# Patient Record
Sex: Female | Born: 1980 | Race: White | Hispanic: No | Marital: Single | Smoking: Never smoker
Health system: Southern US, Community
[De-identification: ages and names within clinical notes are randomized; demographics above are authoritative.]

## PROBLEM LIST (undated history)

## (undated) DIAGNOSIS — I10 Essential (primary) hypertension: Secondary | ICD-10-CM

## (undated) DIAGNOSIS — R7611 Nonspecific reaction to tuberculin skin test without active tuberculosis: Secondary | ICD-10-CM

## (undated) DIAGNOSIS — F32A Depression, unspecified: Secondary | ICD-10-CM

## (undated) DIAGNOSIS — J45909 Unspecified asthma, uncomplicated: Secondary | ICD-10-CM

## (undated) DIAGNOSIS — F329 Major depressive disorder, single episode, unspecified: Secondary | ICD-10-CM

## (undated) HISTORY — DX: Major depressive disorder, single episode, unspecified: F32.9

## (undated) HISTORY — DX: Nonspecific reaction to tuberculin skin test without active tuberculosis: R76.11

## (undated) HISTORY — DX: Unspecified asthma, uncomplicated: J45.909

## (undated) HISTORY — PX: NASAL SINUS SURGERY: SHX719

## (undated) HISTORY — DX: Depression, unspecified: F32.A

## (undated) HISTORY — DX: Essential (primary) hypertension: I10

## (undated) HISTORY — PX: LASIK: SHX215

---

## 2012-04-08 DIAGNOSIS — F419 Anxiety disorder, unspecified: Secondary | ICD-10-CM | POA: Insufficient documentation

## 2012-04-08 DIAGNOSIS — F4310 Post-traumatic stress disorder, unspecified: Secondary | ICD-10-CM | POA: Diagnosis present

## 2013-05-26 DIAGNOSIS — J342 Deviated nasal septum: Secondary | ICD-10-CM | POA: Insufficient documentation

## 2013-05-26 DIAGNOSIS — R430 Anosmia: Secondary | ICD-10-CM | POA: Insufficient documentation

## 2013-05-26 DIAGNOSIS — J3489 Other specified disorders of nose and nasal sinuses: Secondary | ICD-10-CM | POA: Insufficient documentation

## 2013-05-26 DIAGNOSIS — R0982 Postnasal drip: Secondary | ICD-10-CM | POA: Insufficient documentation

## 2013-06-25 DIAGNOSIS — J324 Chronic pansinusitis: Secondary | ICD-10-CM | POA: Insufficient documentation

## 2015-07-27 DIAGNOSIS — M47816 Spondylosis without myelopathy or radiculopathy, lumbar region: Secondary | ICD-10-CM | POA: Diagnosis not present

## 2015-07-27 DIAGNOSIS — G8929 Other chronic pain: Secondary | ICD-10-CM | POA: Diagnosis not present

## 2015-07-27 DIAGNOSIS — Z79891 Long term (current) use of opiate analgesic: Secondary | ICD-10-CM | POA: Diagnosis not present

## 2015-08-24 DIAGNOSIS — I10 Essential (primary) hypertension: Secondary | ICD-10-CM | POA: Diagnosis not present

## 2015-08-24 DIAGNOSIS — R079 Chest pain, unspecified: Secondary | ICD-10-CM | POA: Diagnosis not present

## 2015-08-24 DIAGNOSIS — R002 Palpitations: Secondary | ICD-10-CM | POA: Diagnosis not present

## 2015-09-16 DIAGNOSIS — F3340 Major depressive disorder, recurrent, in remission, unspecified: Secondary | ICD-10-CM | POA: Diagnosis not present

## 2015-09-29 DIAGNOSIS — I10 Essential (primary) hypertension: Secondary | ICD-10-CM | POA: Diagnosis not present

## 2015-09-29 DIAGNOSIS — R002 Palpitations: Secondary | ICD-10-CM | POA: Diagnosis not present

## 2015-09-30 DIAGNOSIS — I10 Essential (primary) hypertension: Secondary | ICD-10-CM | POA: Diagnosis not present

## 2015-09-30 DIAGNOSIS — J45909 Unspecified asthma, uncomplicated: Secondary | ICD-10-CM | POA: Diagnosis not present

## 2015-10-04 DIAGNOSIS — Z79891 Long term (current) use of opiate analgesic: Secondary | ICD-10-CM | POA: Diagnosis not present

## 2015-10-04 DIAGNOSIS — G8929 Other chronic pain: Secondary | ICD-10-CM | POA: Diagnosis not present

## 2015-10-04 DIAGNOSIS — M5382 Other specified dorsopathies, cervical region: Secondary | ICD-10-CM | POA: Diagnosis not present

## 2015-12-31 DIAGNOSIS — J029 Acute pharyngitis, unspecified: Secondary | ICD-10-CM | POA: Diagnosis not present

## 2016-01-31 DIAGNOSIS — Z7951 Long term (current) use of inhaled steroids: Secondary | ICD-10-CM | POA: Diagnosis not present

## 2016-01-31 DIAGNOSIS — F419 Anxiety disorder, unspecified: Secondary | ICD-10-CM | POA: Diagnosis not present

## 2016-01-31 DIAGNOSIS — J45909 Unspecified asthma, uncomplicated: Secondary | ICD-10-CM | POA: Diagnosis not present

## 2016-01-31 DIAGNOSIS — M1990 Unspecified osteoarthritis, unspecified site: Secondary | ICD-10-CM | POA: Diagnosis not present

## 2016-01-31 DIAGNOSIS — F329 Major depressive disorder, single episode, unspecified: Secondary | ICD-10-CM | POA: Diagnosis not present

## 2016-01-31 DIAGNOSIS — F4310 Post-traumatic stress disorder, unspecified: Secondary | ICD-10-CM | POA: Diagnosis not present

## 2016-01-31 DIAGNOSIS — Z87820 Personal history of traumatic brain injury: Secondary | ICD-10-CM | POA: Diagnosis not present

## 2016-01-31 DIAGNOSIS — Z91040 Latex allergy status: Secondary | ICD-10-CM | POA: Diagnosis not present

## 2016-01-31 DIAGNOSIS — R05 Cough: Secondary | ICD-10-CM | POA: Diagnosis not present

## 2016-01-31 DIAGNOSIS — Z79899 Other long term (current) drug therapy: Secondary | ICD-10-CM | POA: Diagnosis not present

## 2016-01-31 DIAGNOSIS — G8929 Other chronic pain: Secondary | ICD-10-CM | POA: Diagnosis not present

## 2016-01-31 DIAGNOSIS — M542 Cervicalgia: Secondary | ICD-10-CM | POA: Diagnosis not present

## 2016-01-31 DIAGNOSIS — I10 Essential (primary) hypertension: Secondary | ICD-10-CM | POA: Diagnosis not present

## 2016-01-31 DIAGNOSIS — J209 Acute bronchitis, unspecified: Secondary | ICD-10-CM | POA: Diagnosis not present

## 2016-01-31 DIAGNOSIS — M549 Dorsalgia, unspecified: Secondary | ICD-10-CM | POA: Diagnosis not present

## 2016-06-07 DIAGNOSIS — J029 Acute pharyngitis, unspecified: Secondary | ICD-10-CM | POA: Diagnosis not present

## 2016-08-16 DIAGNOSIS — J9601 Acute respiratory failure with hypoxia: Secondary | ICD-10-CM | POA: Diagnosis not present

## 2016-08-16 DIAGNOSIS — F419 Anxiety disorder, unspecified: Secondary | ICD-10-CM | POA: Diagnosis not present

## 2016-08-16 DIAGNOSIS — G92 Toxic encephalopathy: Secondary | ICD-10-CM | POA: Diagnosis not present

## 2016-08-16 DIAGNOSIS — G9340 Encephalopathy, unspecified: Secondary | ICD-10-CM | POA: Diagnosis not present

## 2016-08-16 DIAGNOSIS — G9341 Metabolic encephalopathy: Secondary | ICD-10-CM | POA: Diagnosis not present

## 2016-08-16 DIAGNOSIS — T493X1A Poisoning by emollients, demulcents and protectants, accidental (unintentional), initial encounter: Secondary | ICD-10-CM | POA: Diagnosis not present

## 2016-08-16 DIAGNOSIS — R4020 Unspecified coma: Secondary | ICD-10-CM | POA: Diagnosis not present

## 2016-08-16 DIAGNOSIS — F1020 Alcohol dependence, uncomplicated: Secondary | ICD-10-CM | POA: Diagnosis not present

## 2016-08-16 DIAGNOSIS — T40692A Poisoning by other narcotics, intentional self-harm, initial encounter: Secondary | ICD-10-CM | POA: Diagnosis not present

## 2016-08-16 DIAGNOSIS — Z915 Personal history of self-harm: Secondary | ICD-10-CM | POA: Diagnosis not present

## 2016-08-16 DIAGNOSIS — R451 Restlessness and agitation: Secondary | ICD-10-CM | POA: Diagnosis not present

## 2016-08-16 DIAGNOSIS — T40602A Poisoning by unspecified narcotics, intentional self-harm, initial encounter: Secondary | ICD-10-CM | POA: Diagnosis not present

## 2016-08-16 DIAGNOSIS — R000 Tachycardia, unspecified: Secondary | ICD-10-CM | POA: Diagnosis not present

## 2016-08-16 DIAGNOSIS — F1420 Cocaine dependence, uncomplicated: Secondary | ICD-10-CM | POA: Diagnosis not present

## 2016-08-16 DIAGNOSIS — T50904A Poisoning by unspecified drugs, medicaments and biological substances, undetermined, initial encounter: Secondary | ICD-10-CM | POA: Diagnosis not present

## 2016-08-16 DIAGNOSIS — I10 Essential (primary) hypertension: Secondary | ICD-10-CM | POA: Diagnosis not present

## 2016-08-16 DIAGNOSIS — J324 Chronic pansinusitis: Secondary | ICD-10-CM | POA: Diagnosis not present

## 2016-08-16 DIAGNOSIS — T405X1A Poisoning by cocaine, accidental (unintentional), initial encounter: Secondary | ICD-10-CM | POA: Diagnosis not present

## 2016-08-16 DIAGNOSIS — Z430 Encounter for attention to tracheostomy: Secondary | ICD-10-CM | POA: Diagnosis not present

## 2016-08-16 DIAGNOSIS — T50991A Poisoning by other drugs, medicaments and biological substances, accidental (unintentional), initial encounter: Secondary | ICD-10-CM | POA: Diagnosis not present

## 2016-08-16 DIAGNOSIS — J4520 Mild intermittent asthma, uncomplicated: Secondary | ICD-10-CM | POA: Diagnosis not present

## 2016-08-16 DIAGNOSIS — F10239 Alcohol dependence with withdrawal, unspecified: Secondary | ICD-10-CM | POA: Diagnosis not present

## 2016-08-16 DIAGNOSIS — T510X1A Toxic effect of ethanol, accidental (unintentional), initial encounter: Secondary | ICD-10-CM | POA: Diagnosis not present

## 2016-08-16 DIAGNOSIS — I998 Other disorder of circulatory system: Secondary | ICD-10-CM | POA: Diagnosis not present

## 2016-08-16 DIAGNOSIS — T50994A Poisoning by other drugs, medicaments and biological substances, undetermined, initial encounter: Secondary | ICD-10-CM | POA: Diagnosis not present

## 2016-08-16 DIAGNOSIS — G931 Anoxic brain damage, not elsewhere classified: Secondary | ICD-10-CM | POA: Diagnosis not present

## 2016-08-16 DIAGNOSIS — J9602 Acute respiratory failure with hypercapnia: Secondary | ICD-10-CM | POA: Diagnosis not present

## 2016-08-16 DIAGNOSIS — T507X4A Poisoning by analeptics and opioid receptor antagonists, undetermined, initial encounter: Secondary | ICD-10-CM | POA: Diagnosis not present

## 2016-08-16 DIAGNOSIS — F332 Major depressive disorder, recurrent severe without psychotic features: Secondary | ICD-10-CM | POA: Diagnosis not present

## 2016-08-16 DIAGNOSIS — F4310 Post-traumatic stress disorder, unspecified: Secondary | ICD-10-CM | POA: Diagnosis not present

## 2016-08-17 DIAGNOSIS — T40602A Poisoning by unspecified narcotics, intentional self-harm, initial encounter: Secondary | ICD-10-CM | POA: Diagnosis not present

## 2016-08-17 DIAGNOSIS — J4520 Mild intermittent asthma, uncomplicated: Secondary | ICD-10-CM | POA: Diagnosis not present

## 2016-08-17 DIAGNOSIS — F4310 Post-traumatic stress disorder, unspecified: Secondary | ICD-10-CM | POA: Diagnosis not present

## 2016-08-17 DIAGNOSIS — T50994A Poisoning by other drugs, medicaments and biological substances, undetermined, initial encounter: Secondary | ICD-10-CM | POA: Diagnosis not present

## 2016-08-17 DIAGNOSIS — J9601 Acute respiratory failure with hypoxia: Secondary | ICD-10-CM | POA: Diagnosis not present

## 2016-08-17 DIAGNOSIS — R000 Tachycardia, unspecified: Secondary | ICD-10-CM | POA: Diagnosis not present

## 2016-08-17 DIAGNOSIS — I998 Other disorder of circulatory system: Secondary | ICD-10-CM | POA: Diagnosis not present

## 2016-08-17 DIAGNOSIS — J9602 Acute respiratory failure with hypercapnia: Secondary | ICD-10-CM | POA: Diagnosis not present

## 2016-08-17 DIAGNOSIS — F1020 Alcohol dependence, uncomplicated: Secondary | ICD-10-CM | POA: Diagnosis not present

## 2016-08-17 DIAGNOSIS — R451 Restlessness and agitation: Secondary | ICD-10-CM | POA: Diagnosis not present

## 2016-08-17 DIAGNOSIS — Z915 Personal history of self-harm: Secondary | ICD-10-CM | POA: Diagnosis not present

## 2016-08-17 DIAGNOSIS — F1420 Cocaine dependence, uncomplicated: Secondary | ICD-10-CM | POA: Diagnosis not present

## 2016-08-17 DIAGNOSIS — F332 Major depressive disorder, recurrent severe without psychotic features: Secondary | ICD-10-CM | POA: Diagnosis not present

## 2016-08-18 DIAGNOSIS — F4310 Post-traumatic stress disorder, unspecified: Secondary | ICD-10-CM | POA: Diagnosis not present

## 2016-08-18 DIAGNOSIS — J9602 Acute respiratory failure with hypercapnia: Secondary | ICD-10-CM | POA: Diagnosis not present

## 2016-08-18 DIAGNOSIS — F1420 Cocaine dependence, uncomplicated: Secondary | ICD-10-CM | POA: Diagnosis not present

## 2016-08-18 DIAGNOSIS — T40602A Poisoning by unspecified narcotics, intentional self-harm, initial encounter: Secondary | ICD-10-CM | POA: Diagnosis not present

## 2016-08-18 DIAGNOSIS — F332 Major depressive disorder, recurrent severe without psychotic features: Secondary | ICD-10-CM | POA: Diagnosis not present

## 2016-08-18 DIAGNOSIS — J9601 Acute respiratory failure with hypoxia: Secondary | ICD-10-CM | POA: Diagnosis not present

## 2016-08-18 DIAGNOSIS — F1020 Alcohol dependence, uncomplicated: Secondary | ICD-10-CM | POA: Diagnosis not present

## 2016-08-19 DIAGNOSIS — J9602 Acute respiratory failure with hypercapnia: Secondary | ICD-10-CM | POA: Diagnosis not present

## 2016-08-19 DIAGNOSIS — T40602A Poisoning by unspecified narcotics, intentional self-harm, initial encounter: Secondary | ICD-10-CM | POA: Diagnosis not present

## 2016-08-19 DIAGNOSIS — J9601 Acute respiratory failure with hypoxia: Secondary | ICD-10-CM | POA: Diagnosis not present

## 2016-08-19 DIAGNOSIS — F4310 Post-traumatic stress disorder, unspecified: Secondary | ICD-10-CM | POA: Diagnosis not present

## 2016-08-20 DIAGNOSIS — G9341 Metabolic encephalopathy: Secondary | ICD-10-CM | POA: Diagnosis not present

## 2016-08-20 DIAGNOSIS — T50904A Poisoning by unspecified drugs, medicaments and biological substances, undetermined, initial encounter: Secondary | ICD-10-CM | POA: Diagnosis not present

## 2016-08-20 DIAGNOSIS — F1420 Cocaine dependence, uncomplicated: Secondary | ICD-10-CM | POA: Diagnosis not present

## 2016-08-20 DIAGNOSIS — F10239 Alcohol dependence with withdrawal, unspecified: Secondary | ICD-10-CM | POA: Diagnosis not present

## 2016-08-21 DIAGNOSIS — F1020 Alcohol dependence, uncomplicated: Secondary | ICD-10-CM | POA: Diagnosis not present

## 2016-08-21 DIAGNOSIS — J45909 Unspecified asthma, uncomplicated: Secondary | ICD-10-CM | POA: Diagnosis not present

## 2016-08-21 DIAGNOSIS — F1420 Cocaine dependence, uncomplicated: Secondary | ICD-10-CM | POA: Diagnosis not present

## 2016-08-21 DIAGNOSIS — G9340 Encephalopathy, unspecified: Secondary | ICD-10-CM | POA: Diagnosis not present

## 2016-08-21 DIAGNOSIS — T50902A Poisoning by unspecified drugs, medicaments and biological substances, intentional self-harm, initial encounter: Secondary | ICD-10-CM | POA: Diagnosis not present

## 2016-08-21 DIAGNOSIS — T50904A Poisoning by unspecified drugs, medicaments and biological substances, undetermined, initial encounter: Secondary | ICD-10-CM | POA: Diagnosis not present

## 2016-08-21 DIAGNOSIS — F4310 Post-traumatic stress disorder, unspecified: Secondary | ICD-10-CM | POA: Diagnosis not present

## 2016-08-21 DIAGNOSIS — F1010 Alcohol abuse, uncomplicated: Secondary | ICD-10-CM | POA: Diagnosis not present

## 2016-08-21 DIAGNOSIS — F332 Major depressive disorder, recurrent severe without psychotic features: Secondary | ICD-10-CM | POA: Diagnosis not present

## 2016-08-22 DIAGNOSIS — F4310 Post-traumatic stress disorder, unspecified: Secondary | ICD-10-CM | POA: Diagnosis not present

## 2016-08-22 DIAGNOSIS — F1420 Cocaine dependence, uncomplicated: Secondary | ICD-10-CM | POA: Diagnosis not present

## 2016-08-22 DIAGNOSIS — F332 Major depressive disorder, recurrent severe without psychotic features: Secondary | ICD-10-CM | POA: Diagnosis not present

## 2016-08-22 DIAGNOSIS — F1020 Alcohol dependence, uncomplicated: Secondary | ICD-10-CM | POA: Diagnosis not present

## 2016-08-23 DIAGNOSIS — F332 Major depressive disorder, recurrent severe without psychotic features: Secondary | ICD-10-CM | POA: Diagnosis not present

## 2016-08-23 DIAGNOSIS — F4310 Post-traumatic stress disorder, unspecified: Secondary | ICD-10-CM | POA: Diagnosis not present

## 2016-08-23 DIAGNOSIS — F1020 Alcohol dependence, uncomplicated: Secondary | ICD-10-CM | POA: Diagnosis not present

## 2016-08-23 DIAGNOSIS — F1420 Cocaine dependence, uncomplicated: Secondary | ICD-10-CM | POA: Diagnosis not present

## 2016-08-24 DIAGNOSIS — F1020 Alcohol dependence, uncomplicated: Secondary | ICD-10-CM | POA: Diagnosis not present

## 2016-08-24 DIAGNOSIS — F332 Major depressive disorder, recurrent severe without psychotic features: Secondary | ICD-10-CM | POA: Diagnosis not present

## 2016-08-24 DIAGNOSIS — F4310 Post-traumatic stress disorder, unspecified: Secondary | ICD-10-CM | POA: Diagnosis not present

## 2016-08-24 DIAGNOSIS — F1420 Cocaine dependence, uncomplicated: Secondary | ICD-10-CM | POA: Diagnosis not present

## 2016-08-25 DIAGNOSIS — F4310 Post-traumatic stress disorder, unspecified: Secondary | ICD-10-CM | POA: Diagnosis not present

## 2016-08-25 DIAGNOSIS — F1420 Cocaine dependence, uncomplicated: Secondary | ICD-10-CM | POA: Diagnosis not present

## 2016-08-25 DIAGNOSIS — F1020 Alcohol dependence, uncomplicated: Secondary | ICD-10-CM | POA: Diagnosis not present

## 2016-08-25 DIAGNOSIS — F332 Major depressive disorder, recurrent severe without psychotic features: Secondary | ICD-10-CM | POA: Diagnosis not present

## 2016-08-30 ENCOUNTER — Encounter: Payer: Self-pay | Admitting: Family

## 2016-08-30 ENCOUNTER — Ambulatory Visit (INDEPENDENT_AMBULATORY_CARE_PROVIDER_SITE_OTHER): Payer: Federal, State, Local not specified - PPO | Admitting: Family

## 2016-08-30 VITALS — BP 120/84 | HR 105 | Temp 98.3°F | Resp 16 | Ht 67.0 in | Wt 137.0 lb

## 2016-08-30 DIAGNOSIS — F324 Major depressive disorder, single episode, in partial remission: Secondary | ICD-10-CM | POA: Insufficient documentation

## 2016-08-30 DIAGNOSIS — I10 Essential (primary) hypertension: Secondary | ICD-10-CM

## 2016-08-30 DIAGNOSIS — F3341 Major depressive disorder, recurrent, in partial remission: Secondary | ICD-10-CM

## 2016-08-30 DIAGNOSIS — J4540 Moderate persistent asthma, uncomplicated: Secondary | ICD-10-CM

## 2016-08-30 MED ORDER — DULOXETINE HCL 60 MG PO CPEP: 60.0000 mg | ORAL_CAPSULE | Freq: Every day | ORAL | 0 refills | Status: DC

## 2016-08-30 MED ORDER — TRAZODONE HCL 50 MG PO TABS: 50.0000 mg | ORAL_TABLET | Freq: Every evening | ORAL | 0 refills | Status: DC | PRN

## 2016-08-30 MED ORDER — BUPROPION HCL ER (XL) 150 MG PO TB24: 150.0000 mg | ORAL_TABLET | Freq: Every day | ORAL | 0 refills | Status: DC

## 2016-08-30 MED ORDER — BUDESONIDE-FORMOTEROL FUMARATE 160-4.5 MCG/ACT IN AERO: 2.0000 | INHALATION_SPRAY | Freq: Two times a day (BID) | RESPIRATORY_TRACT | 3 refills | Status: DC

## 2016-08-30 MED ORDER — METOPROLOL SUCCINATE ER 25 MG PO TB24: 25.0000 mg | ORAL_TABLET | Freq: Every day | ORAL | 0 refills | Status: DC

## 2016-08-30 MED ORDER — PRAZOSIN HCL 2 MG PO CAPS: 2.0000 mg | ORAL_CAPSULE | Freq: Every day | ORAL | 0 refills | Status: DC

## 2016-08-30 NOTE — Patient Instructions (Signed)
Thank you for choosing [facility].  SUMMARY AND INSTRUCTIONS:  Please continue to take your medications as prescribed.   Schedule a time for your annual wellness exam.  Continue to monitor your blood pressures at home with the goal of average blood pressure less than 140/90.   Medication:  Your prescription(s) have been submitted to your pharmacy or been printed and provided for you. Please take as directed and contact our office if you believe you are having problem(s) with the medication(s) or have any questions.  Follow up:  If your symptoms worsen or fail to improve, please contact our office for further instruction, or in case of emergency go directly to the emergency room at the closest medical facility.

## 2016-08-30 NOTE — Assessment & Plan Note (Signed)
Major depression appears to be a partial remission following suicide attempt through overdose during most recent hospitalization. 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 No suicidal ideations or plans. Denies any hallucinations. Nightmares/dreams are adequately controlled with current dosage of Minipress. Continue current dosage of bupropion and duloxetine. Declines psychology at this time. Continue current dosage of trazodone as needed for sleep. Follow-up in 3 months or sooner if needed.

## 2016-08-30 NOTE — Assessment & Plan Note (Signed)
Blood pressure appears adequately controlled without metoprolol, however heart rate noted to be elevated. Most likely covered through prazosin to help control blood pressure. Continue current dosage of metoprolol for heart rate control as well as blood pressure. Encouraged to monitor blood pressure at home and follow low-sodium diet. Denies worst headache of life with no symptoms of end organ damage noted on physical exam.

## 2016-08-30 NOTE — Assessment & Plan Note (Signed)
Asthma stable with current medication regimen and no symptoms of exacerbation or recent flares. Continue current dosage of Symbicort and Singulair. Continue albuterol as needed. Follow-up if symptoms worsen or no longer well controlled.

## 2016-08-30 NOTE — Progress Notes (Signed)
Subjective:    Patient ID: Jordan Foster, female    DOB: February 24, 1981, 36 y.o.   MRN: [id number]  Chief Complaint  Patient presents with  . Establish Care    BP medication refills     HPI:  Jordan Foster is a 36 y.o. female who  has a past medical history of Asthma; Depression; Hypertension; and Positive TB test. and presents today for an office visit to establish care.   1.) Hypertension - Currently prescribed metoprolol. Has been out of the medication for the last five days since leaving the hospital. Denies worst headache of life or symptoms of end organ damage. Blood pressures at home have been well controlled without medication. Working on a low sodium intake.   BP Readings from Last 3 Encounters:  08/30/16 120/84     2.) Major Depressive Disorder - Currently managed with Cymbalta, bupropion, prazosin, and trazodone. Reports taking the medications as prescribed and denies adverse side effects. No current suicidal ideations although recently hospitalized for suicidal attempt by drug overdose. Remained sober since discharge from the hospital from alcohol and drugs. Mood is stable with current medication regimen and not currently followed by psychiatry.  3.) Asthma  - currently maintained on Symbicort, albuterol, and Singulair. Reports taking the medication as prescribed and denies adverse side effects. No current symptoms of exacerbation. No recent nighttime awakenings or need for oral steroids. Uses the albuterol inhaler approximately one time per month. Symptoms generally well controlled with current medication regimen. No shortness of breath, wheezing, or chest tightness.    Allergies  Allergen Reactions  . Latex       No outpatient prescriptions prior to visit.   No facility-administered medications prior to visit.      Past Medical History:  Diagnosis Date  . Asthma   . Depression   . Hypertension   . Positive TB test       Past Surgical  History:  Procedure Laterality Date  . LASIK    . NASAL SINUS SURGERY        Family History  Problem Relation Age of Onset  . Hypertension Mother       Social History   Social History  . Marital status: Single    Spouse name: N/A  . Number of children: 0  . Years of education: 4216   Occupational History  . [employer]     Social History Main Topics  . Smoking status: Never Smoker  . Smokeless tobacco: Never Used  . Alcohol use No  . Drug use: No  . Sexual activity: Not on file   Other Topics Concern  . Not on file   Social History Narrative   Fun/Hobby: Working to determine      Review of Systems  Constitutional: Negative for chills and fever.  Eyes:       Negative for changes in vision  Respiratory: Negative for cough, chest tightness, shortness of breath and wheezing.   Cardiovascular: Negative for chest pain, palpitations and leg swelling.  Neurological: Negative for dizziness, weakness and light-headedness.  Psychiatric/Behavioral: Negative for agitation, behavioral problems, decreased concentration, dysphoric mood, hallucinations, self-injury, sleep disturbance and suicidal ideas. The patient is not nervous/anxious and is not hyperactive.        Objective:    BP 120/84 (BP Location: Left Arm, Patient Position: Sitting, Cuff Size: Normal)   Pulse (!) 105   Temp 98.3 F (36.8 C) (Oral)   Resp 16   Ht 5\' 7"  (  1.702 m)   Wt 137 lb (62.1 kg)   SpO2 98%   BMI 21.46 kg/m  Nursing note and vital signs reviewed.  Physical Exam  Constitutional: She is oriented to person, place, and time. She appears well-developed and well-nourished. No distress.  Cardiovascular: Normal rate, regular rhythm, normal heart sounds and intact distal pulses.   Pulmonary/Chest: Effort normal and breath sounds normal.  Neurological: She is alert and oriented to person, place, and time.  Skin: Skin is warm and dry.  Psychiatric: She has a normal mood and affect. Her  behavior is normal. Judgment and thought content normal.        Assessment & Plan:   Problem List Items Addressed This Visit      Cardiovascular and Mediastinum   Hypertension - Primary    Blood pressure appears adequately controlled without metoprolol, however heart rate noted to be elevated. Most likely covered through prazosin to help control blood pressure. Continue current dosage of metoprolol for heart rate control as well as blood pressure. Encouraged to monitor blood pressure at home and follow low-sodium diet. Denies worst headache of life with no symptoms of end organ damage noted on physical exam.      Relevant Medications   prazosin (MINIPRESS) 2 MG capsule   metoprolol succinate (TOPROL-XL) 25 MG 24 hr tablet     Respiratory   Moderate persistent asthma without complication    Asthma stable with current medication regimen and no symptoms of exacerbation or recent flares. Continue current dosage of Symbicort and Singulair. Continue albuterol as needed. Follow-up if symptoms worsen or no longer well controlled.      Relevant Medications   albuterol (PROVENTIL HFA;VENTOLIN HFA) 108 (90 Base) MCG/ACT inhaler   montelukast (SINGULAIR) 10 MG tablet   budesonide-formoterol (SYMBICORT) 160-4.5 MCG/ACT inhaler     Other   Major depression in partial remission (HCC)    Major depression appears to be a partial remission following suicide attempt through overdose during most recent hospitalization. 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 No suicidal ideations or plans. Denies any hallucinations. Nightmares/dreams are adequately controlled with current dosage of Minipress. Continue current dosage of bupropion and duloxetine. Declines psychology at this time. Continue current dosage of trazodone as needed for sleep. Follow-up in 3 months or sooner if needed.      Relevant Medications   traZODone (DESYREL) 50 MG tablet   buPROPion (WELLBUTRIN XL) 150 MG 24 hr tablet    DULoxetine (CYMBALTA) 60 MG capsule       I have discontinued Ms. Goates's hydrochlorothiazide. I have also changed her buPROPion, prazosin, DULoxetine, and metoprolol succinate. Additionally, I am having her start on budesonide-formoterol and traZODone. Lastly, I am having her maintain her albuterol, cetirizine, Vitamin D3, and montelukast.   Meds ordered this encounter  Medications  . DISCONTD: buPROPion (WELLBUTRIN XL) 150 MG 24 hr tablet    Sig: Take 150 mg by mouth daily.  Marland Kitchen DISCONTD: prazosin (MINIPRESS) 2 MG capsule    Sig: Take 2 mg by mouth at bedtime.  Marland Kitchen DISCONTD: DULoxetine (CYMBALTA) 60 MG capsule    Sig: Take 60 mg by mouth daily.  Marland Kitchen DISCONTD: metoprolol succinate (TOPROL-XL) 25 MG 24 hr tablet    Sig: Take 25 mg by mouth daily.  Marland Kitchen albuterol (PROVENTIL HFA;VENTOLIN HFA) 108 (90 Base) MCG/ACT inhaler    Sig: Inhale into the lungs every 6 (six) hours as needed for wheezing or shortness of breath.  . cetirizine (ZYRTEC) 10 MG tablet  Sig: Take 10 mg by mouth daily.  . Cholecalciferol (VITAMIN D3) 1000 units CAPS    Sig: Take by mouth.  . DISCONTD: hydrochlorothiazide (HYDRODIURIL) 25 MG tablet    Sig: Take 25 mg by mouth daily.  . montelukast (SINGULAIR) 10 MG tablet    Sig: Take 10 mg by mouth at bedtime.  . budesonide-formoterol (SYMBICORT) 160-4.5 MCG/ACT inhaler    Sig: Inhale 2 puffs into the lungs 2 (two) times daily.    Dispense:  1 Inhaler    Refill:  3    Order Specific Question:   Supervising Provider    Answer:   Hillard Danker A [[id number]]  . traZODone (DESYREL) 50 MG tablet    Sig: Take 1 tablet (50 mg total) by mouth at bedtime as needed for sleep.    Dispense:  90 tablet    Refill:  0    Order Specific Question:   Supervising Provider    Answer:   Hillard Danker A [[id number]]  . buPROPion (WELLBUTRIN XL) 150 MG 24 hr tablet    Sig: Take 1 tablet (150 mg total) by mouth daily.    Dispense:  90 tablet    Refill:  0    Order Specific Question:    Supervising Provider    Answer:   Hillard Danker A [[id number]]  . prazosin (MINIPRESS) 2 MG capsule    Sig: Take 1 capsule (2 mg total) by mouth at bedtime.    Dispense:  90 capsule    Refill:  0    Order Specific Question:   Supervising Provider    Answer:   Hillard Danker A [[id number]]  . DULoxetine (CYMBALTA) 60 MG capsule    Sig: Take 1 capsule (60 mg total) by mouth daily.    Dispense:  90 capsule    Refill:  0    Order Specific Question:   Supervising Provider    Answer:   Hillard Danker A [[id number]]  . metoprolol succinate (TOPROL-XL) 25 MG 24 hr tablet    Sig: Take 1 tablet (25 mg total) by mouth daily.    Dispense:  90 tablet    Refill:  0    Order Specific Question:   Supervising Provider    Answer:   Hillard Danker A [[id number]]     Follow-up: Return in about 3 months (around 11/30/2016), or if symptoms worsen or fail to improve.  Jeanine Luz, FNP

## 2016-09-22 ENCOUNTER — Ambulatory Visit (INDEPENDENT_AMBULATORY_CARE_PROVIDER_SITE_OTHER): Payer: Federal, State, Local not specified - PPO | Admitting: Family

## 2016-09-22 ENCOUNTER — Encounter: Payer: Self-pay | Admitting: Family

## 2016-09-22 VITALS — BP 102/70 | HR 75 | Temp 97.6°F | Resp 16 | Ht 67.0 in | Wt 133.0 lb

## 2016-09-22 DIAGNOSIS — F3341 Major depressive disorder, recurrent, in partial remission: Secondary | ICD-10-CM | POA: Diagnosis not present

## 2016-09-22 MED ORDER — DULOXETINE HCL 30 MG PO CPEP: 30.0000 mg | ORAL_CAPSULE | Freq: Every day | ORAL | 0 refills | Status: DC

## 2016-09-22 NOTE — Assessment & Plan Note (Signed)
Symptoms of depression remain adequately controlled with no symptoms of suicidal ideations although room for improvement with current medication regimen. Increase Cymbalta. Continue current dosage of bupropion and trazodone. Follow-up in 3 months or sooner if needed.

## 2016-09-22 NOTE — Patient Instructions (Signed)
Thank you for choosing [facility].  SUMMARY AND INSTRUCTIONS:  Please increase the Cymbalta to 90 mg daily.  Continue to take your other medications as prescribed.   Medication:  Your prescription(s) have been submitted to your pharmacy or been printed and provided for you. Please take as directed and contact our office if you believe you are having problem(s) with the medication(s) or have any questions.  Follow up:  If your symptoms worsen or fail to improve, please contact our office for further instruction, or in case of emergency go directly to the emergency room at the closest medical facility.

## 2016-09-22 NOTE — Progress Notes (Signed)
Subjective:    Patient ID: Jordan Foster, female    DOB: 1980/10/23, 36 y.o.   MRN: [id number]  Chief Complaint  Patient presents with  . Follow-up    would like to increase cymbalta     HPI:  Jordan Foster is a 36 y.o. female who  has a past medical history of Asthma; Depression; Hypertension; and Positive TB test. and presents today for a follow up office visit.  Depression - Currently maintained on bupropion and duloxetine. Reports taken the medications as prescribed and denies adverse side effects. Symptoms have been improved since she was hospitalized, but feels there is some room for improvement. Sleeping with okay with the trazodone and getting 4-6 hours per night. No suicidal ideations. Does have a counselor follow up through the [place].     Allergies  Allergen Reactions  . Latex       Outpatient Medications Prior to Visit  Medication Sig Dispense Refill  . albuterol (PROVENTIL HFA;VENTOLIN HFA) 108 (90 Base) MCG/ACT inhaler Inhale into the lungs every 6 (six) hours as needed for wheezing or shortness of breath.    . budesonide-formoterol (SYMBICORT) 160-4.5 MCG/ACT inhaler Inhale 2 puffs into the lungs 2 (two) times daily. 1 Inhaler 3  . buPROPion (WELLBUTRIN XL) 150 MG 24 hr tablet Take 1 tablet (150 mg total) by mouth daily. 90 tablet 0  . cetirizine (ZYRTEC) 10 MG tablet Take 10 mg by mouth daily.    . Cholecalciferol (VITAMIN D3) 1000 units CAPS Take by mouth.    . DULoxetine (CYMBALTA) 60 MG capsule Take 1 capsule (60 mg total) by mouth daily. 90 capsule 0  . metoprolol succinate (TOPROL-XL) 25 MG 24 hr tablet Take 1 tablet (25 mg total) by mouth daily. 90 tablet 0  . montelukast (SINGULAIR) 10 MG tablet Take 10 mg by mouth at bedtime.    . prazosin (MINIPRESS) 2 MG capsule Take 1 capsule (2 mg total) by mouth at bedtime. 90 capsule 0  . traZODone (DESYREL) 50 MG tablet Take 1 tablet (50 mg total) by mouth at bedtime as needed for sleep. 90 tablet 0    No facility-administered medications prior to visit.     Review of Systems  Constitutional: Negative for chills and fever.  Respiratory: Negative for chest tightness and shortness of breath.   Psychiatric/Behavioral: Positive for dysphoric mood. Negative for behavioral problems, decreased concentration, hallucinations, self-injury, sleep disturbance and suicidal ideas. The patient is not nervous/anxious.       Objective:    BP 102/70 (BP Location: Left Arm, Patient Position: Sitting, Cuff Size: Normal)   Pulse 75   Temp 97.6 F (36.4 C) (Oral)   Resp 16   Ht 5\' 7"  (1.702 m)   Wt 133 lb (60.3 kg)   SpO2 98%   BMI 20.83 kg/m  Nursing note and vital signs reviewed.  Physical Exam  Constitutional: She is oriented to person, place, and time. She appears well-developed and well-nourished. No distress.  Cardiovascular: Normal rate, regular rhythm, normal heart sounds and intact distal pulses.   Pulmonary/Chest: Effort normal and breath sounds normal.  Neurological: She is alert and oriented to person, place, and time.  Skin: Skin is warm and dry.  Psychiatric: She has a normal mood and affect. Her behavior is normal. Judgment and thought content normal.       Assessment & Plan:   Problem List Items Addressed This Visit      Other   Major depression in partial remission (  HCC) - Primary    Symptoms of depression remain adequately controlled with no symptoms of suicidal ideations although room for improvement with current medication regimen. Increase Cymbalta. Continue current dosage of bupropion and trazodone. Follow-up in 3 months or sooner if needed.      Relevant Medications   DULoxetine (CYMBALTA) 30 MG capsule       I am having Ms. Jordan Foster start on DULoxetine. I am also having her maintain her albuterol, cetirizine, Vitamin D3, montelukast, budesonide-formoterol, traZODone, buPROPion, prazosin, DULoxetine, and metoprolol succinate.   Meds ordered this encounter   Medications  . DULoxetine (CYMBALTA) 30 MG capsule    Sig: Take 1 capsule (30 mg total) by mouth daily.    Dispense:  90 capsule    Refill:  0    Order Specific Question:   Supervising Provider    Answer:   Hillard DankerRAWFORD, ELIZABETH A [[id number]]     Follow-up: Return in about 3 months (around 12/23/2016), or if symptoms worsen or fail to improve.  Jeanine Luzalone, Gregory, FNP

## 2016-09-27 DIAGNOSIS — M546 Pain in thoracic spine: Secondary | ICD-10-CM | POA: Diagnosis not present

## 2016-09-27 DIAGNOSIS — M62830 Muscle spasm of back: Secondary | ICD-10-CM | POA: Diagnosis not present

## 2016-09-27 DIAGNOSIS — G44209 Tension-type headache, unspecified, not intractable: Secondary | ICD-10-CM | POA: Diagnosis not present

## 2016-09-27 DIAGNOSIS — M542 Cervicalgia: Secondary | ICD-10-CM | POA: Diagnosis not present

## 2016-10-02 DIAGNOSIS — M542 Cervicalgia: Secondary | ICD-10-CM | POA: Diagnosis not present

## 2016-10-02 DIAGNOSIS — G44209 Tension-type headache, unspecified, not intractable: Secondary | ICD-10-CM | POA: Diagnosis not present

## 2016-10-02 DIAGNOSIS — M546 Pain in thoracic spine: Secondary | ICD-10-CM | POA: Diagnosis not present

## 2016-10-02 DIAGNOSIS — M62830 Muscle spasm of back: Secondary | ICD-10-CM | POA: Diagnosis not present

## 2016-10-03 DIAGNOSIS — G44209 Tension-type headache, unspecified, not intractable: Secondary | ICD-10-CM | POA: Diagnosis not present

## 2016-10-03 DIAGNOSIS — M62830 Muscle spasm of back: Secondary | ICD-10-CM | POA: Diagnosis not present

## 2016-10-03 DIAGNOSIS — M546 Pain in thoracic spine: Secondary | ICD-10-CM | POA: Diagnosis not present

## 2016-10-03 DIAGNOSIS — M542 Cervicalgia: Secondary | ICD-10-CM | POA: Diagnosis not present

## 2016-11-08 ENCOUNTER — Other Ambulatory Visit: Payer: Self-pay | Admitting: Family

## 2016-11-22 DIAGNOSIS — R000 Tachycardia, unspecified: Secondary | ICD-10-CM | POA: Diagnosis not present

## 2016-11-22 DIAGNOSIS — J020 Streptococcal pharyngitis: Secondary | ICD-10-CM | POA: Diagnosis not present

## 2016-11-25 ENCOUNTER — Other Ambulatory Visit: Payer: Self-pay | Admitting: Family

## 2016-11-29 ENCOUNTER — Ambulatory Visit (INDEPENDENT_AMBULATORY_CARE_PROVIDER_SITE_OTHER): Payer: Federal, State, Local not specified - PPO | Admitting: Family

## 2016-11-29 ENCOUNTER — Encounter: Payer: Self-pay | Admitting: Family

## 2016-11-29 VITALS — BP 128/82 | HR 117 | Resp 16 | Ht 67.0 in | Wt 129.0 lb

## 2016-11-29 DIAGNOSIS — R200 Anesthesia of skin: Secondary | ICD-10-CM | POA: Diagnosis not present

## 2016-11-29 DIAGNOSIS — R202 Paresthesia of skin: Secondary | ICD-10-CM | POA: Diagnosis not present

## 2016-11-29 DIAGNOSIS — F3341 Major depressive disorder, recurrent, in partial remission: Secondary | ICD-10-CM | POA: Diagnosis not present

## 2016-11-29 MED ORDER — METOPROLOL SUCCINATE ER 25 MG PO TB24: 25.0000 mg | ORAL_TABLET | Freq: Every day | ORAL | 0 refills | Status: DC

## 2016-11-29 MED ORDER — DULOXETINE HCL 60 MG PO CPEP: 120.0000 mg | ORAL_CAPSULE | Freq: Every day | ORAL | 0 refills | Status: DC

## 2016-11-29 NOTE — Assessment & Plan Note (Signed)
Symptoms of depression appear adequately controlled with current regimen and no suicidal ideation. She does appear hyperactive and has been sleeping less which is concerning for relapse. Recommend counseling. Continue current dosage of trazodone, prazosin, buproprion, and duloxetine.

## 2016-11-29 NOTE — Progress Notes (Signed)
Subjective:    Patient ID: Jordan Foster, female    DOB: 03-22-1981, 36 y.o.   MRN: [id number]  Chief Complaint  Patient presents with  . Follow-up    HPI:  Amonie A Amie CritchleyBlackwell is a 36 y.o. female who  has a past medical history of Asthma; Depression; Hypertension; and Positive TB test. and presents today for an acute   1.) Depression - Currently maintained on Cymbalta, Prazosin, trazodone and burproprion. Reports that she has been taking 120 mg of Cymbalta daily and the other medications as prescribed and denies adverse side effects. Not sleeping very well averaging about 4 hours of sleep every 2 days. Denies suicidal ideations or psychotic features.   2.) Numbness and tingling - This is a new problem. Associated symptom of numbness and tingling going on in her left arm has been going on for a couple of months. Did have trauma related to her neck from the [employer]. Denies any modifying factors or attempted treatments that make it better or worse.   Allergies  Allergen Reactions  . Latex       Outpatient Medications Prior to Visit  Medication Sig Dispense Refill  . albuterol (PROVENTIL HFA;VENTOLIN HFA) 108 (90 Base) MCG/ACT inhaler Inhale into the lungs every 6 (six) hours as needed for wheezing or shortness of breath.    . budesonide-formoterol (SYMBICORT) 160-4.5 MCG/ACT inhaler Inhale 2 puffs into the lungs 2 (two) times daily. 1 Inhaler 3  . buPROPion (WELLBUTRIN XL) 150 MG 24 hr tablet Take 1 tablet (150 mg total) by mouth daily. 90 tablet 0  . cetirizine (ZYRTEC) 10 MG tablet Take 10 mg by mouth daily.    . Cholecalciferol (VITAMIN D3) 1000 units CAPS Take by mouth.    . montelukast (SINGULAIR) 10 MG tablet Take 10 mg by mouth at bedtime.    . prazosin (MINIPRESS) 2 MG capsule Take 1 capsule (2 mg total) by mouth at bedtime. 90 capsule 0  . traZODone (DESYREL) 50 MG tablet Take 1 tablet (50 mg total) by mouth at bedtime as needed for sleep. 90 tablet 0  .  DULoxetine (CYMBALTA) 30 MG capsule TAKE 1 CAPSULE BY MOUTH EVERY DAY 90 capsule 0  . DULoxetine (CYMBALTA) 60 MG capsule TAKE 1 CAPSULE BY MOUTH EVERY DAY 90 capsule 0  . metoprolol succinate (TOPROL-XL) 25 MG 24 hr tablet TAKE 1 TABLET BY MOUTH EVERY DAY 90 tablet 0   No facility-administered medications prior to visit.       Past Surgical History:  Procedure Laterality Date  . LASIK    . NASAL SINUS SURGERY        Past Medical History:  Diagnosis Date  . Asthma   . Depression   . Hypertension   . Positive TB test       Review of Systems  Constitutional: Negative for chills and fever.  Respiratory: Negative for chest tightness and shortness of breath.   Cardiovascular: Negative for chest pain, palpitations and leg swelling.  Psychiatric/Behavioral: Positive for sleep disturbance. Negative for agitation, decreased concentration, dysphoric mood, self-injury and suicidal ideas. The patient is not hyperactive.       Objective:    BP 128/82 (BP Location: Left Arm, Patient Position: Sitting, Cuff Size: Normal)   Pulse (!) 117   Resp 16   Ht 5\' 7"  (1.702 m)   Wt 129 lb (58.5 kg)   SpO2 98%   BMI 20.20 kg/m  Nursing note and vital signs reviewed.  Physical Exam  Constitutional: She is oriented to person, place, and time. She appears well-developed and well-nourished. No distress.  Cardiovascular: Normal rate, regular rhythm, normal heart sounds and intact distal pulses.   Pulmonary/Chest: Effort normal and breath sounds normal.  Neurological: She is alert and oriented to person, place, and time.  Skin: Skin is warm and dry.  Psychiatric: Thought content normal. Her mood appears anxious. Her affect is labile. She is hyperactive. Thought content is not paranoid and not delusional. She expresses impulsivity. She expresses no homicidal and no suicidal ideation.       Assessment & Plan:   Problem List Items Addressed This Visit      Other   Major depression in  partial remission (HCC) - Primary    Symptoms of depression appear adequately controlled with current regimen and no suicidal ideation. She does appear hyperactive and has been sleeping less which is concerning for relapse. Recommend counseling. Continue current dosage of trazodone, prazosin, buproprion, and duloxetine.       Relevant Medications   DULoxetine (CYMBALTA) 60 MG capsule   Numbness and tingling in left upper extremity    Previous injury in sustained in the [employer] and continues to have numbness and tingling. Has been seeing chiropractics with no significant improvements. Refer to orthopedics per patient request. Continue conservative treatment including ice, heat and home exercise therapy pending referral.       Relevant Orders   AMB referral to orthopedics       I have discontinued Ms. Danielsen's DULoxetine. I have also changed her DULoxetine and metoprolol succinate. Additionally, I am having her maintain her albuterol, cetirizine, Vitamin D3, montelukast, budesonide-formoterol, traZODone, buPROPion, and prazosin.   Meds ordered this encounter  Medications  . DULoxetine (CYMBALTA) 60 MG capsule    Sig: Take 2 capsules (120 mg total) by mouth daily.    Dispense:  180 capsule    Refill:  0    Order Specific Question:   Supervising Provider    Answer:   Hillard Danker A [[id number]]  . metoprolol succinate (TOPROL-XL) 25 MG 24 hr tablet    Sig: Take 1 tablet (25 mg total) by mouth daily.    Dispense:  90 tablet    Refill:  0    Order Specific Question:   Supervising Provider    Answer:   Hillard Danker A [[id number]]     Follow-up: Return in about 3 months (around 03/01/2017), or if symptoms worsen or fail to improve.  Jeanine Luz, FNP

## 2016-11-29 NOTE — Patient Instructions (Signed)
Thank you for choosing [facility].  SUMMARY AND INSTRUCTIONS:  Please continue to take your medication as prescribed.   Medication:  Your prescription(s) have been submitted to your pharmacy or been printed and provided for you. Please take as directed and contact our office if you believe you are having problem(s) with the medication(s) or have any questions.  Follow up:  If your symptoms worsen or fail to improve, please contact our office for further instruction, or in case of emergency go directly to the emergency room at the closest medical facility.     

## 2016-11-29 NOTE — Assessment & Plan Note (Signed)
Previous injury in sustained in the [employer] and continues to have numbness and tingling. Has been seeing chiropractics with no significant improvements. Refer to orthopedics per patient request. Continue conservative treatment including ice, heat and home exercise therapy pending referral.

## 2016-12-18 ENCOUNTER — Other Ambulatory Visit: Payer: Self-pay | Admitting: Family

## 2016-12-21 DIAGNOSIS — R000 Tachycardia, unspecified: Secondary | ICD-10-CM | POA: Diagnosis not present

## 2016-12-21 DIAGNOSIS — J020 Streptococcal pharyngitis: Secondary | ICD-10-CM | POA: Diagnosis not present

## 2016-12-26 ENCOUNTER — Encounter: Payer: Self-pay | Admitting: Family

## 2016-12-27 ENCOUNTER — Ambulatory Visit: Payer: Federal, State, Local not specified - PPO | Admitting: Family

## 2017-01-23 DIAGNOSIS — M542 Cervicalgia: Secondary | ICD-10-CM | POA: Diagnosis not present

## 2017-01-23 DIAGNOSIS — M545 Low back pain: Secondary | ICD-10-CM | POA: Diagnosis not present

## 2017-01-24 ENCOUNTER — Telehealth: Payer: Self-pay | Admitting: Family

## 2017-01-24 MED ORDER — DULOXETINE HCL 60 MG PO CPEP: 120.0000 mg | ORAL_CAPSULE | Freq: Every day | ORAL | 0 refills | Status: DC

## 2017-01-24 MED ORDER — METOPROLOL SUCCINATE ER 25 MG PO TB24: 25.0000 mg | ORAL_TABLET | Freq: Every day | ORAL | 0 refills | Status: DC

## 2017-01-24 NOTE — Telephone Encounter (Signed)
Pt called in and needs refill on her   Cymbalta  Metoprolol  [facility] on file

## 2017-01-24 NOTE — Telephone Encounter (Signed)
Medications have been sent in. 

## 2017-02-23 ENCOUNTER — Ambulatory Visit: Payer: Federal, State, Local not specified - PPO | Admitting: Nurse Practitioner

## 2017-03-18 ENCOUNTER — Other Ambulatory Visit: Payer: Self-pay | Admitting: Family

## 2017-03-22 ENCOUNTER — Other Ambulatory Visit: Payer: Self-pay | Admitting: Family

## 2017-06-05 DIAGNOSIS — I10 Essential (primary) hypertension: Secondary | ICD-10-CM | POA: Diagnosis not present

## 2017-06-05 DIAGNOSIS — J069 Acute upper respiratory infection, unspecified: Secondary | ICD-10-CM | POA: Diagnosis not present

## 2017-06-05 DIAGNOSIS — Z114 Encounter for screening for human immunodeficiency virus [HIV]: Secondary | ICD-10-CM | POA: Diagnosis not present

## 2017-06-06 DIAGNOSIS — M4696 Unspecified inflammatory spondylopathy, lumbar region: Secondary | ICD-10-CM | POA: Diagnosis not present

## 2017-06-06 DIAGNOSIS — M2241 Chondromalacia patellae, right knee: Secondary | ICD-10-CM | POA: Diagnosis not present

## 2017-06-06 DIAGNOSIS — M4802 Spinal stenosis, cervical region: Secondary | ICD-10-CM | POA: Diagnosis not present

## 2017-06-06 DIAGNOSIS — M25561 Pain in right knee: Secondary | ICD-10-CM | POA: Diagnosis not present

## 2017-06-15 DIAGNOSIS — Z113 Encounter for screening for infections with a predominantly sexual mode of transmission: Secondary | ICD-10-CM | POA: Diagnosis not present

## 2017-06-15 DIAGNOSIS — Z0389 Encounter for observation for other suspected diseases and conditions ruled out: Secondary | ICD-10-CM | POA: Diagnosis not present

## 2017-06-15 DIAGNOSIS — F329 Major depressive disorder, single episode, unspecified: Secondary | ICD-10-CM | POA: Diagnosis not present

## 2017-06-15 DIAGNOSIS — Z114 Encounter for screening for human immunodeficiency virus [HIV]: Secondary | ICD-10-CM | POA: Diagnosis not present

## 2017-08-02 DIAGNOSIS — F3340 Major depressive disorder, recurrent, in remission, unspecified: Secondary | ICD-10-CM | POA: Diagnosis not present

## 2017-08-15 DIAGNOSIS — F3340 Major depressive disorder, recurrent, in remission, unspecified: Secondary | ICD-10-CM | POA: Diagnosis not present

## 2017-08-30 DIAGNOSIS — F3340 Major depressive disorder, recurrent, in remission, unspecified: Secondary | ICD-10-CM | POA: Diagnosis not present

## 2017-09-07 DIAGNOSIS — F3340 Major depressive disorder, recurrent, in remission, unspecified: Secondary | ICD-10-CM | POA: Diagnosis not present

## 2017-09-19 DIAGNOSIS — F3340 Major depressive disorder, recurrent, in remission, unspecified: Secondary | ICD-10-CM | POA: Diagnosis not present

## 2017-09-27 DIAGNOSIS — F3340 Major depressive disorder, recurrent, in remission, unspecified: Secondary | ICD-10-CM | POA: Diagnosis not present

## 2017-10-11 DIAGNOSIS — F3340 Major depressive disorder, recurrent, in remission, unspecified: Secondary | ICD-10-CM | POA: Diagnosis not present

## 2017-10-18 DIAGNOSIS — F3340 Major depressive disorder, recurrent, in remission, unspecified: Secondary | ICD-10-CM | POA: Diagnosis not present

## 2017-10-22 DIAGNOSIS — F3340 Major depressive disorder, recurrent, in remission, unspecified: Secondary | ICD-10-CM | POA: Diagnosis not present

## 2017-11-01 DIAGNOSIS — F3340 Major depressive disorder, recurrent, in remission, unspecified: Secondary | ICD-10-CM | POA: Diagnosis not present

## 2017-11-15 DIAGNOSIS — F3340 Major depressive disorder, recurrent, in remission, unspecified: Secondary | ICD-10-CM | POA: Diagnosis not present

## 2017-11-23 DIAGNOSIS — F3340 Major depressive disorder, recurrent, in remission, unspecified: Secondary | ICD-10-CM | POA: Diagnosis not present

## 2017-11-29 DIAGNOSIS — F3340 Major depressive disorder, recurrent, in remission, unspecified: Secondary | ICD-10-CM | POA: Diagnosis not present

## 2017-12-03 DIAGNOSIS — F3340 Major depressive disorder, recurrent, in remission, unspecified: Secondary | ICD-10-CM | POA: Diagnosis not present

## 2017-12-13 DIAGNOSIS — F3340 Major depressive disorder, recurrent, in remission, unspecified: Secondary | ICD-10-CM | POA: Diagnosis not present

## 2017-12-26 DIAGNOSIS — F3340 Major depressive disorder, recurrent, in remission, unspecified: Secondary | ICD-10-CM | POA: Diagnosis not present

## 2018-01-02 DIAGNOSIS — F3340 Major depressive disorder, recurrent, in remission, unspecified: Secondary | ICD-10-CM | POA: Diagnosis not present

## 2018-01-10 DIAGNOSIS — J029 Acute pharyngitis, unspecified: Secondary | ICD-10-CM | POA: Diagnosis not present

## 2018-01-10 DIAGNOSIS — J209 Acute bronchitis, unspecified: Secondary | ICD-10-CM | POA: Diagnosis not present

## 2018-01-21 DIAGNOSIS — F3340 Major depressive disorder, recurrent, in remission, unspecified: Secondary | ICD-10-CM | POA: Diagnosis not present

## 2018-01-23 DIAGNOSIS — S60221A Contusion of right hand, initial encounter: Secondary | ICD-10-CM | POA: Diagnosis not present

## 2018-01-29 DIAGNOSIS — F3340 Major depressive disorder, recurrent, in remission, unspecified: Secondary | ICD-10-CM | POA: Diagnosis not present

## 2018-01-31 DIAGNOSIS — F3340 Major depressive disorder, recurrent, in remission, unspecified: Secondary | ICD-10-CM | POA: Diagnosis not present

## 2018-02-07 DIAGNOSIS — F3340 Major depressive disorder, recurrent, in remission, unspecified: Secondary | ICD-10-CM | POA: Diagnosis not present

## 2018-02-14 DIAGNOSIS — S60221D Contusion of right hand, subsequent encounter: Secondary | ICD-10-CM | POA: Diagnosis not present

## 2018-02-14 DIAGNOSIS — F3340 Major depressive disorder, recurrent, in remission, unspecified: Secondary | ICD-10-CM | POA: Diagnosis not present

## 2018-02-26 DIAGNOSIS — F3340 Major depressive disorder, recurrent, in remission, unspecified: Secondary | ICD-10-CM | POA: Diagnosis not present

## 2018-03-04 DIAGNOSIS — F3340 Major depressive disorder, recurrent, in remission, unspecified: Secondary | ICD-10-CM | POA: Diagnosis not present

## 2018-03-18 DIAGNOSIS — F3340 Major depressive disorder, recurrent, in remission, unspecified: Secondary | ICD-10-CM | POA: Diagnosis not present

## 2018-03-28 DIAGNOSIS — F3340 Major depressive disorder, recurrent, in remission, unspecified: Secondary | ICD-10-CM | POA: Diagnosis not present

## 2018-05-06 DIAGNOSIS — F3340 Major depressive disorder, recurrent, in remission, unspecified: Secondary | ICD-10-CM | POA: Diagnosis not present

## 2018-06-07 DIAGNOSIS — I10 Essential (primary) hypertension: Secondary | ICD-10-CM | POA: Diagnosis not present

## 2018-06-07 DIAGNOSIS — K649 Unspecified hemorrhoids: Secondary | ICD-10-CM | POA: Diagnosis not present

## 2018-06-26 DIAGNOSIS — H6981 Other specified disorders of Eustachian tube, right ear: Secondary | ICD-10-CM | POA: Diagnosis not present

## 2018-09-11 DIAGNOSIS — G8921 Chronic pain due to trauma: Secondary | ICD-10-CM | POA: Diagnosis not present

## 2018-09-11 DIAGNOSIS — R7989 Other specified abnormal findings of blood chemistry: Secondary | ICD-10-CM | POA: Insufficient documentation

## 2018-09-11 DIAGNOSIS — M79674 Pain in right toe(s): Secondary | ICD-10-CM | POA: Diagnosis not present

## 2018-09-11 DIAGNOSIS — M25552 Pain in left hip: Secondary | ICD-10-CM | POA: Diagnosis not present

## 2018-09-12 DIAGNOSIS — M25552 Pain in left hip: Secondary | ICD-10-CM | POA: Diagnosis not present

## 2018-10-02 DIAGNOSIS — M79674 Pain in right toe(s): Secondary | ICD-10-CM | POA: Diagnosis not present

## 2018-10-02 DIAGNOSIS — M25552 Pain in left hip: Secondary | ICD-10-CM | POA: Diagnosis not present

## 2018-10-07 DIAGNOSIS — F3340 Major depressive disorder, recurrent, in remission, unspecified: Secondary | ICD-10-CM | POA: Diagnosis not present

## 2018-10-07 DIAGNOSIS — M19071 Primary osteoarthritis, right ankle and foot: Secondary | ICD-10-CM | POA: Diagnosis not present

## 2018-10-30 DIAGNOSIS — F3340 Major depressive disorder, recurrent, in remission, unspecified: Secondary | ICD-10-CM | POA: Diagnosis not present

## 2019-01-29 DIAGNOSIS — F3340 Major depressive disorder, recurrent, in remission, unspecified: Secondary | ICD-10-CM | POA: Diagnosis not present

## 2019-03-06 DIAGNOSIS — F3340 Major depressive disorder, recurrent, in remission, unspecified: Secondary | ICD-10-CM | POA: Diagnosis not present

## 2019-04-06 DIAGNOSIS — Z8709 Personal history of other diseases of the respiratory system: Secondary | ICD-10-CM | POA: Diagnosis not present

## 2019-04-06 DIAGNOSIS — R070 Pain in throat: Secondary | ICD-10-CM | POA: Diagnosis not present

## 2019-07-08 ENCOUNTER — Emergency Department (HOSPITAL_COMMUNITY): Payer: Federal, State, Local not specified - PPO

## 2019-07-08 ENCOUNTER — Encounter (HOSPITAL_COMMUNITY): Payer: Self-pay

## 2019-07-08 ENCOUNTER — Emergency Department (HOSPITAL_COMMUNITY)
Admission: EM | Admit: 2019-07-08 | Discharge: 2019-07-08 | Disposition: A | Discharge status: Home / Self Care | Payer: Federal, State, Local not specified - PPO | Attending: Emergency Medicine | Admitting: Emergency Medicine

## 2019-07-08 ENCOUNTER — Other Ambulatory Visit: Payer: Self-pay

## 2019-07-08 DIAGNOSIS — Z79899 Other long term (current) drug therapy: Secondary | ICD-10-CM | POA: Insufficient documentation

## 2019-07-08 DIAGNOSIS — J45909 Unspecified asthma, uncomplicated: Secondary | ICD-10-CM | POA: Diagnosis not present

## 2019-07-08 DIAGNOSIS — R071 Chest pain on breathing: Secondary | ICD-10-CM

## 2019-07-08 DIAGNOSIS — I10 Essential (primary) hypertension: Secondary | ICD-10-CM | POA: Diagnosis not present

## 2019-07-08 DIAGNOSIS — R079 Chest pain, unspecified: Secondary | ICD-10-CM | POA: Diagnosis present

## 2019-07-08 LAB — BASIC METABOLIC PANEL
Anion gap: 12 (ref 5–15)
BUN: 10 mg/dL (ref 6–20)
CO2: 24 mmol/L (ref 22–32)
Calcium: 9.4 mg/dL (ref 8.9–10.3)
Chloride: 109 mmol/L (ref 98–111)
Creatinine, Ser: 0.98 mg/dL (ref 0.44–1.00)
GFR calc Af Amer: >60 mL/min (ref >60)
GFR calc non Af Amer: >60 mL/min (ref >60)
Glucose, Bld: 113 mg/dL — ABNORMAL HIGH (ref 70–99)
Potassium: 3.5 mmol/L (ref 3.5–5.1)
Sodium: 145 mmol/L (ref 135–145)

## 2019-07-08 LAB — CBC
HCT: 41.5 % (ref 36.0–46.0)
Hemoglobin: 13.7 g/dL (ref 12.0–15.0)
MCH: 32 pg (ref 26.0–34.0)
MCHC: 33 g/dL (ref 30.0–36.0)
MCV: 97 fL (ref 80.0–100.0)
Platelets: 257 10*3/uL (ref 150–400)
RBC: 4.28 MIL/uL (ref 3.87–5.11)
RDW: 13.5 % (ref 11.5–15.5)
WBC: 6.7 10*3/uL (ref 4.0–10.5)
nRBC: 0 % (ref 0.0–0.2)

## 2019-07-08 LAB — TROPONIN I (HIGH SENSITIVITY)
Troponin I (High Sensitivity): 2 ng/L (ref <18)
Troponin I (High Sensitivity): <2 ng/L (ref <18)

## 2019-07-08 LAB — I-STAT BETA HCG BLOOD, ED (MC, WL, AP ONLY): I-stat hCG, quantitative: <5 m[IU]/mL (ref <5)

## 2019-07-08 LAB — D-DIMER, QUANTITATIVE: D-Dimer, Quant: 1.35 ug/mL-FEU — ABNORMAL HIGH (ref 0.00–0.50)

## 2019-07-08 MED ORDER — IOHEXOL 350 MG/ML SOLN
100.0000 mL | Freq: Once | INTRAVENOUS | Status: AC | PRN
  Administered 2019-07-08: 100 mL via INTRAVENOUS

## 2019-07-08 MED ORDER — SODIUM CHLORIDE 0.9 % IV BOLUS
1000.0000 mL | Freq: Once | INTRAVENOUS | Status: AC
  Administered 2019-07-08: 1000 mL via INTRAVENOUS

## 2019-07-08 MED ORDER — SODIUM CHLORIDE 0.9% FLUSH
3.0000 mL | Freq: Once | INTRAVENOUS | Status: AC
  Administered 2019-07-08: 3 mL via INTRAVENOUS

## 2019-07-08 MED ORDER — SODIUM CHLORIDE (PF) 0.9 % IJ SOLN
INTRAMUSCULAR | Status: AC
  Filled 2019-07-08: qty 50

## 2019-07-08 NOTE — ED Triage Notes (Signed)
Left sided chest pain "feels like an electrical shock" x 3 days radiating to left arm.

## 2019-07-08 NOTE — ED Notes (Addendum)
Pt upon entering triage made aware that her girlfriend was not able to come back with her due to visitor policy. Pt states " Oh no fuck that. If she can't come back then I am not staying here. It's her fault that I am even here." Pt made aware by staff that if she is hurting that badly then she needs to be checked out but if she wanted to leave that she had the right to do so. Pt states " fuck Covid this is stupid." Pt allows staff to begin triage assessment, vitals, and EKG.

## 2019-07-08 NOTE — ED Provider Notes (Signed)
[facility]-EMERGENCY DEPT Provider Note  CSN: [id number] Arrival date & time: 07/08/19 0303   Chief Complaint(s) Chest Pain  HPI Jordan Foster is a 39 y.o. female with a past medical history listed below who presents to the emergency department with 3 days of constant and fluctuating pain to the left side of the chest described as sharp shooting pains that have electrical shocks going across her left chest and down her left arm.  These have been fluctuating every 5 to 10 minutes and worse with deep breathing.  Patient denies any associated shortness of breath.  No recent fevers or infections.  No cough or congestion.  No nausea or vomiting.  No abdominal pain or diarrhea.  Patient denies any prior history of DVT/PEs.  No OCP use.  No recent travel.  No trauma.  Patient works as a [employer] manager.  Reports having a history of tachycardia and having had Holter monitors in the past.  HPI  Past Medical History Past Medical History:  Diagnosis Date  . Asthma   . Depression   . Hypertension   . Positive TB test    Patient Active Problem List   Diagnosis Date Noted  . Numbness and tingling in left upper extremity 11/29/2016  . Hypertension 08/30/2016  . Major depression in partial remission (HCC) 08/30/2016  . Moderate persistent asthma without complication 08/30/2016   Home Medication(s) Prior to Admission medications   Medication Sig Start Date End Date Taking? Authorizing Provider  albuterol (PROVENTIL HFA;VENTOLIN HFA) 108 (90 Base) MCG/ACT inhaler Inhale into the lungs every 6 (six) hours as needed for wheezing or shortness of breath.   Yes [provider]  buPROPion (WELLBUTRIN XL) 300 MG 24 hr tablet Take 300 mg by mouth daily.   Yes [provider]  LORazepam (ATIVAN) 0.5 MG tablet Take 0.25-0.5 mg by mouth daily as needed for anxiety.  03/06/19  Yes [provider]  losartan (COZAAR) 25 MG tablet Take 25 mg by  mouth daily. 06/27/19  Yes [provider]  budesonide-formoterol (SYMBICORT) 160-4.5 MCG/ACT inhaler Inhale 2 puffs into the lungs 2 (two) times daily. Patient not taking: Reported on 07/08/2019 08/30/16   Veryl Speak, FNP  buPROPion (WELLBUTRIN XL) 150 MG 24 hr tablet TAKE 1 TABLET BY MOUTH EVERY DAY Patient not taking: Reported on 07/08/2019 12/18/16   Veryl Speak, FNP  DULoxetine (CYMBALTA) 60 MG capsule Take 2 capsules (120 mg total) by mouth daily. Patient not taking: Reported on 07/08/2019 01/24/17   Veryl Speak, FNP  metoprolol succinate (TOPROL-XL) 25 MG 24 hr tablet Take 1 tablet (25 mg total) by mouth daily. Patient not taking: Reported on 07/08/2019 01/24/17   Veryl Speak, FNP  prazosin (MINIPRESS) 2 MG capsule TAKE 1 CAPSULE (2 MG TOTAL) BY MOUTH AT BEDTIME. Patient not taking: Reported on 07/08/2019 12/18/16   Veryl Speak, FNP  traZODone (DESYREL) 50 MG tablet TAKE 1 TABLET (50 MG TOTAL) BY MOUTH AT BEDTIME AS NEEDED FOR SLEEP. Patient not taking: Reported on 07/08/2019 12/18/16   Veryl Speak, FNP  Past Surgical History Past Surgical History:  Procedure Laterality Date  . LASIK    . NASAL SINUS SURGERY     Family History Family History  Problem Relation Age of Onset  . Hypertension Mother     Social History Social History   Tobacco Use  . Smoking status: Never Smoker  . Smokeless tobacco: Never Used  Substance Use Topics  . Alcohol use: No  . Drug use: No   Allergies Latex  Review of Systems Review of Systems All other systems are reviewed and are negative for acute change except as noted in the HPI  Physical Exam Vital Signs  I have reviewed the triage vital signs BP 133/86   Pulse (!) 106   Temp 98.1 F (36.7 C) (Oral)   Resp 17   Ht 5\' 7"  (1.702 m)   Wt 68 kg   SpO2 99%   BMI 23.49 kg/m     Physical Exam Vitals reviewed.  Constitutional:      General: She is not in acute distress.    Appearance: She is well-developed. She is not diaphoretic.  HENT:     Head: Normocephalic and atraumatic.     Nose: Nose normal.  Eyes:     General: No scleral icterus.       Right eye: No discharge.        Left eye: No discharge.     Conjunctiva/sclera: Conjunctivae normal.     Pupils: Pupils are equal, round, and reactive to light.  Cardiovascular:     Rate and Rhythm: Regular rhythm. Tachycardia present.     Heart sounds: No murmur. No friction rub. No gallop.   Pulmonary:     Effort: Pulmonary effort is normal. No respiratory distress.     Breath sounds: Normal breath sounds. No stridor. No rales.    Chest:     Chest wall: Tenderness present.    Abdominal:     General: There is no distension.     Palpations: Abdomen is soft.     Tenderness: There is no abdominal tenderness.  Musculoskeletal:        General: No tenderness.     Cervical back: Normal range of motion and neck supple.  Skin:    General: Skin is warm and dry.     Findings: No erythema or rash.  Neurological:     Mental Status: She is alert and oriented to person, place, and time.     ED Results and Treatments Labs (all labs ordered are listed, but only abnormal results are displayed) Labs Reviewed  BASIC METABOLIC PANEL - Abnormal; Notable for the following components:      Result Value   Glucose, Bld 113 (*)    All other components within normal limits  D-DIMER, QUANTITATIVE (NOT AT [facility]) - Abnormal; Notable for the following components:   D-Dimer, Quant 1.35 (*)    All other components within normal limits  CBC  I-STAT BETA HCG BLOOD, ED ([facility], [facility], [facility] ONLY)  TROPONIN I (HIGH SENSITIVITY)  TROPONIN I (HIGH SENSITIVITY)  EKG  EKG Interpretation  Date/Time:  Tuesday July 08 2019  03:14:16 EDT Ventricular Rate:  122 PR Interval:    QRS Duration: 95 QT Interval:  337 QTC Calculation: 481 R Axis:   97 Text Interpretation: Sinus tachycardia Atrial premature complex Borderline right axis deviation Minimal ST depression, diffuse leads No old tracing to compare Confirmed by Drema Pry [phone]) on 07/08/2019 3:31:50 AM      Radiology DG Chest 2 View  Result Date: 07/08/2019 CLINICAL DATA:  Chest pain, left upper chest pain for 2 days, worse at night, patient agitated EXAM: CHEST - 2 VIEW COMPARISON:  None FINDINGS: No consolidation, features of edema, pneumothorax, or effusion. Pulmonary vascularity is normally distributed. The cardiomediastinal contours are unremarkable. No acute osseous or soft tissue abnormality. IMPRESSION: No acute cardiopulmonary abnormality. Electronically Signed   By: Kreg Shropshire M.D.   On: 07/08/2019 03:35   CT Angio Chest PE W and/or Wo Contrast  Result Date: 07/08/2019 CLINICAL DATA:  Left chest pain radiating to left arm for 3 days EXAM: CT ANGIOGRAPHY CHEST WITH CONTRAST TECHNIQUE: Multidetector CT imaging of the chest was performed using the standard protocol during bolus administration of intravenous contrast. Multiplanar CT image reconstructions and MIPs were obtained to evaluate the vascular anatomy. CONTRAST:  OMNIPAQUE IOHEXOL 350 MG/ML SOLN COMPARISON:  Chest radiograph 07/08/2019 FINDINGS: Cardiovascular: Satisfactory opacification of the pulmonary arteries. Extensive respiratory motion artifact may limit evaluation beyond the lobar level. No central or lobar pulmonary artery filling defects are seen. The aortic root is suboptimally assessed given cardiac pulsation artifact. The aorta is normal caliber. No intramural hematoma, dissection flap or other acute luminal abnormality of the aorta is seen. No periaortic stranding or hemorrhage. Normal heart size. No pericardial effusion. Mediastinum/Nodes: There is a patulous and fluid-filled  thoracic esophagus which could correlate for reflux. No mediastinal fluid or air. No concerning mediastinal, hilar or axillary adenopathy. No acute abnormality of the trachea. Thyroid gland and thoracic inlet are unremarkable. Lungs/Pleura: Limited evaluation of the lung parenchyma given extensive respiratory motion artifact. No consolidation, features of edema, pneumothorax, or effusion. Dependent atelectatic changes seen posteriorly. No discernible suspicious pulmonary nodules or masses. Upper Abdomen: No acute abnormalities present in the visualized portions of the upper abdomen. Musculoskeletal: No acute or suspicious osseous lesion within the limitations of respiratory motion artifact and resulting step artifact of the ribs. Chest wall is unremarkable. Included portions of the shoulders are unremarkable. Review of the MIP images confirms the above findings. IMPRESSION: 1. No evidence of acute central or lobar pulmonary embolism. More distal evaluation limited by respiratory motion artifact. 2. Patulous and fluid-filled thoracic esophagus, could correlate for reflux. 3. No other acute thoracic abnormality to provide cause for patient's radiating left chest and upper extremity pain. Electronically Signed   By: Kreg Shropshire M.D.   On: 07/08/2019 07:01    Pertinent labs & imaging results that were available during my care of the patient were reviewed by me and considered in my medical decision making (see chart for details).  Medications Ordered in ED Medications  sodium chloride flush (NS) 0.9 % injection 3 mL (3 mLs Intravenous Given 07/08/19 0643)  iohexol (OMNIPAQUE) 350 MG/ML injection 100 mL (100 mLs Intravenous Contrast Given 07/08/19 0635)  sodium chloride (PF) 0.9 % injection (  Given 07/08/19 0655)  sodium chloride 0.9 % bolus 1,000 mL (1,000 mLs Intravenous New Bag/Given 07/08/19 0655)  Procedures Procedures  (including critical care time)  Medical Decision Making / ED Course I have reviewed the nursing notes for this encounter and the patient's prior records (if available in EHR or on provided paperwork).   Jordan Foster was evaluated in Emergency Department on 07/08/2019 for the symptoms described in the history of present illness. She was evaluated in the context of the global COVID-19 pandemic, which necessitated consideration that the patient might be at risk for infection with the SARS-CoV-2 virus that causes COVID-19. Institutional protocols and algorithms that pertain to the evaluation of patients at risk for COVID-19 are in a state of rapid change based on information released by regulatory bodies including the CDC and federal and state organizations. These policies and algorithms were followed during the patient's care in the ED.  Atypical chest pain.  Inconsistent with ACS.  EKG with sinus tachycardia without ischemic changes or evidence of pericarditis. HEART score 1.  Troponin negative x2 ruling out ACS  Given pleurisy with tachycardia, dimer was obtained, which was elevated. CTA w/o PE or PTx.  Presentation not classic for aortic dissection or esophageal perforation. Chest x-ray without evidence suggestive of pneumonia, pneumothorax, pneumomediastinum.  No abnormal contour of the mediastinum to suggest dissection. No evidence of acute injuries.      Final Clinical Impression(s) / ED Diagnoses Final diagnoses:  Chest pain on breathing   The patient appears reasonably screened and/or stabilized for discharge and I doubt any other medical condition or other [facility] requiring further screening, evaluation, or treatment in the ED at this time prior to discharge. Safe for discharge with strict return precautions.  Disposition: Discharge  Condition: Good  I have discussed the results, Dx and Tx plan with the patient/family who expressed  understanding and agree(s) with the plan. Discharge instructions discussed at length. The patient/family was given strict return precautions who verbalized understanding of the instructions. No further questions at time of discharge.    ED Discharge Orders    None       Follow Up: Tomasa Hose, NP [place] [phone]  Schedule an appointment as soon as possible for a visit  As needed      This chart was dictated using voice recognition software.  Despite best efforts to proofread,  errors can occur which can change the documentation meaning.   Fatima Blank, MD 07/08/19 [phone]

## 2019-07-21 DIAGNOSIS — M9905 Segmental and somatic dysfunction of pelvic region: Secondary | ICD-10-CM | POA: Insufficient documentation

## 2019-07-21 DIAGNOSIS — M25559 Pain in unspecified hip: Secondary | ICD-10-CM | POA: Insufficient documentation

## 2019-07-21 DIAGNOSIS — X58XXXA Exposure to other specified factors, initial encounter: Secondary | ICD-10-CM | POA: Insufficient documentation

## 2019-07-21 DIAGNOSIS — S6990XA Unspecified injury of unspecified wrist, hand and finger(s), initial encounter: Secondary | ICD-10-CM | POA: Insufficient documentation

## 2019-07-21 DIAGNOSIS — D649 Anemia, unspecified: Secondary | ICD-10-CM | POA: Insufficient documentation

## 2019-07-21 DIAGNOSIS — J309 Allergic rhinitis, unspecified: Secondary | ICD-10-CM | POA: Insufficient documentation

## 2019-07-21 DIAGNOSIS — S239XXA Sprain of unspecified parts of thorax, initial encounter: Secondary | ICD-10-CM | POA: Insufficient documentation

## 2019-07-21 DIAGNOSIS — M542 Cervicalgia: Secondary | ICD-10-CM | POA: Insufficient documentation

## 2019-07-21 DIAGNOSIS — M5430 Sciatica, unspecified side: Secondary | ICD-10-CM | POA: Insufficient documentation

## 2019-07-21 DIAGNOSIS — N870 Mild cervical dysplasia: Secondary | ICD-10-CM | POA: Insufficient documentation

## 2019-07-21 DIAGNOSIS — M419 Scoliosis, unspecified: Secondary | ICD-10-CM | POA: Insufficient documentation

## 2019-07-21 DIAGNOSIS — M129 Arthropathy, unspecified: Secondary | ICD-10-CM | POA: Insufficient documentation

## 2019-07-21 DIAGNOSIS — J45909 Unspecified asthma, uncomplicated: Secondary | ICD-10-CM | POA: Insufficient documentation

## 2019-07-21 DIAGNOSIS — G5620 Lesion of ulnar nerve, unspecified upper limb: Secondary | ICD-10-CM | POA: Insufficient documentation

## 2019-07-21 DIAGNOSIS — M6740 Ganglion, unspecified site: Secondary | ICD-10-CM | POA: Insufficient documentation

## 2019-07-21 DIAGNOSIS — N63 Unspecified lump in unspecified breast: Secondary | ICD-10-CM | POA: Insufficient documentation

## 2019-07-21 DIAGNOSIS — R69 Illness, unspecified: Secondary | ICD-10-CM | POA: Insufficient documentation

## 2019-07-21 DIAGNOSIS — F329 Major depressive disorder, single episode, unspecified: Secondary | ICD-10-CM | POA: Insufficient documentation

## 2019-07-21 DIAGNOSIS — R519 Headache, unspecified: Secondary | ICD-10-CM | POA: Insufficient documentation

## 2019-07-21 DIAGNOSIS — Z9189 Other specified personal risk factors, not elsewhere classified: Secondary | ICD-10-CM | POA: Insufficient documentation

## 2019-07-21 DIAGNOSIS — IMO0002 Reserved for concepts with insufficient information to code with codable children: Secondary | ICD-10-CM | POA: Insufficient documentation

## 2019-07-21 DIAGNOSIS — Z87820 Personal history of traumatic brain injury: Secondary | ICD-10-CM | POA: Insufficient documentation

## 2019-07-21 DIAGNOSIS — M76899 Other specified enthesopathies of unspecified lower limb, excluding foot: Secondary | ICD-10-CM | POA: Insufficient documentation

## 2019-07-21 DIAGNOSIS — M545 Low back pain, unspecified: Secondary | ICD-10-CM | POA: Insufficient documentation

## 2020-03-15 ENCOUNTER — Encounter (HOSPITAL_COMMUNITY): Payer: Self-pay

## 2020-03-15 ENCOUNTER — Emergency Department (HOSPITAL_COMMUNITY): Payer: Federal, State, Local not specified - PPO

## 2020-03-15 ENCOUNTER — Emergency Department (HOSPITAL_COMMUNITY)
Admission: EM | Admit: 2020-03-15 | Discharge: 2020-03-15 | Disposition: A | Discharge status: Home / Self Care | Payer: Federal, State, Local not specified - PPO | Attending: Emergency Medicine | Admitting: Emergency Medicine

## 2020-03-15 ENCOUNTER — Other Ambulatory Visit: Payer: Self-pay

## 2020-03-15 DIAGNOSIS — W260XXA Contact with knife, initial encounter: Secondary | ICD-10-CM | POA: Diagnosis not present

## 2020-03-15 DIAGNOSIS — Y93G3 Activity, cooking and baking: Secondary | ICD-10-CM | POA: Insufficient documentation

## 2020-03-15 DIAGNOSIS — Z7951 Long term (current) use of inhaled steroids: Secondary | ICD-10-CM | POA: Diagnosis not present

## 2020-03-15 DIAGNOSIS — Z23 Encounter for immunization: Secondary | ICD-10-CM | POA: Diagnosis not present

## 2020-03-15 DIAGNOSIS — Z91040 Latex allergy status: Secondary | ICD-10-CM | POA: Diagnosis not present

## 2020-03-15 DIAGNOSIS — I10 Essential (primary) hypertension: Secondary | ICD-10-CM | POA: Diagnosis not present

## 2020-03-15 DIAGNOSIS — J45909 Unspecified asthma, uncomplicated: Secondary | ICD-10-CM | POA: Insufficient documentation

## 2020-03-15 DIAGNOSIS — Z79899 Other long term (current) drug therapy: Secondary | ICD-10-CM | POA: Diagnosis not present

## 2020-03-15 DIAGNOSIS — S61411A Laceration without foreign body of right hand, initial encounter: Secondary | ICD-10-CM

## 2020-03-15 MED ORDER — CEPHALEXIN 500 MG PO CAPS: 500.0000 mg | ORAL_CAPSULE | Freq: Two times a day (BID) | ORAL | 0 refills | Status: AC

## 2020-03-15 MED ORDER — TETANUS-DIPHTH-ACELL PERTUSSIS 5-2.5-18.5 LF-MCG/0.5 IM SUSY
0.5000 mL | PREFILLED_SYRINGE | Freq: Once | INTRAMUSCULAR | Status: AC
  Administered 2020-03-15: 0.5 mL via INTRAMUSCULAR
  Filled 2020-03-15: qty 0.5

## 2020-03-15 NOTE — ED Provider Notes (Signed)
[facility]-EMERGENCY DEPT Provider Note   CSN: [id number] Arrival date & time: 03/15/20  4128     History Chief Complaint  Patient presents with  . Laceration    Jordan Foster is a 39 y.o. female.  The history is provided by the patient.  Laceration Location:  Hand Hand laceration location:  R palm Length:  6 cm Depth:  Cutaneous Quality: straight   Bleeding: controlled   Laceration mechanism:  Knife Pain details:    Quality:  Aching   Severity:  Mild Tetanus status:  Unknown Associated symptoms: no focal weakness, no numbness, no rash, no redness, no swelling and no streaking        Past Medical History:  Diagnosis Date  . Asthma   . Depression   . Hypertension   . Positive TB test     Patient Active Problem List   Diagnosis Date Noted  . Numbness and tingling in left upper extremity 11/29/2016  . Hypertension 08/30/2016  . Major depression in partial remission (HCC) 08/30/2016  . Moderate persistent asthma without complication 08/30/2016    Past Surgical History:  Procedure Laterality Date  . LASIK    . NASAL SINUS SURGERY       OB History   No obstetric history on file.     Family History  Problem Relation Age of Onset  . Hypertension Mother     Social History   Tobacco Use  . Smoking status: Never Smoker  . Smokeless tobacco: Never Used  Vaping Use  . Vaping Use: Never used  Substance Use Topics  . Alcohol use: No  . Drug use: No    Home Medications Prior to Admission medications   Medication Sig Start Date End Date Taking? Authorizing Provider  albuterol (PROVENTIL HFA;VENTOLIN HFA) 108 (90 Base) MCG/ACT inhaler Inhale into the lungs every 6 (six) hours as needed for wheezing or shortness of breath.    [provider]  budesonide-formoterol (SYMBICORT) 160-4.5 MCG/ACT inhaler Inhale 2 puffs into the lungs 2 (two) times daily. Patient not taking: Reported on 07/08/2019 08/30/16   Veryl Speak, FNP  buPROPion (WELLBUTRIN XL) 150 MG 24 hr tablet TAKE 1 TABLET BY MOUTH EVERY DAY Patient not taking: Reported on 07/08/2019 12/18/16   Veryl Speak, FNP  buPROPion (WELLBUTRIN XL) 300 MG 24 hr tablet Take 300 mg by mouth daily.    [provider]  cephALEXin (KEFLEX) 500 MG capsule Take 1 capsule (500 mg total) by mouth 2 (two) times daily for 7 days. 03/15/20 03/22/20  Joshia Kitchings, DO  DULoxetine (CYMBALTA) 60 MG capsule Take 2 capsules (120 mg total) by mouth daily. Patient not taking: Reported on 07/08/2019 01/24/17   Veryl Speak, FNP  LORazepam (ATIVAN) 0.5 MG tablet Take 0.25-0.5 mg by mouth daily as needed for anxiety.  03/06/19   [provider]  losartan (COZAAR) 25 MG tablet Take 25 mg by mouth daily. 06/27/19   [provider]  metoprolol succinate (TOPROL-XL) 25 MG 24 hr tablet Take 1 tablet (25 mg total) by mouth daily. Patient not taking: Reported on 07/08/2019 01/24/17   Veryl Speak, FNP  prazosin (MINIPRESS) 2 MG capsule TAKE 1 CAPSULE (2 MG TOTAL) BY MOUTH AT BEDTIME. Patient not taking: Reported on 07/08/2019 12/18/16   Veryl Speak, FNP  traZODone (DESYREL) 50 MG tablet TAKE 1 TABLET (50 MG TOTAL) BY MOUTH AT BEDTIME AS NEEDED FOR SLEEP. Patient not taking: Reported on 07/08/2019 12/18/16  Veryl Speak, FNP    Allergies    Latex  Review of Systems   Review of Systems  Musculoskeletal: Negative for arthralgias and joint swelling.  Skin: Positive for wound. Negative for color change, pallor and rash.  Neurological: Negative for focal weakness, weakness and numbness.    Physical Exam Updated Vital Signs  ED Triage Vitals  Enc Vitals Group     BP 03/15/20 0745 137/90     Pulse Rate 03/15/20 0745 96     Resp 03/15/20 0745 16     Temp 03/15/20 0745 98.4 F (36.9 C)     Temp Source 03/15/20 0745 Oral     SpO2 03/15/20 0745 97 %     Weight 03/15/20 0749 148 lb (67.1 kg)     Height 03/15/20 0749 5\' 7"  (1.702 m)      Head Circumference --      Peak Flow --      Pain Score 03/15/20 0748 3     Pain Loc --      Pain Edu? --      Excl. in GC? --     Physical Exam Cardiovascular:     Pulses: Normal pulses.  Musculoskeletal:        General: No swelling or tenderness. Normal range of motion.     Comments: Normal flexion and extension of the right fingers  Skin:    Capillary Refill: Capillary refill takes less than 2 seconds.     Comments: Superficial 6 cm hemostatic wound to the palm of the right hand along crease line  Neurological:     Mental Status: She is alert.     Sensory: No sensory deficit.     Motor: No weakness.     Comments: Normal strength and sensation in the right hand     ED Results / Procedures / Treatments   Labs (all labs ordered are listed, but only abnormal results are displayed) Labs Reviewed - No data to display  EKG None  Radiology No results found.  Procedures .11/24/21Laceration Repair  Date/Time: 03/15/2020 8:19 AM Performed by: 03/17/2020, DO Authorized by: Virgina Norfolk, DO   Consent:    Consent obtained:  Verbal   Consent given by:  Patient   Risks discussed:  Infection, need for additional repair, nerve damage, pain, poor cosmetic result, poor wound healing, retained foreign body, tendon damage and vascular damage   Alternatives discussed:  No treatment Laceration details:    Length (cm):  6   Depth (mm):  1 Repair type:    Repair type:  Simple Pre-procedure details:    Preparation:  Patient was prepped and draped in usual sterile fashion Exploration:    Wound extent: no areolar tissue violation noted, no fascia violation noted, no foreign bodies/material noted, no muscle damage noted, no nerve damage noted, no tendon damage noted, no underlying fracture noted and no vascular damage noted     Contaminated: no   Treatment:    Area cleansed with:  Saline   Amount of cleaning:  Extensive   Irrigation solution:  Sterile saline   Irrigation volume:   1000 Skin repair:    Repair method:  Tissue adhesive and Steri-Strips   Number of Steri-Strips:  10 Approximation:    Approximation:  Close Post-procedure details:    Dressing:  Open (no dressing)   Patient tolerance of procedure:  Tolerated well, no immediate complications   (including critical care time)  Medications Ordered in ED Medications  Tdap (BOOSTRIX)  injection 0.5 mL (has no administration in time range)    ED Course  I have reviewed the triage vital signs and the nursing notes.  Pertinent labs & imaging results that were available during my care of the patient were reviewed by me and considered in my medical decision making (see chart for details).    MDM Rules/Calculators/A&P                          Jordan Foster is a 39 year old female with no significant medical history presents the ED with laceration.  Normal vitals.  No fever.  Superficial laceration to the palm of the right hand.  Neurovascularly neuromuscularly intact.  Normal range of motion of the left fingers.  Does not appear to have any tendon or ligament injury.  Wound is hemostatic and overall superficial.  Occurred after she quickly grabbed a knife that was falling.  Cyr decision was made and patient prefer Dermabond which I thought was reasonable.  Wound is fairly superficial.  Wound was washed out and Dermabond did and Steri-Strips also using patient placed in Kerlix wrap.  Given wound care instructions.  Will prescribe Keflex.  Understands return precautions.  Discharged in ED in good condition.  Tetanus shot was updated.  This chart was dictated using voice recognition software.  Despite best efforts to proofread,  errors can occur which can change the documentation meaning.    Final Clinical Impression(s) / ED Diagnoses Final diagnoses:  Laceration of right hand, foreign body presence unspecified, initial encounter    Rx / DC Orders ED Discharge Orders         Ordered    cephALEXin  (KEFLEX) 500 MG capsule  2 times daily        03/15/20 0824           Virgina Norfolk, DO 03/15/20 [phone]

## 2020-03-15 NOTE — ED Triage Notes (Signed)
Patient states she was cooking and dropped a knife with her right hand then grabbed it, cuting her right palm. Patient states she also hit he back of the right hand on a cabinet pull handle as well.

## 2020-03-17 ENCOUNTER — Encounter (HOSPITAL_COMMUNITY): Payer: Self-pay | Admitting: Emergency Medicine

## 2020-03-17 DIAGNOSIS — J45909 Unspecified asthma, uncomplicated: Secondary | ICD-10-CM | POA: Insufficient documentation

## 2020-03-17 DIAGNOSIS — Z91040 Latex allergy status: Secondary | ICD-10-CM | POA: Diagnosis not present

## 2020-03-17 DIAGNOSIS — Z79899 Other long term (current) drug therapy: Secondary | ICD-10-CM | POA: Insufficient documentation

## 2020-03-17 DIAGNOSIS — Z7951 Long term (current) use of inhaled steroids: Secondary | ICD-10-CM | POA: Insufficient documentation

## 2020-03-17 DIAGNOSIS — L03113 Cellulitis of right upper limb: Secondary | ICD-10-CM | POA: Insufficient documentation

## 2020-03-17 DIAGNOSIS — Z4800 Encounter for change or removal of nonsurgical wound dressing: Secondary | ICD-10-CM | POA: Diagnosis not present

## 2020-03-17 DIAGNOSIS — I10 Essential (primary) hypertension: Secondary | ICD-10-CM | POA: Insufficient documentation

## 2020-03-17 NOTE — ED Triage Notes (Signed)
Patient here from home reporting right hand laceration on 11/22 with dermabond. States that laceration is now bleeding with pain.

## 2020-03-18 ENCOUNTER — Emergency Department (HOSPITAL_COMMUNITY)
Admission: EM | Admit: 2020-03-18 | Discharge: 2020-03-18 | Disposition: A | Discharge status: Home / Self Care | Payer: Federal, State, Local not specified - PPO | Attending: Emergency Medicine | Admitting: Emergency Medicine

## 2020-03-18 ENCOUNTER — Other Ambulatory Visit: Payer: Self-pay

## 2020-03-18 DIAGNOSIS — Z5189 Encounter for other specified aftercare: Secondary | ICD-10-CM

## 2020-03-18 DIAGNOSIS — L03119 Cellulitis of unspecified part of limb: Secondary | ICD-10-CM

## 2020-03-18 MED ORDER — SULFAMETHOXAZOLE-TRIMETHOPRIM 800-160 MG PO TABS: 1.0000 | ORAL_TABLET | Freq: Two times a day (BID) | ORAL | 0 refills | Status: AC

## 2020-03-18 MED ORDER — BACITRACIN ZINC 500 UNIT/GM EX OINT: TOPICAL_OINTMENT | Freq: Once | CUTANEOUS | Status: AC

## 2020-03-18 MED ORDER — SULFAMETHOXAZOLE-TRIMETHOPRIM 800-160 MG PO TABS: 1.0000 | ORAL_TABLET | Freq: Two times a day (BID) | ORAL | 0 refills | Status: DC

## 2020-03-18 MED ORDER — BACITRACIN ZINC 500 UNIT/GM EX OINT
TOPICAL_OINTMENT | CUTANEOUS | Status: AC
  Administered 2020-03-18: 1
  Filled 2020-03-18: qty 1.8

## 2020-03-18 MED ORDER — SULFAMETHOXAZOLE-TRIMETHOPRIM 800-160 MG PO TABS
1.0000 | ORAL_TABLET | Freq: Once | ORAL | Status: AC
  Administered 2020-03-18: 1 via ORAL
  Filled 2020-03-18: qty 1

## 2020-03-18 NOTE — Discharge Instructions (Signed)
Avoid soaking your wound in stagnant or dirty water such as while taking a bath. You can shower normally. Keep the area clean with mild soap and warm water. We advise your wound remain covered with a gauze dressing to prevent contamination; change the dressing/bandage at least once per day. Do not apply peroxide or alcohol to your wound as this can break down newly forming skin and prolong wound healing. Finish your course of Keflex and take Bactrim as prescribed. Use 600mg  ibuprofen every 6 hours for pain. If symptoms worsen, return to the ED for wound recheck; you may also follow up with a hand specialist (Dr. ) for persistent symptoms.
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
## 2020-12-26 ENCOUNTER — Other Ambulatory Visit: Payer: Self-pay

## 2020-12-26 ENCOUNTER — Emergency Department (HOSPITAL_BASED_OUTPATIENT_CLINIC_OR_DEPARTMENT_OTHER): Payer: Federal, State, Local not specified - PPO

## 2020-12-26 ENCOUNTER — Encounter (HOSPITAL_BASED_OUTPATIENT_CLINIC_OR_DEPARTMENT_OTHER): Payer: Self-pay | Admitting: Obstetrics and Gynecology

## 2020-12-26 ENCOUNTER — Emergency Department (HOSPITAL_BASED_OUTPATIENT_CLINIC_OR_DEPARTMENT_OTHER)
Admission: EM | Admit: 2020-12-26 | Discharge: 2020-12-26 | Disposition: A | Discharge status: Home / Self Care | Payer: Federal, State, Local not specified - PPO | Attending: Emergency Medicine | Admitting: Emergency Medicine

## 2020-12-26 DIAGNOSIS — Z79899 Other long term (current) drug therapy: Secondary | ICD-10-CM | POA: Insufficient documentation

## 2020-12-26 DIAGNOSIS — Z91040 Latex allergy status: Secondary | ICD-10-CM | POA: Diagnosis not present

## 2020-12-26 DIAGNOSIS — R1031 Right lower quadrant pain: Secondary | ICD-10-CM | POA: Diagnosis not present

## 2020-12-26 DIAGNOSIS — I10 Essential (primary) hypertension: Secondary | ICD-10-CM | POA: Insufficient documentation

## 2020-12-26 DIAGNOSIS — J45909 Unspecified asthma, uncomplicated: Secondary | ICD-10-CM | POA: Insufficient documentation

## 2020-12-26 DIAGNOSIS — M549 Dorsalgia, unspecified: Secondary | ICD-10-CM | POA: Insufficient documentation

## 2020-12-26 LAB — URINALYSIS, ROUTINE W REFLEX MICROSCOPIC
Bilirubin Urine: NEGATIVE
Glucose, UA: NEGATIVE mg/dL
Hgb urine dipstick: NEGATIVE
Ketones, ur: NEGATIVE mg/dL
Leukocytes,Ua: NEGATIVE
Nitrite: NEGATIVE
Protein, ur: NEGATIVE mg/dL
Specific Gravity, Urine: 1.023 (ref 1.005–1.030)
pH: 7 (ref 5.0–8.0)

## 2020-12-26 LAB — COMPREHENSIVE METABOLIC PANEL
ALT: 20 U/L (ref 0–44)
AST: 32 U/L (ref 15–41)
Albumin: 4 g/dL (ref 3.5–5.0)
Alkaline Phosphatase: 63 U/L (ref 38–126)
Anion gap: 8 (ref 5–15)
BUN: 18 mg/dL (ref 6–20)
CO2: 30 mmol/L (ref 22–32)
Calcium: 8.9 mg/dL (ref 8.9–10.3)
Chloride: 100 mmol/L (ref 98–111)
Creatinine, Ser: 0.87 mg/dL (ref 0.44–1.00)
GFR, Estimated: >60 mL/min (ref >60)
Glucose, Bld: 98 mg/dL (ref 70–99)
Potassium: 4.2 mmol/L (ref 3.5–5.1)
Sodium: 138 mmol/L (ref 135–145)
Total Bilirubin: 0.7 mg/dL (ref 0.3–1.2)
Total Protein: 7.2 g/dL (ref 6.5–8.1)

## 2020-12-26 LAB — CBC
HCT: 38.5 % (ref 36.0–46.0)
Hemoglobin: 12.6 g/dL (ref 12.0–15.0)
MCH: 30.7 pg (ref 26.0–34.0)
MCHC: 32.7 g/dL (ref 30.0–36.0)
MCV: 93.7 fL (ref 80.0–100.0)
Platelets: 253 10*3/uL (ref 150–400)
RBC: 4.11 MIL/uL (ref 3.87–5.11)
RDW: 13.6 % (ref 11.5–15.5)
WBC: 7.2 10*3/uL (ref 4.0–10.5)
nRBC: 0 % (ref 0.0–0.2)

## 2020-12-26 LAB — LIPASE, BLOOD: Lipase: 30 U/L (ref 11–51)

## 2020-12-26 LAB — PREGNANCY, URINE: Preg Test, Ur: NEGATIVE

## 2020-12-26 MED ORDER — CYCLOBENZAPRINE HCL 10 MG PO TABS: 10.0000 mg | ORAL_TABLET | Freq: Two times a day (BID) | ORAL | 0 refills | Status: DC | PRN

## 2020-12-26 MED ORDER — LIDOCAINE 5 % EX PTCH
2.0000 | MEDICATED_PATCH | CUTANEOUS | Status: DC
Start: 2020-12-26 — End: 2020-12-27
  Administered 2020-12-26: 2 via TRANSDERMAL
  Filled 2020-12-26: qty 2

## 2020-12-26 MED ORDER — ACETAMINOPHEN 500 MG PO TABS
1000.0000 mg | ORAL_TABLET | Freq: Once | ORAL | Status: AC
  Administered 2020-12-26: 1000 mg via ORAL
  Filled 2020-12-26: qty 2

## 2020-12-26 MED ORDER — IOHEXOL 350 MG/ML SOLN
75.0000 mL | Freq: Once | INTRAVENOUS | Status: AC | PRN
  Administered 2020-12-26: 75 mL via INTRAVENOUS

## 2020-12-26 NOTE — ED Provider Notes (Signed)
[facility]   CSN: [id number] Arrival date & time: 12/26/20  1922     History Chief Complaint  Patient presents with   Abdominal Pain    [facility] is a 40 y.o. female.   Abdominal Pain Associated symptoms: no chest pain, no chills, no cough, no diarrhea, no dysuria, no fever, no hematuria, no nausea, no shortness of breath, no sore throat and no vomiting    40 year old female with a history of ulcerative colitis who presents to the emergency department with concern for bilateral flank pain/back pain that started 2 days ago.  She denies any specific heavy lifting or injury that she can think of.  She denies any radicular pain down her legs.  She denies any numbness or weakness, urinary or fecal incontinence.  She denies any recent IV drug abuse.  She denies any fevers or chills.  She has a history of ulcerative colitis and states that she frequently gets blood in her stools.  She has been taking her immunosuppressive medications.  She denies any specific nausea or vomiting, crampy abdominal pain.  She denies chest pain or shortness of breath.  She denies any vaginal bleeding or vaginal discharge.  Past Medical History:  Diagnosis Date   Asthma    Depression    Hypertension    Positive TB test     Patient Active Problem List   Diagnosis Date Noted   Numbness and tingling in left upper extremity 11/29/2016   Hypertension 08/30/2016   Major depression in partial remission (HCC) 08/30/2016   Moderate persistent asthma without complication 08/30/2016    Past Surgical History:  Procedure Laterality Date   LASIK     NASAL SINUS SURGERY       OB History     Gravida  0   Para  0   Term  0   Preterm  0   AB  0   Living  0      SAB  0   IAB  0   Ectopic  0   Multiple  0   Live Births  0           Family History  Problem Relation Age of Onset   Hypertension Mother     Social History   Tobacco Use    Smoking status: Never   Smokeless tobacco: Never  Vaping Use   Vaping Use: Never used  Substance Use Topics   Alcohol use: Yes    Alcohol/week: 35.0 standard drinks    Types: 35 Shots of liquor per week   Drug use: Yes    Frequency: 3.0 times per week    Types: Marijuana    Home Medications Prior to Admission medications   Medication Sig Start Date End Date Taking? Authorizing Provider  cyclobenzaprine (FLEXERIL) 10 MG tablet Take 1 tablet (10 mg total) by mouth 2 (two) times daily as needed for muscle spasms. 12/26/20  Yes Ernie AvenaLawsing, Thersia Petraglia, MD  albuterol (PROVENTIL HFA;VENTOLIN HFA) 108 (90 Base) MCG/ACT inhaler Inhale into the lungs every 6 (six) hours as needed for wheezing or shortness of breath.    [provider]  budesonide-formoterol (SYMBICORT) 160-4.5 MCG/ACT inhaler Inhale 2 puffs into the lungs 2 (two) times daily. Patient not taking: Reported on 07/08/2019 08/30/16   Veryl Speakalone, Gregory D, FNP  buPROPion (WELLBUTRIN XL) 150 MG 24 hr tablet TAKE 1 TABLET BY MOUTH EVERY DAY Patient not taking: Reported on 07/08/2019 12/18/16   Veryl Speakalone, Gregory D,  FNP  buPROPion (WELLBUTRIN XL) 300 MG 24 hr tablet Take 300 mg by mouth daily.    [provider]  DULoxetine (CYMBALTA) 60 MG capsule Take 2 capsules (120 mg total) by mouth daily. Patient not taking: Reported on 07/08/2019 01/24/17   Veryl Speak, FNP  LORazepam (ATIVAN) 0.5 MG tablet Take 0.25-0.5 mg by mouth daily as needed for anxiety.  03/06/19   [provider]  losartan (COZAAR) 25 MG tablet Take 25 mg by mouth daily. 06/27/19   [provider]  metoprolol succinate (TOPROL-XL) 25 MG 24 hr tablet Take 1 tablet (25 mg total) by mouth daily. Patient not taking: Reported on 07/08/2019 01/24/17   Veryl Speak, FNP  prazosin (MINIPRESS) 2 MG capsule TAKE 1 CAPSULE (2 MG TOTAL) BY MOUTH AT BEDTIME. Patient not taking: Reported on 07/08/2019 12/18/16   Veryl Speak, FNP  traZODone (DESYREL) 50 MG  tablet TAKE 1 TABLET (50 MG TOTAL) BY MOUTH AT BEDTIME AS NEEDED FOR SLEEP. Patient not taking: Reported on 07/08/2019 12/18/16   Veryl Speak, FNP    Allergies    Latex  Review of Systems   Review of Systems  Constitutional:  Negative for chills and fever.  HENT:  Negative for ear pain and sore throat.   Eyes:  Negative for pain and visual disturbance.  Respiratory:  Negative for cough and shortness of breath.   Cardiovascular:  Negative for chest pain and palpitations.  Gastrointestinal:  Positive for abdominal pain and blood in stool. Negative for diarrhea, nausea, rectal pain and vomiting.  Genitourinary:  Positive for flank pain. Negative for dysuria and hematuria.  Musculoskeletal:  Negative for arthralgias and back pain.  Skin:  Negative for color change and rash.  Neurological:  Negative for seizures and syncope.  All other systems reviewed and are negative.  Physical Exam Updated Vital Signs BP (!) 153/93 (BP Location: Right Arm)   Pulse 85   Temp 98.4 F (36.9 C)   Resp 20   LMP 12/20/2020 (Exact Date)   SpO2 99%   Physical Exam Vitals and nursing note reviewed.  Constitutional:      General: She is not in acute distress.    Appearance: She is well-developed.  HENT:     Head: Normocephalic and atraumatic.  Eyes:     Conjunctiva/sclera: Conjunctivae normal.  Cardiovascular:     Rate and Rhythm: Normal rate and regular rhythm.     Heart sounds: No murmur heard. Pulmonary:     Effort: Pulmonary effort is normal. No respiratory distress.     Breath sounds: Normal breath sounds.  Abdominal:     Palpations: Abdomen is soft.     Tenderness: There is abdominal tenderness in the right lower quadrant. There is no right CVA tenderness or left CVA tenderness.  Musculoskeletal:     Cervical back: Neck supple.     Comments: Mild bilateral muscular tenderness of the back.  Negative straight leg raise test.  No midline tenderness of the cervical, thoracic or lumbar  spine.  Skin:    General: Skin is warm and dry.  Neurological:     General: No focal deficit present.     Mental Status: She is alert and oriented to person, place, and time.     GCS: GCS eye subscore is 4. GCS verbal subscore is 5. GCS motor subscore is 6.     Cranial Nerves: No cranial nerve deficit.     Sensory: Sensation is intact.  Motor: Motor function is intact. No weakness.     Coordination: Coordination is intact.    ED Results / Procedures / Treatments   Labs (all labs ordered are listed, but only abnormal results are displayed) Labs Reviewed  LIPASE, BLOOD  COMPREHENSIVE METABOLIC PANEL  CBC  URINALYSIS, ROUTINE W REFLEX MICROSCOPIC  PREGNANCY, URINE    EKG None  Radiology CT ABDOMEN PELVIS W CONTRAST  Result Date: 12/26/2020 CLINICAL DATA:  Flank pain, kidney stone suspected RLQ abdominal pain, appendicitis suspected (Age >= 14y) Hx of ulcerative colitis, bilateral flank pain, RLQ TTP on exam EXAM: CT ABDOMEN AND PELVIS WITH CONTRAST TECHNIQUE: Multidetector CT imaging of the abdomen and pelvis was performed using the standard protocol following bolus administration of intravenous contrast. CONTRAST:  74mL OMNIPAQUE IOHEXOL 350 MG/ML SOLN COMPARISON:  None. FINDINGS: Lower chest: The lung bases are clear. No focal airspace disease or pleural fluid. Hepatobiliary: Focal fatty infiltration adjacent to the falciform ligament. No suspicious liver lesion. Gallbladder physiologically distended, no calcified stone. No biliary dilatation. Pancreas: No ductal dilatation or inflammation. Spleen: Normal in size without focal abnormality. Adrenals/Urinary Tract: Normal adrenal glands. Homogeneous renal enhancement without hydronephrosis or renal calculi. Punctate densities in the upper left kidney likely represent small cysts but are too small to characterize. Urinary bladder is minimally distended and not well evaluated. Stomach/Bowel: Stomach is distended with ingested material.  There is no small bowel obstruction or inflammation. The terminal ileum is noninflamed. Normal appendix. Moderate volume of colonic stool. No colonic wall thickening, colonic inflammation or pericolonic edema. Vascular/Lymphatic: Normal caliber abdominal aorta. Patent portal vein. No portal venous or mesenteric gas. Scattered small mesenteric lymph nodes are not enlarged by size criteria and typically reactive. Reproductive: Uterus and bilateral adnexa are unremarkable. Other: No free air, free fluid, or intra-abdominal fluid collection. Musculoskeletal: There are no acute or suspicious osseous abnormalities. IMPRESSION: 1. No acute abnormality in the abdomen/pelvis. Normal appendix. 2. Moderate volume of colonic stool, can be seen with constipation. No evidence of colitis. Electronically Signed   By: Narda Rutherford M.D.   On: 12/26/2020 21:53    Procedures Procedures   Medications Ordered in ED Medications  acetaminophen (TYLENOL) tablet 1,000 mg (1,000 mg Oral Given 12/26/20 2111)  iohexol (OMNIPAQUE) 350 MG/ML injection 75 mL (75 mLs Intravenous Contrast Given 12/26/20 2124)    ED Course  I have reviewed the triage vital signs and the nursing notes.  Pertinent labs & imaging results that were available during my care of the patient were reviewed by me and considered in my medical decision making (see chart for details).    MDM Rules/Calculators/A&P                           40 year old female with a history of ulcerative colitis who presents to the emergency department with concern for bilateral flank pain/back pain that started 2 days ago.  She denies any specific heavy lifting or injury that she can think of.  She denies any radicular pain down her legs.  She denies any numbness or weakness, urinary or fecal incontinence.  She denies any recent IV drug abuse.  She denies any fevers or chills.  She has a history of ulcerative colitis and states that she frequently gets blood in her stools.   She has been taking her immunosuppressive medications.  She denies any specific nausea or vomiting, crampy abdominal pain.  She denies chest pain or shortness of breath.  She  denies any vaginal bleeding or vaginal discharge.  On arrival, the patient was afebrile, hemodynamically stable, mildly hypertensive, not tachycardic or tachypneic, saturating well on room air.  She has a normal neurologic exam.  No weakness.  She has a negative straight leg raise test.  No recent injury.  She presents with bilateral flank pain as above.  She has frequent intermittent bloody stools associated with her ulcerative colitis but does not believe she is having a flare.  She feels like her current symptoms today are different from her ulcerative colitis.  On exam, she had mild right lower quadrant tenderness to palpation.  Differential diagnosis includes appendicitis, nephrolithiasis, most likely musculoskeletal strain.  No pelvic discomfort or pelvic discharge to suggest PID.  No urinary symptoms.  Screening labs obtained which were generally unremarkable as per above.  CT abdomen pelvis also revealed no focal findings in the abdomen or pelvis.  The patient has been appropriately medically screened and/or stabilized in the ED. I have low suspicion for any other emergent medical condition which would require further screening, evaluation or treatment in the ED or require inpatient management.  Patient symptoms are most consistent with musculoskeletal strain.  She was administered lidocaine patch, Flexeril in the emergency department and advised Tylenol for pain control.  Prescription for Flexeril provided.  Encouraged follow-up with her PCP. Final Clinical Impression(s) / ED Diagnoses Final diagnoses:  Musculoskeletal back pain    Rx / DC Orders ED Discharge Orders          Ordered    cyclobenzaprine (FLEXERIL) 10 MG tablet  2 times daily PRN        12/26/20 2208             Ernie Avena, MD 12/27/20  1226

## 2020-12-26 NOTE — ED Triage Notes (Signed)
Patient reports to the ER for bilateral flank pain that radiated to the front. Patient reports blood in her stool. Patient states she has UC. Denies N/V. Denies chest pain or ShOB.

## 2021-02-01 IMAGING — CT CT ANGIO CHEST
2 of 7 series · 18 of 46 positions shown · IV contrast (OMNIPAQUE)
Comparison: Chest radiograph 07/08/2019

CLINICAL DATA: Left chest pain radiating to left arm for 3 days

EXAM:
CT ANGIOGRAPHY CHEST WITH CONTRAST
TECHNIQUE: Multidetector CT imaging of the chest was performed using the
standard protocol during bolus administration of intravenous
contrast. Multiplanar CT image reconstructions and MIPs were
obtained to evaluate the vascular anatomy.
CONTRAST:  100mL OMNIPAQUE IOHEXOL 350 MG/ML SOLN

[Series 6: thins · axial · 0.78mm/px · z∈[+1539,+1793]mm · 16 of 288 slices shown]
[im 17/288  lung]
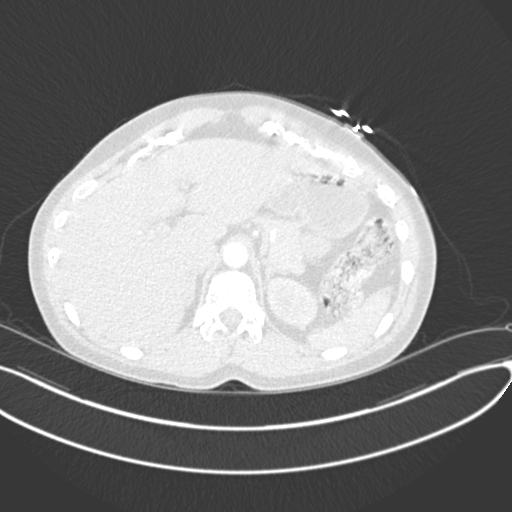
[im 34/288  soft-tissue]
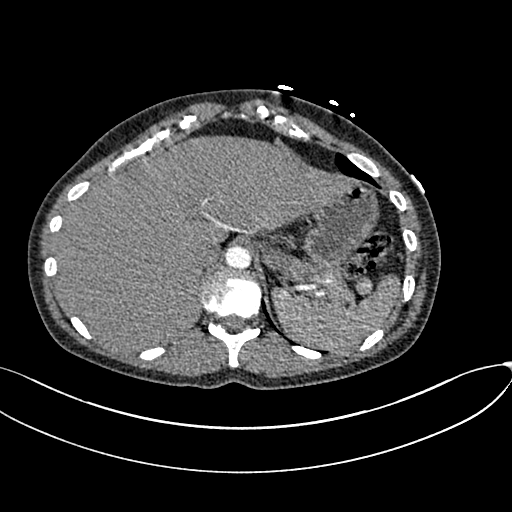
[im 51/288  lung]
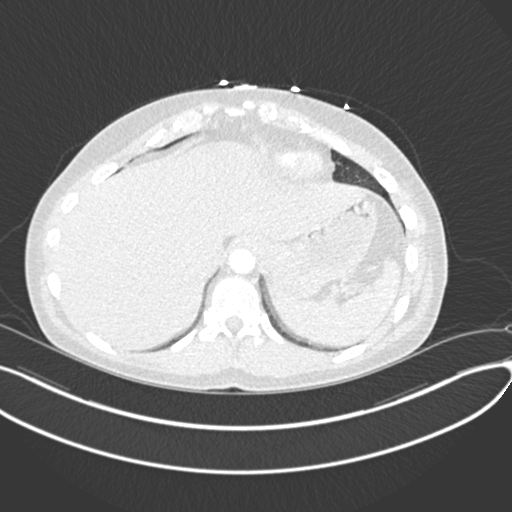
[im 68/288  soft-tissue]
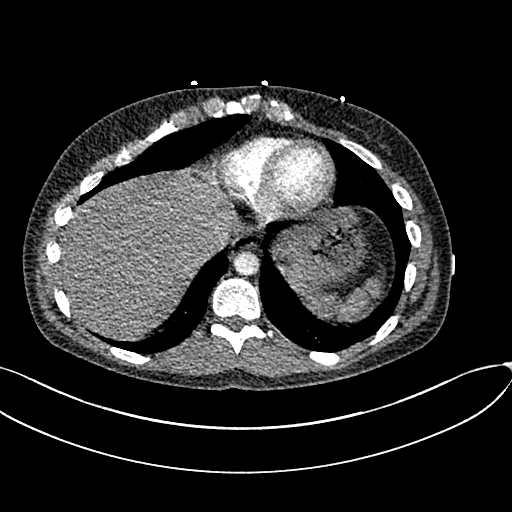
[im 85/288  lung]
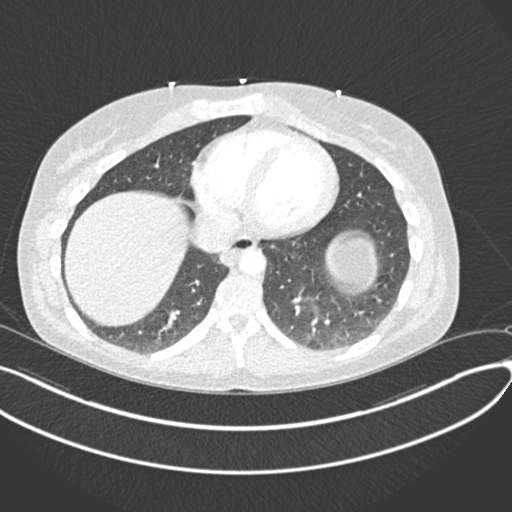
[im 102/288  soft-tissue]
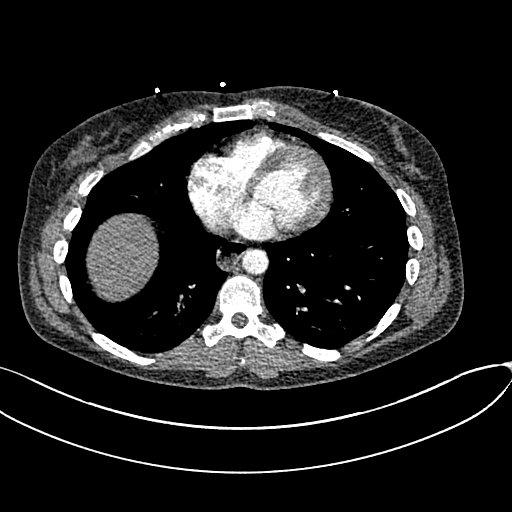
[im 119/288  lung]
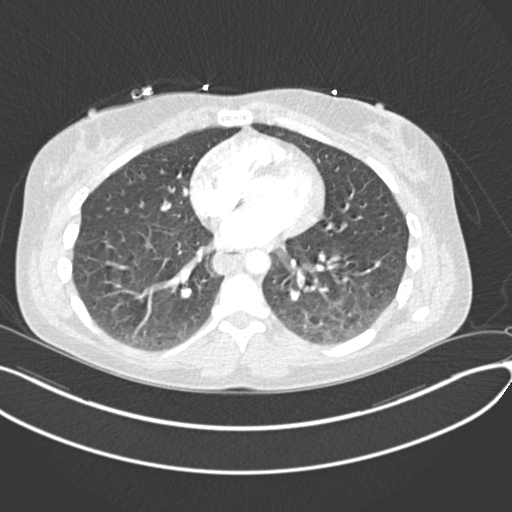
[im 136/288  soft-tissue]
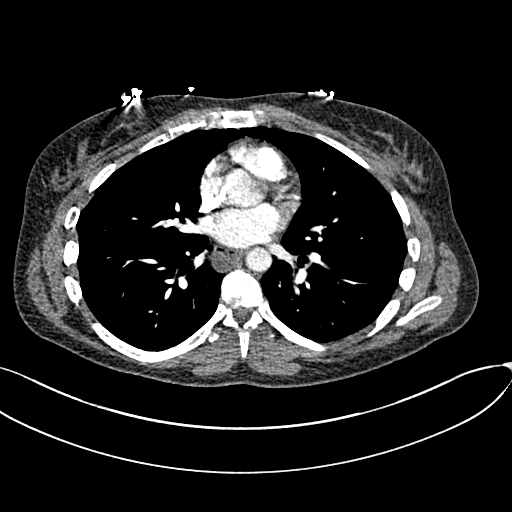
[im 152/288  lung]
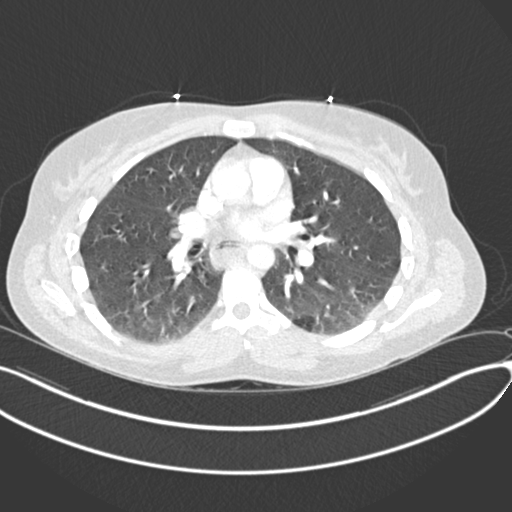
[im 169/288  soft-tissue]
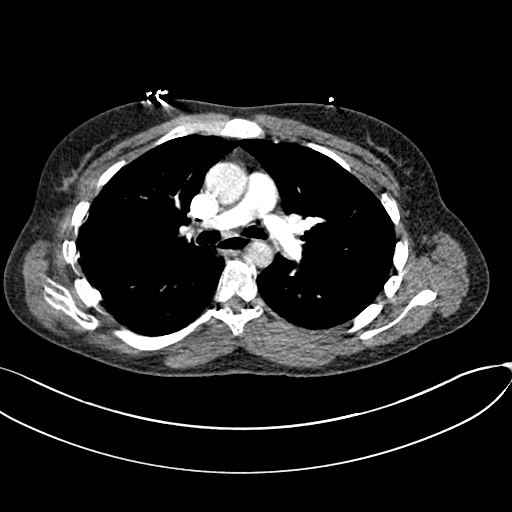
[im 186/288  lung]
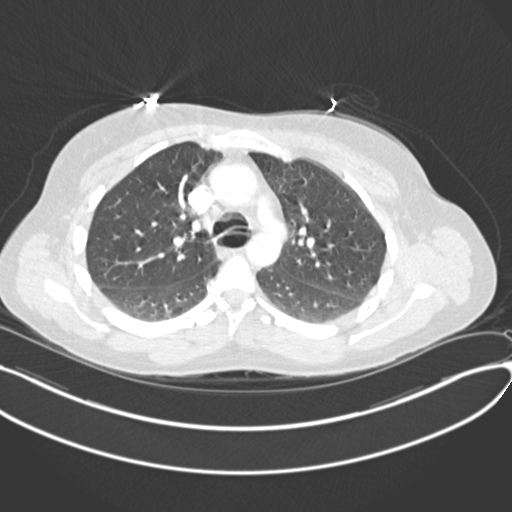
[im 203/288  soft-tissue]
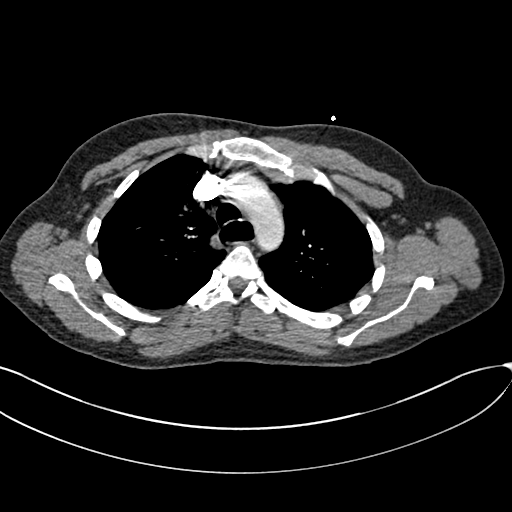
[im 220/288  lung]
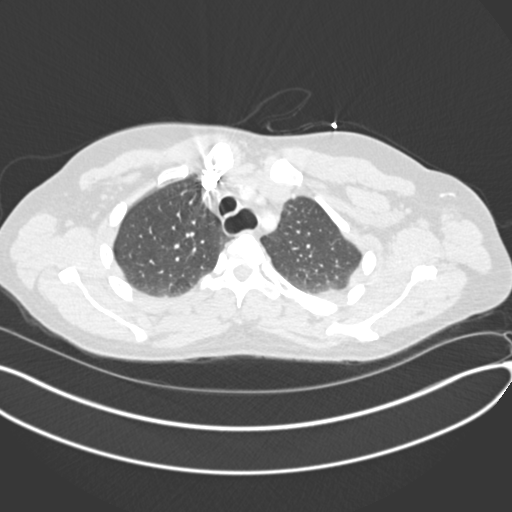
[im 237/288  soft-tissue]
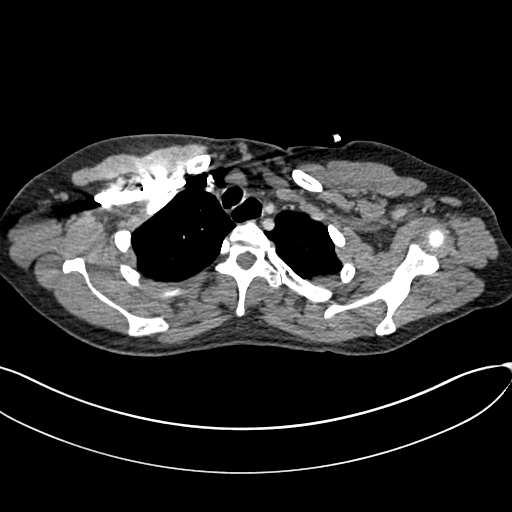
[im 254/288  lung]
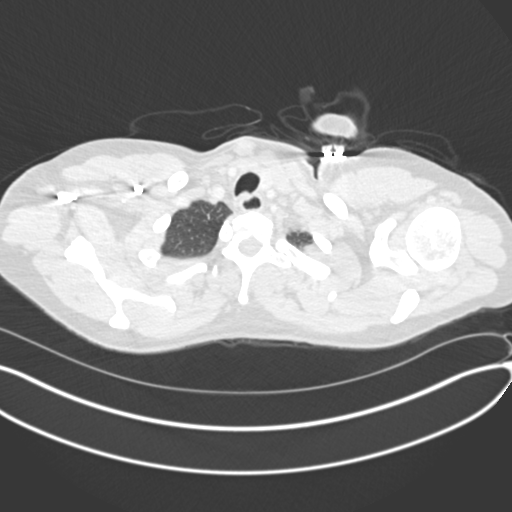
[im 271/288  soft-tissue]
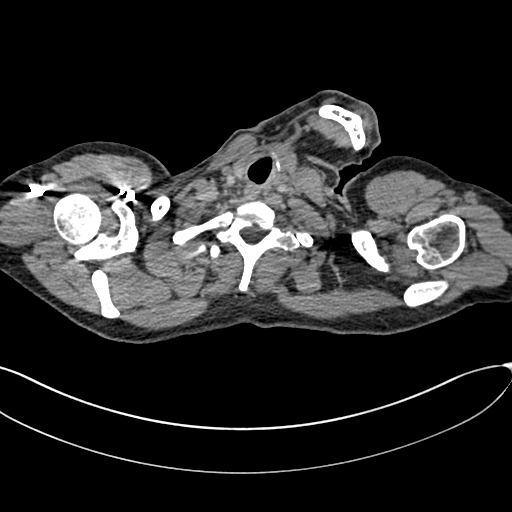

[Series 7: coronal mpr · coronal · 0.58mm/px · 2 of 90 slices shown]
[im 30/90  soft-tissue]
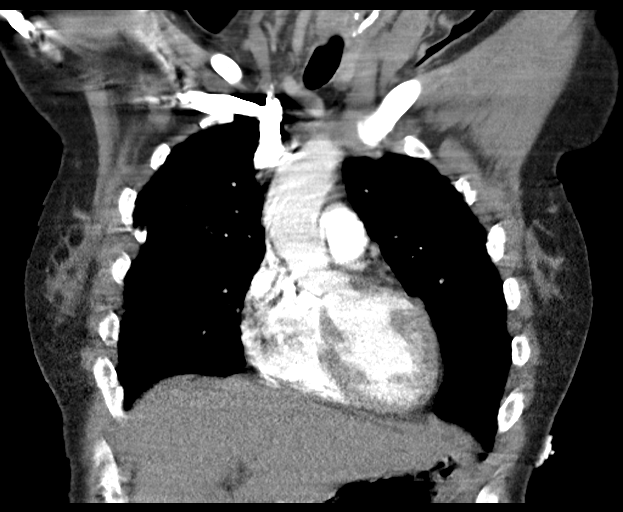
[im 60/90  soft-tissue]
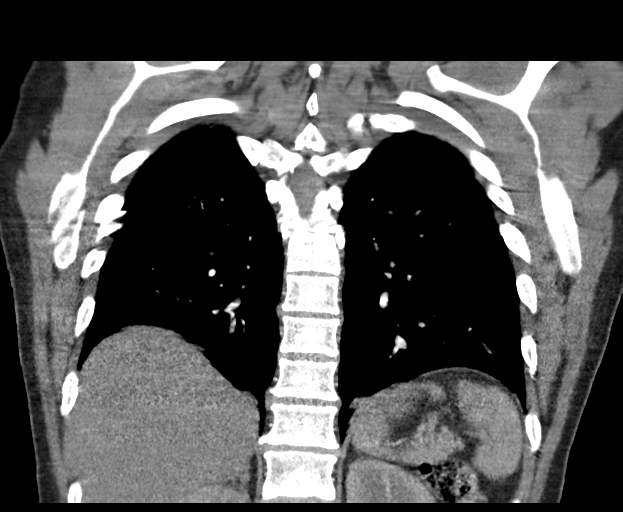

[18 of 46 positions shown; findings below may reference images not displayed]

FINDINGS: Cardiovascular: Satisfactory opacification of the pulmonary
arteries. Extensive respiratory motion artifact may limit evaluation
beyond the lobar level. No central or lobar pulmonary artery filling
defects are seen. The aortic root is suboptimally assessed given
cardiac pulsation artifact. The aorta is normal caliber. No
intramural hematoma, dissection flap or other acute luminal
abnormality of the aorta is seen. No periaortic stranding or
hemorrhage. Normal heart size. No pericardial effusion.

Mediastinum/Nodes: There is a patulous and fluid-filled thoracic
esophagus which could correlate for reflux. No mediastinal fluid or
air. No concerning mediastinal, hilar or axillary adenopathy. No
acute abnormality of the trachea. Thyroid gland and thoracic inlet
are unremarkable.

Lungs/Pleura: Limited evaluation of the lung parenchyma given
extensive respiratory motion artifact. No consolidation, features of
edema, pneumothorax, or effusion. Dependent atelectatic changes seen
posteriorly. No discernible suspicious pulmonary nodules or masses.

Upper Abdomen: No acute abnormalities present in the visualized
portions of the upper abdomen.

Musculoskeletal: No acute or suspicious osseous lesion within the
limitations of respiratory motion artifact and resulting step
artifact of the ribs. Chest wall is unremarkable. Included portions
of the shoulders are unremarkable.

Review of the MIP images confirms the above findings.
IMPRESSION: 1. No evidence of acute central or lobar pulmonary embolism. More
distal evaluation limited by respiratory motion artifact.
2. Patulous and fluid-filled thoracic esophagus, could correlate for
reflux.
3. No other acute thoracic abnormality to provide cause for
patient's radiating left chest and upper extremity pain.

## 2021-02-01 IMAGING — CR DG CHEST 2V
2 series · 2 of 2 positions shown · non-contrast
Comparison: None

CLINICAL DATA: Chest pain, left upper chest pain for 2 days, worse
at night, patient agitated

EXAM:
CHEST - 2 VIEW

[w chest pa]
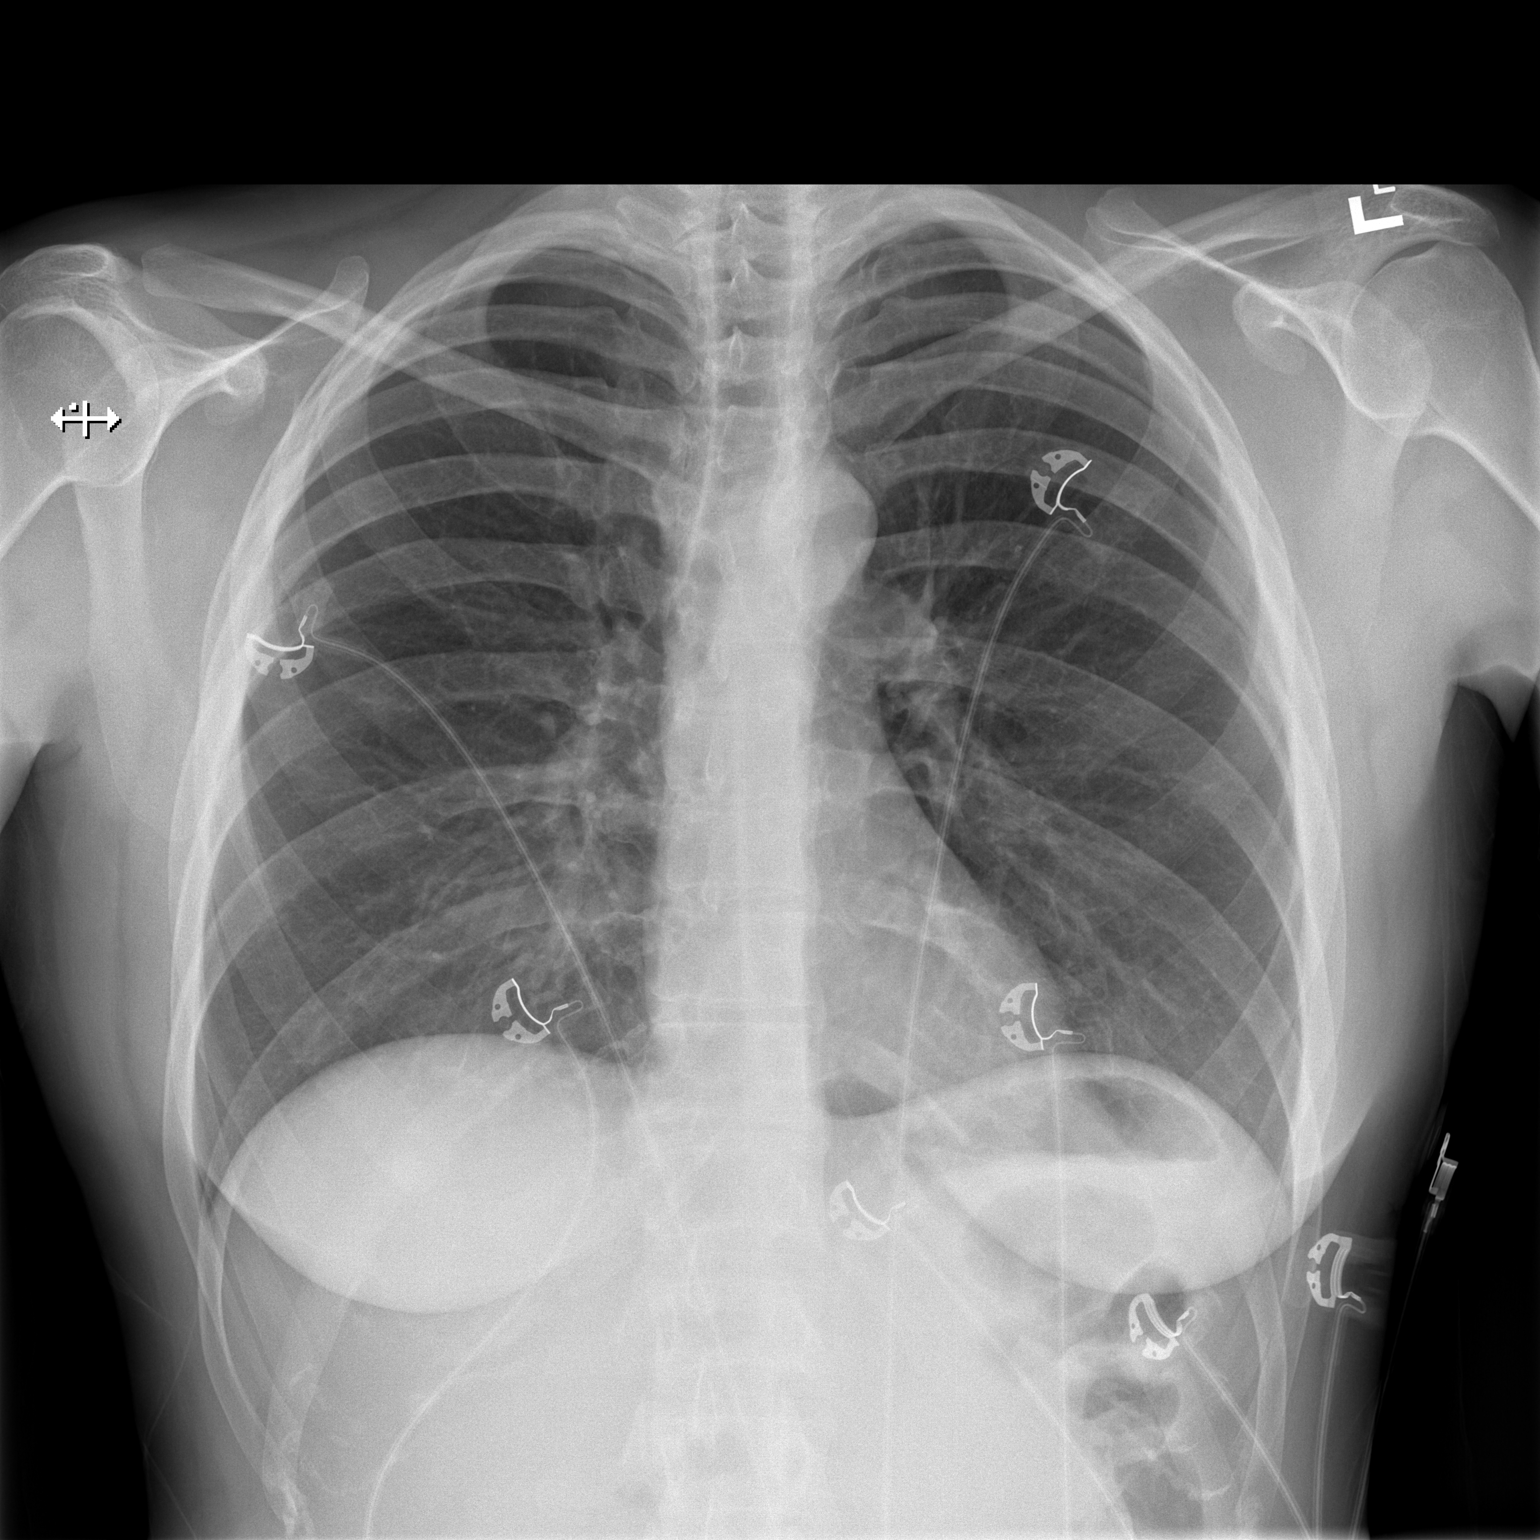

[w chest lat]
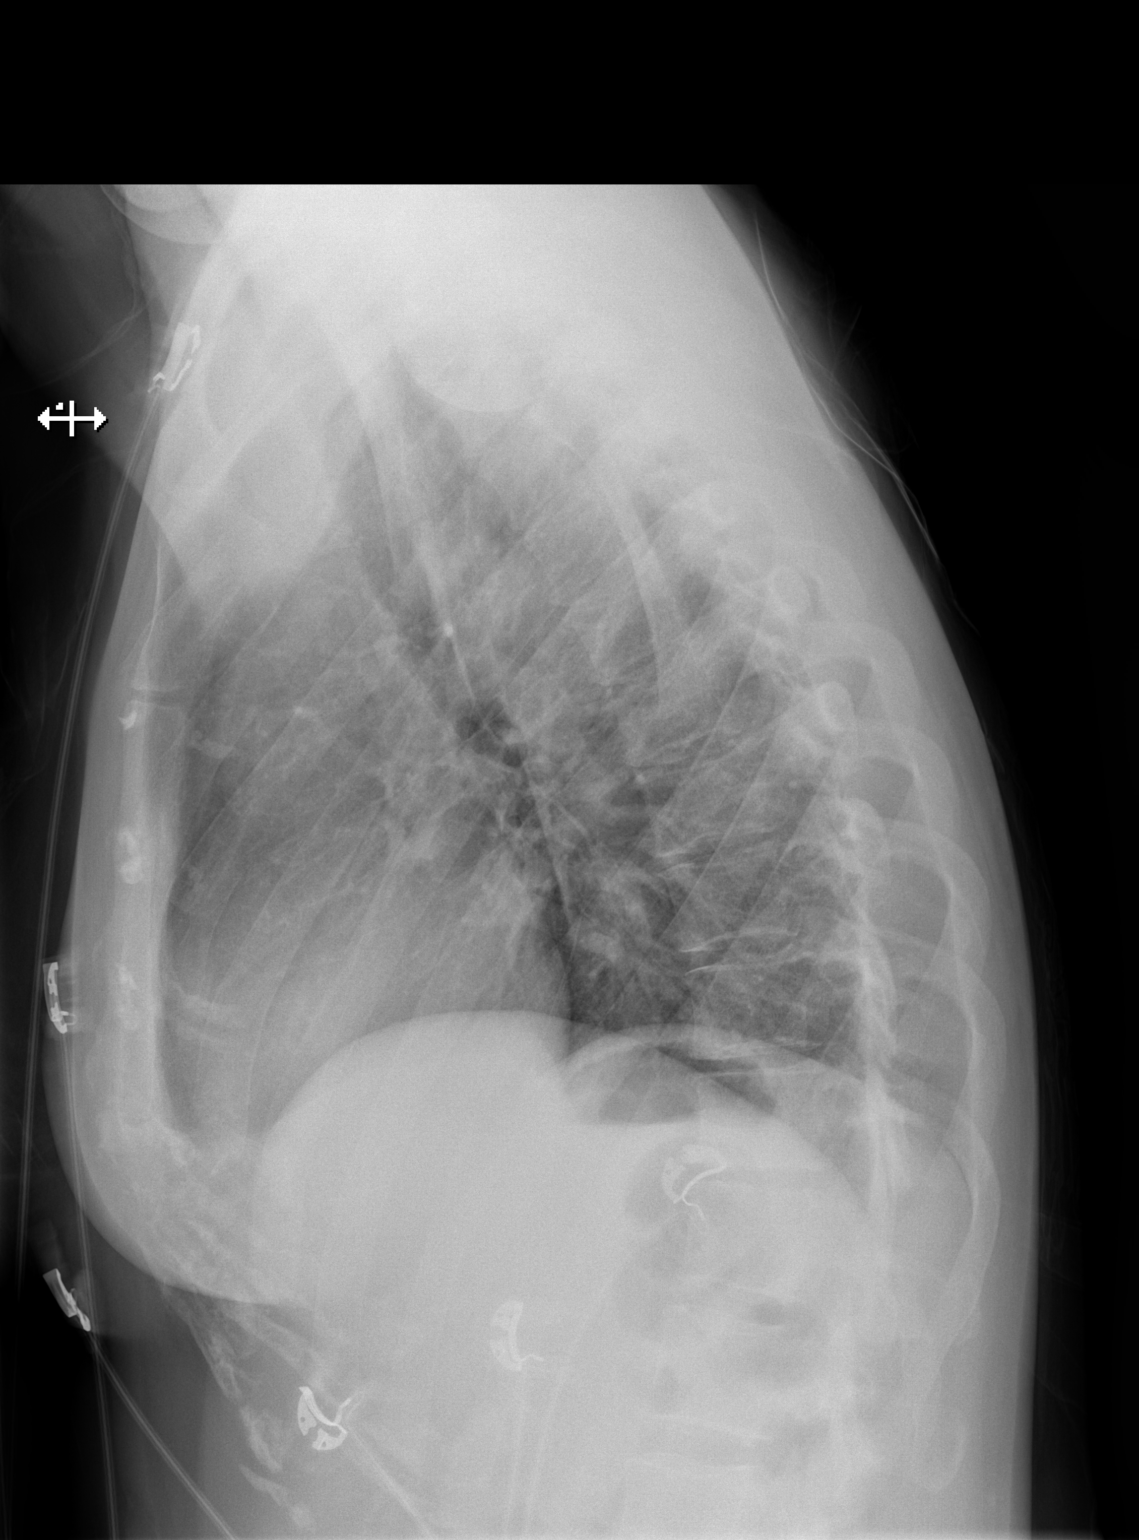

[2 of 2 positions shown; findings below may reference images not displayed]

FINDINGS: No consolidation, features of edema, pneumothorax, or effusion.
Pulmonary vascularity is normally distributed. The cardiomediastinal
contours are unremarkable. No acute osseous or soft tissue
abnormality.
IMPRESSION: No acute cardiopulmonary abnormality.

## 2021-09-03 ENCOUNTER — Other Ambulatory Visit (INDEPENDENT_AMBULATORY_CARE_PROVIDER_SITE_OTHER)
Admission: EM | Admit: 2021-09-03 | Discharge: 2021-09-06 | Disposition: A | Discharge status: Other Acute Inpatient Hospital | Payer: Federal, State, Local not specified - PPO | Source: Home / Self Care | Attending: Psychiatry | Admitting: Psychiatry

## 2021-09-03 ENCOUNTER — Other Ambulatory Visit: Payer: Self-pay

## 2021-09-03 DIAGNOSIS — R45851 Suicidal ideations: Secondary | ICD-10-CM | POA: Insufficient documentation

## 2021-09-03 DIAGNOSIS — F3341 Major depressive disorder, recurrent, in partial remission: Secondary | ICD-10-CM

## 2021-09-03 DIAGNOSIS — Z20822 Contact with and (suspected) exposure to covid-19: Secondary | ICD-10-CM | POA: Insufficient documentation

## 2021-09-03 DIAGNOSIS — F332 Major depressive disorder, recurrent severe without psychotic features: Secondary | ICD-10-CM

## 2021-09-03 DIAGNOSIS — K51911 Ulcerative colitis, unspecified with rectal bleeding: Secondary | ICD-10-CM | POA: Diagnosis present

## 2021-09-03 DIAGNOSIS — F1010 Alcohol abuse, uncomplicated: Secondary | ICD-10-CM | POA: Diagnosis present

## 2021-09-03 DIAGNOSIS — F1410 Cocaine abuse, uncomplicated: Secondary | ICD-10-CM | POA: Diagnosis present

## 2021-09-03 DIAGNOSIS — R12 Heartburn: Secondary | ICD-10-CM | POA: Diagnosis present

## 2021-09-03 DIAGNOSIS — G4700 Insomnia, unspecified: Secondary | ICD-10-CM | POA: Diagnosis present

## 2021-09-03 DIAGNOSIS — F4312 Post-traumatic stress disorder, chronic: Secondary | ICD-10-CM | POA: Diagnosis present

## 2021-09-03 DIAGNOSIS — Z79899 Other long term (current) drug therapy: Secondary | ICD-10-CM | POA: Diagnosis not present

## 2021-09-03 DIAGNOSIS — R000 Tachycardia, unspecified: Secondary | ICD-10-CM | POA: Diagnosis present

## 2021-09-03 DIAGNOSIS — F411 Generalized anxiety disorder: Secondary | ICD-10-CM | POA: Insufficient documentation

## 2021-09-03 DIAGNOSIS — F339 Major depressive disorder, recurrent, unspecified: Secondary | ICD-10-CM | POA: Diagnosis present

## 2021-09-03 DIAGNOSIS — F419 Anxiety disorder, unspecified: Secondary | ICD-10-CM | POA: Diagnosis not present

## 2021-09-03 DIAGNOSIS — I10 Essential (primary) hypertension: Secondary | ICD-10-CM | POA: Diagnosis present

## 2021-09-03 DIAGNOSIS — J45909 Unspecified asthma, uncomplicated: Secondary | ICD-10-CM | POA: Diagnosis present

## 2021-09-03 LAB — POCT URINE DRUG SCREEN - MANUAL ENTRY (I-SCREEN)
POC Amphetamine UR: NOT DETECTED
POC Buprenorphine (BUP): NOT DETECTED
POC Cocaine UR: POSITIVE — AB
POC Marijuana UR: NOT DETECTED
POC Methadone UR: NOT DETECTED
POC Methamphetamine UR: NOT DETECTED
POC Morphine: NOT DETECTED
POC Oxazepam (BZO): NOT DETECTED
POC Oxycodone UR: NOT DETECTED
POC Secobarbital (BAR): NOT DETECTED

## 2021-09-03 LAB — RESP PANEL BY RT-PCR (FLU A&B, COVID) ARPGX2
Influenza A by PCR: NEGATIVE
Influenza B by PCR: NEGATIVE
SARS Coronavirus 2 by RT PCR: NEGATIVE

## 2021-09-03 LAB — COMPREHENSIVE METABOLIC PANEL
ALT: 25 U/L (ref 0–44)
AST: 48 U/L — ABNORMAL HIGH (ref 15–41)
Albumin: 4 g/dL (ref 3.5–5.0)
Alkaline Phosphatase: 95 U/L (ref 38–126)
Anion gap: 12 (ref 5–15)
BUN: 12 mg/dL (ref 6–20)
CO2: 23 mmol/L (ref 22–32)
Calcium: 9 mg/dL (ref 8.9–10.3)
Chloride: 101 mmol/L (ref 98–111)
Creatinine, Ser: 1.37 mg/dL — ABNORMAL HIGH (ref 0.44–1.00)
GFR, Estimated: 50 mL/min — ABNORMAL LOW (ref >60)
Glucose, Bld: 93 mg/dL (ref 70–99)
Potassium: 4.3 mmol/L (ref 3.5–5.1)
Sodium: 136 mmol/L (ref 135–145)
Total Bilirubin: 1 mg/dL (ref 0.3–1.2)
Total Protein: 6.9 g/dL (ref 6.5–8.1)

## 2021-09-03 LAB — CBC WITH DIFFERENTIAL/PLATELET
Abs Immature Granulocytes: 0.01 10*3/uL (ref 0.00–0.07)
Basophils Absolute: 0.1 10*3/uL (ref 0.0–0.1)
Basophils Relative: 1 %
Eosinophils Absolute: 0.4 10*3/uL (ref 0.0–0.5)
Eosinophils Relative: 5 %
HCT: 40.4 % (ref 36.0–46.0)
Hemoglobin: 13.8 g/dL (ref 12.0–15.0)
Immature Granulocytes: 0 %
Lymphocytes Relative: 25 %
Lymphs Abs: 1.9 10*3/uL (ref 0.7–4.0)
MCH: 31.2 pg (ref 26.0–34.0)
MCHC: 34.2 g/dL (ref 30.0–36.0)
MCV: 91.2 fL (ref 80.0–100.0)
Monocytes Absolute: 0.7 10*3/uL (ref 0.1–1.0)
Monocytes Relative: 9 %
Neutro Abs: 4.7 10*3/uL (ref 1.7–7.7)
Neutrophils Relative %: 60 %
Platelets: 305 10*3/uL (ref 150–400)
RBC: 4.43 MIL/uL (ref 3.87–5.11)
RDW: 13.7 % (ref 11.5–15.5)
WBC: 7.8 10*3/uL (ref 4.0–10.5)
nRBC: 0 % (ref 0.0–0.2)

## 2021-09-03 LAB — ETHANOL: Alcohol, Ethyl (B): 73 mg/dL — ABNORMAL HIGH (ref <10)

## 2021-09-03 LAB — POC SARS CORONAVIRUS 2 AG: SARSCOV2ONAVIRUS 2 AG: NEGATIVE

## 2021-09-03 LAB — HEMOGLOBIN A1C
Hgb A1c MFr Bld: 5.2 % (ref 4.8–5.6)
Mean Plasma Glucose: 102.54 mg/dL

## 2021-09-03 LAB — LIPID PANEL
Cholesterol: 230 mg/dL — ABNORMAL HIGH (ref 0–200)
HDL: 130 mg/dL (ref >40)
LDL Cholesterol: 85 mg/dL (ref 0–99)
Total CHOL/HDL Ratio: 1.8 RATIO
Triglycerides: 77 mg/dL (ref <150)
VLDL: 15 mg/dL (ref 0–40)

## 2021-09-03 LAB — TSH: TSH: 2.494 u[IU]/mL (ref 0.350–4.500)

## 2021-09-03 MED ORDER — LOSARTAN POTASSIUM 50 MG PO TABS
25.0000 mg | ORAL_TABLET | Freq: Every day | ORAL | Status: DC
  Administered 2021-09-03 – 2021-09-05 (×3): 25 mg via ORAL
  Filled 2021-09-03 (×3): qty 1

## 2021-09-03 MED ORDER — TRAZODONE HCL 50 MG PO TABS: 50.0000 mg | ORAL_TABLET | Freq: Every evening | ORAL | Status: DC | PRN

## 2021-09-03 NOTE — Progress Notes (Signed)
Patient is alert and oriented X 4. Patient has abrasion on right leg, knee, various tattoos. Patient waiting for PCR results to be transferred to [facility] unit. ?

## 2021-09-03 NOTE — ED Provider Notes (Addendum)
Facility Based Crisis Admission H&P ? ?Date: 09/03/21 ?Patient Name: Jordan Foster ?MRN: [id number] ?Chief Complaint:  ?Chief Complaint  ?Patient presents with  ? Depression  ?  41 year old Jordan Foster came to [facility] with complaints of depressive symptoms and suicidal ideations. 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 Report in 2018 she attempted SI via overdose and was dead for 7 minutes. Patient denied homicidal ideations and denied auditory/visual. Patient report due to medication change she feels that triggered her suicidal ideations and the last time this happened she attempted suicide.   ?   ? ?Diagnoses:  ?Final diagnoses:  ?None  ? ? ?HPI: Jordan Foster is a 41 year old Caucasian female that presents due to worsening depression and anxiety.  She reports she had a recent medication adjustment and was initiated on Abilify and Celexa, she stated that she feels that her suicidal ideations have become more intense since the change with her medications. Stated  "This always happens after medication adjustment."  She reports she is followed by psychiatric provider with [facility]. ? ?Jordan Foster reports previous inpatient admission 2018 for attempted overdose, depression and suicidal ideations.  Denies that she currently has a plan to harm herself today however, feels that her mood and concentration "is not at baseline."  ? ?Labs reviewed UDS+ Cocaine.   Reports she is reached out to [facility] for additional outpatient support however was advised to follow-up with [facility] urgent care.  States she contacted crisis suicide line last night however states she was able to safety plan and speak to her father for additional support. ? ?Jordan Foster to be admitted to Facility Based Crisis pending lab results.  Support, encouragement and reassurance was provided. ? ?PHQ 2-9:  ?[employer] Visit from 09/22/2016 in [facility]  ?Thoughts that you  would be better off dead, or of hurting yourself in some way Not at all  ?PHQ-9 Total Score 4  ? ?  ?  ?Flowsheet Row ED from 12/26/2020 in [facility]  ?C-SSRS RISK CATEGORY No Risk  ? ?  ?  ? ?Total Time spent with patient: 15 minutes ? ?Musculoskeletal  ?Strength & Muscle Tone: within normal limits ?Gait & Station: normal ?Patient leans: N/A ? ?Psychiatric Specialty Exam  ?Presentation ?General Appearance: Appropriate for Environment ? ?Eye Contact:Good ? ?Speech:Clear and Coherent ? ?Speech Volume:Normal ? ?Handedness:Right ? ? ?Mood and Affect  ?Mood:Anxious ? ?Affect:Congruent ? ? ?Thought Process  ?Thought Processes:Coherent ? ?Descriptions of Associations:Intact ? ?Orientation:Full (Time, Place and Person) ? ?Thought Content:Logical ? Diagnosis of Schizophrenia or Schizoaffective disorder in past: No ?  ?Hallucinations:Hallucinations: None ? ?Ideas of Reference:None ? ?Suicidal Thoughts:Suicidal Thoughts: Yes, Passive ?SI Passive Intent and/or Plan: Without Intent ? ?Homicidal Thoughts:Homicidal Thoughts: No ? ? ?Sensorium  ?Memory:Immediate Good; Recent Good ? ?Judgment:Fair ? ?Insight:Good ? ? ?Executive Functions  ?Concentration:Good ? ?Attention Span:Good ? ?Recall:Good ? ?Fund of Knowledge:Good ? ?Language:Fair ? ? ?Psychomotor Activity  ?Psychomotor Activity:Psychomotor Activity: Normal ? ? ?Assets  ?Assets:Communication Skills; Social Support ? ? ?Sleep  ?Sleep:Sleep: Fair ? ? ?Nutritional Assessment (For OBS and FBC admissions only) ?Has the patient had a weight loss or gain of 10 pounds or more in the last 3 months?: No ?Has the patient had a decrease in food intake/or appetite?: No ?Does the patient have dental problems?: No ?Does the patient have eating habits or behaviors that may be indicators of an eating disorder including binging or inducing vomiting?: No ?Has  the patient recently lost weight without trying?: 0 ?Has the patient been eating poorly because of a  decreased appetite?: 0 ?Malnutrition Screening Tool Score: 0 ? ? ? ?Physical Exam ?Vitals and nursing note reviewed.  ?Cardiovascular:  ?   Pulses: Normal pulses.  ?Neurological:  ?   Mental Status: She is alert and oriented to person, place, and time.  ?Psychiatric:     ?   Mood and Affect: Mood normal.     ?   Behavior: Behavior normal.  ? ?Review of Systems  ?Psychiatric/Behavioral:  Positive for depression, substance abuse and suicidal ideas. The patient is nervous/anxious.   ? ?Blood pressure (!) 158/95, pulse 86, temperature 98.6 ?F (37 ?C), temperature source Oral, resp. rate 18, SpO2 99 %. There is no height or weight on file to calculate BMI. ? ?Past Psychiatric History: Major depressive disorder, suicidal ideations.  Generalized anxiety disorder, posttraumatic stress disorder.  Currently being prescribed Abilify and Celexa which she reports taking and tolerating well. ? ?Is the patient at risk to self? Yes  ?Has the patient been a risk to self in the past 6 months? Yes .    ?Has the patient been a risk to self within the distant past? No   ?Is the patient a risk to others? No   ?Has the patient been a risk to others in the past 6 months? No   ?Has the patient been a risk to others within the distant past? No  ? ?Past Medical History:  ?Past Medical History:  ?Diagnosis Date  ? Asthma   ? Depression   ? Hypertension   ? Positive TB test   ?  ?Past Surgical History:  ?Procedure Laterality Date  ? LASIK    ? NASAL SINUS SURGERY    ? ? ?Family History:  ?Family History  ?Problem Relation Age of Onset  ? Hypertension Mother   ? ? ?Social History:  ?Social History  ? ?Socioeconomic History  ? Marital status: Single  ?  Spouse name: Not on file  ? Number of children: 0  ? Years of education: 6916  ? Highest education level: Not on file  ?Occupational History  ? Occupation: [employer]   ?Tobacco Use  ? Smoking status: Never  ? Smokeless tobacco: Never  ?Vaping Use  ? Vaping Use: Never used  ?Substance and  Sexual Activity  ? Alcohol use: Yes  ?  Alcohol/week: 35.0 standard drinks  ?  Types: 35 Shots of liquor per week  ? Drug use: Yes  ?  Frequency: 3.0 times per week  ?  Types: Marijuana  ? Sexual activity: Yes  ?  Comment: W/ Female Partner  ?Other Topics Concern  ? Not on file  ?Social History Narrative  ? Fun/Hobby: Working to determine  ? ?Social Determinants of Health  ? ?Financial Resource Strain: Not on file  ?Food Insecurity: Not on file  ?Transportation Needs: Not on file  ?Physical Activity: Not on file  ?Stress: Not on file  ?Social Connections: Not on file  ?Intimate Partner Violence: Not on file  ? ? ?SDOH:  ?SDOH Screenings  ? ?Alcohol Screen: Not on file  ?Depression (PHQ2-9): Not on file  ?Financial Resource Strain: Not on file  ?Food Insecurity: Not on file  ?Housing: Not on file  ?Physical Activity: Not on file  ?Social Connections: Not on file  ?Stress: Not on file  ?Tobacco Use: Low Risk   ? Smoking Tobacco Use: Never  ? Smokeless Tobacco Use:  Never  ? Passive Exposure: Not on file  ?Transportation Needs: Not on file  ? ? ?Last Labs:  ?Admission on 09/03/2021  ?Component Date Value Ref Range Status  ? POC Amphetamine UR 09/03/2021 None Detected  NONE DETECTED (Cut Off Level 1000 ng/mL) Final  ? POC Secobarbital (BAR) 09/03/2021 None Detected  NONE DETECTED (Cut Off Level 300 ng/mL) Final  ? POC Buprenorphine (BUP) 09/03/2021 None Detected  NONE DETECTED (Cut Off Level 10 ng/mL) Final  ? POC Oxazepam (BZO) 09/03/2021 None Detected  NONE DETECTED (Cut Off Level 300 ng/mL) Final  ? POC Cocaine UR 09/03/2021 Positive (A)  NONE DETECTED (Cut Off Level 300 ng/mL) Final  ? POC Methamphetamine UR 09/03/2021 None Detected  NONE DETECTED (Cut Off Level 1000 ng/mL) Final  ? POC Morphine 09/03/2021 None Detected  NONE DETECTED (Cut Off Level 300 ng/mL) Final  ? POC Oxycodone UR 09/03/2021 None Detected  NONE DETECTED (Cut Off Level 100 ng/mL) Final  ? POC Methadone UR 09/03/2021 None Detected  NONE DETECTED  (Cut Off Level 300 ng/mL) Final  ? POC Marijuana UR 09/03/2021 None Detected  NONE DETECTED (Cut Off Level 50 ng/mL) Final  ? ? ?Allergies: Latex ? ?PTA Medications: (Not in a hospital admission) ? ? ?Long Term Go

## 2021-09-03 NOTE — ED Notes (Signed)
Pt is in the bed sleeping. Respirations are even and unlabored. No acute distress noted. Will continue to monitor for safety. 

## 2021-09-03 NOTE — ED Notes (Signed)
Patient admitted to [facility] due to depressed mood and thoughts of self harm.  Patient reports using etoh regularly and smoking marijuana daily.  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  Patient said she has been trying to find a therapist and has been having trouble finding one.  She has not had a therapist for one year.  She denies avh shi or plan at this time.  She was cooperative with admission.  Organized in thought, mood depressed affect constricted.  Patient encouraged to seek out staff if overwhelmed by thoughts or feelings.  She was oriented to unit, given toiletries, dinner and shown to her room.  Will monitor and provide safe environment.   ?

## 2021-09-03 NOTE — Progress Notes (Signed)
?   09/03/21 1251  ?[facility] Triage Screening (Walk-ins at [facility] only)  ?How Did You Hear About [place]? Self  ?What Is the Reason for Your Visit/Call Today? 41 year old Nautica Whittley came to [facility] with complaints of depressive symptoms and suicidal ideations. 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 Report in 2018 she attempted SI via overdose and was dead for 7 minutes. Patient denied homicidal ideations and denied auditory/visual. Patient report due to medication change she feels that triggered her suicidal ideations and the last time this happened she attempted suicide. Patient report he has been regression with past 3 weeks with suicidal ideations present 4 days ago.  ?How Long Has This Been Causing You Problems? <Week  ?Have You Recently Had Any Thoughts About Hurting Yourself? No  ?Are You Planning to Commit Suicide/Harm Yourself At This time? No  ?Have you Recently Had Thoughts About Hurting Someone Karolee Ohs? No  ?Are You Planning To Harm Someone At This Time? No  ?Are you currently experiencing any auditory, visual or other hallucinations? No  ?Have You Used Any Alcohol or Drugs in the Past 24 Hours? No  ?Do you have any current medical co-morbidities that require immediate attention? No  ?What Do You Feel Would Help You the Most Today? Medication(s)  ?If access to [facility] was not available, would you have sought care in the Emergency Department? No  ?Determination of Need Emergent (2 hours)  ?Options For Referral Medication Management  ? ? ?
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
## 2021-09-03 NOTE — Progress Notes (Signed)
Scheduled medication given of losartan. Provided patient an update of waiting for PCR to resolve. ?

## 2021-09-03 NOTE — ED Notes (Signed)
Patient participating well AA in groups. Patient is cooperative and interacts well with staff.  Respiratory is even and unlabored. No distress noted.  will continue to monitor for safety.  ?

## 2021-09-04 DIAGNOSIS — F332 Major depressive disorder, recurrent severe without psychotic features: Secondary | ICD-10-CM | POA: Diagnosis not present

## 2021-09-04 MED ORDER — PRAZOSIN HCL 2 MG PO CAPS
2.0000 mg | ORAL_CAPSULE | Freq: Every day | ORAL | Status: DC
  Administered 2021-09-04 – 2021-09-05 (×2): 2 mg via ORAL
  Filled 2021-09-04 (×2): qty 1

## 2021-09-04 MED ORDER — THIAMINE HCL 100 MG/ML IJ SOLN
100.0000 mg | Freq: Once | INTRAMUSCULAR | Status: AC
  Administered 2021-09-04: 100 mg via INTRAMUSCULAR
  Filled 2021-09-04: qty 2

## 2021-09-04 MED ORDER — ONDANSETRON 4 MG PO TBDP: 4.0000 mg | ORAL_TABLET | Freq: Four times a day (QID) | ORAL | Status: DC | PRN

## 2021-09-04 MED ORDER — LOPERAMIDE HCL 2 MG PO CAPS: 2.0000 mg | ORAL_CAPSULE | ORAL | Status: DC | PRN

## 2021-09-04 MED ORDER — ADULT MULTIVITAMIN W/MINERALS CH
1.0000 | ORAL_TABLET | Freq: Every day | ORAL | Status: DC
  Administered 2021-09-04 – 2021-09-06 (×3): 1 via ORAL
  Filled 2021-09-04 (×3): qty 1

## 2021-09-04 MED ORDER — HYDROXYZINE HCL 25 MG PO TABS: 25.0000 mg | ORAL_TABLET | Freq: Four times a day (QID) | ORAL | Status: DC | PRN

## 2021-09-04 MED ORDER — THIAMINE HCL 100 MG PO TABS
100.0000 mg | ORAL_TABLET | Freq: Every day | ORAL | Status: DC
  Administered 2021-09-05 – 2021-09-06 (×2): 100 mg via ORAL
  Filled 2021-09-04 (×2): qty 1

## 2021-09-04 MED ORDER — CHLORDIAZEPOXIDE HCL 25 MG PO CAPS: 25.0000 mg | ORAL_CAPSULE | Freq: Four times a day (QID) | ORAL | Status: DC | PRN

## 2021-09-04 MED ORDER — ARIPIPRAZOLE 5 MG PO TABS
5.0000 mg | ORAL_TABLET | Freq: Every day | ORAL | Status: DC
  Administered 2021-09-04 – 2021-09-06 (×3): 5 mg via ORAL
  Filled 2021-09-04 (×3): qty 1

## 2021-09-04 MED ORDER — TRAZODONE HCL 50 MG PO TABS
50.0000 mg | ORAL_TABLET | Freq: Every evening | ORAL | Status: DC | PRN
  Administered 2021-09-04 – 2021-09-05 (×2): 50 mg via ORAL
  Filled 2021-09-04 (×2): qty 1

## 2021-09-04 NOTE — ED Notes (Signed)
Pt was given 2 dinner boxs ?

## 2021-09-04 NOTE — ED Notes (Signed)
Patient is cooperative and interacts well with staff. Respiratory is even and unlabored. No distress noted. Patient is in the dayroom interacting with peers and watching TV at  present.  will continue to monitor for safety.  ?

## 2021-09-04 NOTE — ED Notes (Signed)
NP made aware of BP - patient states it is "always about that high before her BP med." Takes Cozaar at 1000 ?

## 2021-09-04 NOTE — ED Notes (Signed)
Pt is in the bed sleeping. Respirations are even and unlabored. No acute distress noted. Will continue to monitor for safety. 
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
## 2021-09-04 NOTE — ED Notes (Signed)
Pt requested for trazodone to help with sleep. 

## 2021-09-04 NOTE — ED Notes (Signed)
Awake and alert on unit sitting in dayroom with peers eating breakfast.  Patient is calm, pleasant, organized and logical.  Patient is able to make needs known to staff.  She denies avh shi or plan at present.  Appears sad and tired.  Patient is without withdrawal symptoms.  Will monitor and provide safe supportive environment.  ?

## 2021-09-05 LAB — POCT PREGNANCY, URINE: Preg Test, Ur: NEGATIVE

## 2021-09-05 MED ORDER — MESALAMINE 1.2 G PO TBEC
4.8000 g | DELAYED_RELEASE_TABLET | Freq: Every day | ORAL | Status: DC
  Administered 2021-09-05 – 2021-09-06 (×2): 4.8 g via ORAL
  Filled 2021-09-05 (×3): qty 4

## 2021-09-05 MED ORDER — PANTOPRAZOLE SODIUM 40 MG PO TBEC
40.0000 mg | DELAYED_RELEASE_TABLET | Freq: Every day | ORAL | Status: DC
  Administered 2021-09-05 – 2021-09-06 (×2): 40 mg via ORAL
  Filled 2021-09-05 (×2): qty 1

## 2021-09-05 MED ORDER — PROPRANOLOL HCL ER 60 MG PO CP24
60.0000 mg | ORAL_CAPSULE | Freq: Every day | ORAL | Status: DC
Start: 2021-09-05 — End: 2021-09-06
  Administered 2021-09-05 – 2021-09-06 (×2): 60 mg via ORAL
  Filled 2021-09-05 (×2): qty 1

## 2021-09-05 MED ORDER — MOMETASONE FURO-FORMOTEROL FUM 200-5 MCG/ACT IN AERO
2.0000 | INHALATION_SPRAY | Freq: Two times a day (BID) | RESPIRATORY_TRACT | Status: DC
  Administered 2021-09-05 – 2021-09-06 (×3): 2 via RESPIRATORY_TRACT
  Filled 2021-09-05: qty 8.8

## 2021-09-05 MED ORDER — ACETAMINOPHEN 325 MG PO TABS
650.0000 mg | ORAL_TABLET | Freq: Four times a day (QID) | ORAL | Status: DC | PRN
  Administered 2021-09-05: 650 mg via ORAL
  Filled 2021-09-05: qty 2

## 2021-09-05 MED ORDER — ALBUTEROL SULFATE HFA 108 (90 BASE) MCG/ACT IN AERS: 2.0000 | INHALATION_SPRAY | RESPIRATORY_TRACT | Status: DC | PRN

## 2021-09-05 MED ORDER — LOSARTAN POTASSIUM 50 MG PO TABS
50.0000 mg | ORAL_TABLET | Freq: Every day | ORAL | Status: DC
  Administered 2021-09-06: 50 mg via ORAL
  Filled 2021-09-05: qty 1

## 2021-09-05 MED ORDER — VENLAFAXINE HCL ER 37.5 MG PO CP24
37.5000 mg | ORAL_CAPSULE | Freq: Every day | ORAL | Status: DC
  Administered 2021-09-05 – 2021-09-06 (×2): 37.5 mg via ORAL
  Filled 2021-09-05 (×2): qty 1
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
## 2021-09-05 NOTE — ED Notes (Signed)
Pt eating lunch

## 2021-09-05 NOTE — ED Notes (Signed)
Pt sleeping in no acute distress. RR even and unlabored. Safety maintained. 

## 2021-09-05 NOTE — ED Notes (Signed)
Pt complained of a headache 5/10 on pain scale. Tylenol given for sx relief. Pt denies SI/HI/AVH but endorse feeling depressed about circumstances. Pt states, "I moved here to be close to family and they all moved away. I just get thinking about my situations and feel down on myself. I'll be alright though". Support and encouragement provided. Informed pt to notify staff with any impulses or urges to harm self. Pt verbalized agreement. Will continue to monitor for safety. ?

## 2021-09-05 NOTE — ED Notes (Signed)
Received patient this PM. Patient in dayroom. Patient respirations are even and unlabored. Will continue to monitor for safety.  ?

## 2021-09-05 NOTE — ED Notes (Addendum)
Pt. Attended Group. We talked about depression and coping skills. Said she tried to go through the [place] but they didn't have any open beds. [facility] is trying to get her into an inpatient place. She wants to transfer and continue to be on the right path so her depression doesn't worsen.  ?

## 2021-09-05 NOTE — Group Note (Signed)
Group Topic: Relapse and Recovery  ?Group Date: 09/05/2021 ?Start Time: 1240 ?End Time: 1308 ?Facilitators: Doyne Keel E  ?Department: [facility] ? ?Number of Participants: 3  ?Group Focus: coping skills and substance abuse education ?Treatment Modality:  Psychoeducation ?Interventions utilized were patient education, problem solving, and support ?Purpose: enhance coping skills and relapse prevention strategies ? ?Name: Jordan Foster Date of Birth: 1980/09/12  ?MR: [id number]   ? ?Level of Participation: minimal ?Quality of Participation: attentive ?Interactions with others: gave feedback ?Mood/Affect: appropriate ?Triggers (if applicable): N/A ?Cognition: coherent/clear ?Progress: Moderate ?Response: n/a ?Plan: follow-up needed ? ?Patients Problems:  ?Patient Active Problem List  ? Diagnosis Date Noted  ? Suicidal ideations 09/03/2021  ? Numbness and tingling in left upper extremity 11/29/2016  ? Hypertension 08/30/2016  ? Major depression in partial remission (HCC) 08/30/2016  ? Moderate persistent asthma without complication 08/30/2016  ?  ?

## 2021-09-05 NOTE — ED Notes (Signed)
Pt is in the bed sleeping. Respirations are even and unlabored. No acute distress noted. Will continue to monitor for safety. 

## 2021-09-05 NOTE — ED Notes (Signed)
Pt sitting in dayroom interacting with peers in no acute distress. Denies concerns or needs at present. Will continue to monitor for safety. ?

## 2021-09-06 ENCOUNTER — Encounter (HOSPITAL_COMMUNITY): Payer: Self-pay | Admitting: Psychiatry

## 2021-09-06 ENCOUNTER — Other Ambulatory Visit: Payer: Self-pay

## 2021-09-06 ENCOUNTER — Inpatient Hospital Stay (HOSPITAL_COMMUNITY)
Admission: AD | Admit: 2021-09-06 | Discharge: 2021-09-13 | DRG: 885 | Disposition: A | Discharge status: Home / Self Care | Payer: Federal, State, Local not specified - PPO | Source: Intra-hospital | Attending: Psychiatry | Admitting: Psychiatry

## 2021-09-06 DIAGNOSIS — F1410 Cocaine abuse, uncomplicated: Secondary | ICD-10-CM | POA: Diagnosis present

## 2021-09-06 DIAGNOSIS — G4700 Insomnia, unspecified: Secondary | ICD-10-CM | POA: Diagnosis present

## 2021-09-06 DIAGNOSIS — F1010 Alcohol abuse, uncomplicated: Secondary | ICD-10-CM | POA: Diagnosis present

## 2021-09-06 DIAGNOSIS — Z20822 Contact with and (suspected) exposure to covid-19: Secondary | ICD-10-CM | POA: Diagnosis present

## 2021-09-06 DIAGNOSIS — R12 Heartburn: Secondary | ICD-10-CM | POA: Diagnosis present

## 2021-09-06 DIAGNOSIS — F4310 Post-traumatic stress disorder, unspecified: Secondary | ICD-10-CM | POA: Diagnosis present

## 2021-09-06 DIAGNOSIS — F339 Major depressive disorder, recurrent, unspecified: Principal | ICD-10-CM | POA: Diagnosis present

## 2021-09-06 DIAGNOSIS — J45909 Unspecified asthma, uncomplicated: Secondary | ICD-10-CM | POA: Diagnosis present

## 2021-09-06 DIAGNOSIS — I10 Essential (primary) hypertension: Secondary | ICD-10-CM | POA: Diagnosis present

## 2021-09-06 DIAGNOSIS — F4312 Post-traumatic stress disorder, chronic: Secondary | ICD-10-CM | POA: Diagnosis present

## 2021-09-06 DIAGNOSIS — R000 Tachycardia, unspecified: Secondary | ICD-10-CM | POA: Diagnosis present

## 2021-09-06 DIAGNOSIS — Z79899 Other long term (current) drug therapy: Secondary | ICD-10-CM | POA: Diagnosis not present

## 2021-09-06 DIAGNOSIS — R45851 Suicidal ideations: Secondary | ICD-10-CM | POA: Diagnosis present

## 2021-09-06 DIAGNOSIS — F419 Anxiety disorder, unspecified: Secondary | ICD-10-CM | POA: Diagnosis not present

## 2021-09-06 DIAGNOSIS — K51911 Ulcerative colitis, unspecified with rectal bleeding: Secondary | ICD-10-CM | POA: Diagnosis present

## 2021-09-06 DIAGNOSIS — F332 Major depressive disorder, recurrent severe without psychotic features: Secondary | ICD-10-CM | POA: Diagnosis not present

## 2021-09-06 DIAGNOSIS — K5190 Ulcerative colitis, unspecified, without complications: Secondary | ICD-10-CM

## 2021-09-06 MED ORDER — LORATADINE 10 MG PO TABS
10.0000 mg | ORAL_TABLET | Freq: Every day | ORAL | Status: DC
  Administered 2021-09-06 – 2021-09-13 (×8): 10 mg via ORAL
  Filled 2021-09-06 (×11): qty 1

## 2021-09-06 MED ORDER — LORAZEPAM 1 MG PO TABS
1.0000 mg | ORAL_TABLET | Freq: Two times a day (BID) | ORAL | Status: AC
  Administered 2021-09-09 (×2): 1 mg via ORAL
  Filled 2021-09-06 (×2): qty 1

## 2021-09-06 MED ORDER — CLONIDINE HCL 0.1 MG PO TABS
0.1000 mg | ORAL_TABLET | Freq: Every day | ORAL | Status: AC
  Administered 2021-09-06: 0.1 mg via ORAL
  Filled 2021-09-06 (×2): qty 1

## 2021-09-06 MED ORDER — THIAMINE HCL 100 MG PO TABS
100.0000 mg | ORAL_TABLET | Freq: Every day | ORAL | Status: DC
  Administered 2021-09-07 – 2021-09-13 (×7): 100 mg via ORAL
  Filled 2021-09-06 (×10): qty 1

## 2021-09-06 MED ORDER — ALBUTEROL SULFATE HFA 108 (90 BASE) MCG/ACT IN AERS: 2.0000 | INHALATION_SPRAY | RESPIRATORY_TRACT | Status: DC | PRN

## 2021-09-06 MED ORDER — ALUM & MAG HYDROXIDE-SIMETH 200-200-20 MG/5ML PO SUSP: 30.0000 mL | ORAL | Status: DC | PRN

## 2021-09-06 MED ORDER — PROPRANOLOL HCL ER 60 MG PO CP24
60.0000 mg | ORAL_CAPSULE | Freq: Every day | ORAL | Status: DC
  Administered 2021-09-07 – 2021-09-13 (×7): 60 mg via ORAL
  Filled 2021-09-06 (×13): qty 1

## 2021-09-06 MED ORDER — MOMETASONE FURO-FORMOTEROL FUM 200-5 MCG/ACT IN AERO
2.0000 | INHALATION_SPRAY | Freq: Two times a day (BID) | RESPIRATORY_TRACT | Status: DC
Start: 2021-09-06 — End: 2021-09-13
  Administered 2021-09-06 – 2021-09-13 (×14): 2 via RESPIRATORY_TRACT
  Filled 2021-09-06: qty 8.8

## 2021-09-06 MED ORDER — LOSARTAN POTASSIUM 50 MG PO TABS
50.0000 mg | ORAL_TABLET | Freq: Every day | ORAL | Status: DC
Start: 2021-09-06 — End: 2021-09-13
  Administered 2021-09-07 – 2021-09-13 (×7): 50 mg via ORAL
  Filled 2021-09-06 (×10): qty 1

## 2021-09-06 MED ORDER — THIAMINE HCL 100 MG/ML IJ SOLN
100.0000 mg | Freq: Once | INTRAMUSCULAR | Status: AC
  Administered 2021-09-06: 100 mg via INTRAMUSCULAR
  Filled 2021-09-06: qty 2

## 2021-09-06 MED ORDER — LOPERAMIDE HCL 2 MG PO CAPS: 2.0000 mg | ORAL_CAPSULE | ORAL | Status: AC | PRN

## 2021-09-06 MED ORDER — VENLAFAXINE HCL ER 37.5 MG PO CP24: 37.5000 mg | ORAL_CAPSULE | Freq: Every day | ORAL | Status: DC

## 2021-09-06 MED ORDER — LORAZEPAM 1 MG PO TABS
1.0000 mg | ORAL_TABLET | Freq: Every day | ORAL | Status: AC
  Administered 2021-09-10: 1 mg via ORAL
  Filled 2021-09-06: qty 1

## 2021-09-06 MED ORDER — LORAZEPAM 1 MG PO TABS
1.0000 mg | ORAL_TABLET | Freq: Three times a day (TID) | ORAL | Status: AC
  Administered 2021-09-08 (×3): 1 mg via ORAL
  Filled 2021-09-06 (×3): qty 1

## 2021-09-06 MED ORDER — VENLAFAXINE HCL ER 37.5 MG PO CP24
37.5000 mg | ORAL_CAPSULE | Freq: Every day | ORAL | Status: DC
  Administered 2021-09-07: 37.5 mg via ORAL
  Filled 2021-09-06 (×3): qty 1

## 2021-09-06 MED ORDER — PANTOPRAZOLE SODIUM 40 MG PO TBEC
40.0000 mg | DELAYED_RELEASE_TABLET | Freq: Every day | ORAL | Status: DC
  Administered 2021-09-07 – 2021-09-13 (×7): 40 mg via ORAL
  Filled 2021-09-06 (×10): qty 1

## 2021-09-06 MED ORDER — ARIPIPRAZOLE 5 MG PO TABS
5.0000 mg | ORAL_TABLET | Freq: Every day | ORAL | Status: DC
  Administered 2021-09-07: 5 mg via ORAL
  Filled 2021-09-06 (×4): qty 1

## 2021-09-06 MED ORDER — LORAZEPAM 1 MG PO TABS
1.0000 mg | ORAL_TABLET | Freq: Four times a day (QID) | ORAL | Status: AC
  Administered 2021-09-06 – 2021-09-07 (×5): 1 mg via ORAL
  Filled 2021-09-06 (×6): qty 1

## 2021-09-06 MED ORDER — ACETAMINOPHEN 325 MG PO TABS
650.0000 mg | ORAL_TABLET | Freq: Four times a day (QID) | ORAL | Status: DC | PRN
  Administered 2021-09-11 – 2021-09-13 (×2): 650 mg via ORAL
  Filled 2021-09-06 (×2): qty 2

## 2021-09-06 MED ORDER — ONDANSETRON 4 MG PO TBDP: 4.0000 mg | ORAL_TABLET | Freq: Four times a day (QID) | ORAL | Status: AC | PRN

## 2021-09-06 MED ORDER — HYDROXYZINE HCL 25 MG PO TABS: 25.0000 mg | ORAL_TABLET | Freq: Four times a day (QID) | ORAL | Status: AC | PRN

## 2021-09-06 MED ORDER — ONDANSETRON HCL 4 MG PO TABS
4.0000 mg | ORAL_TABLET | Freq: Every day | ORAL | Status: DC
  Administered 2021-09-07 – 2021-09-13 (×7): 4 mg via ORAL
  Filled 2021-09-06 (×11): qty 1

## 2021-09-06 MED ORDER — ADULT MULTIVITAMIN W/MINERALS CH
1.0000 | ORAL_TABLET | Freq: Every day | ORAL | Status: DC
  Administered 2021-09-07 – 2021-09-13 (×7): 1 via ORAL
  Filled 2021-09-06 (×10): qty 1

## 2021-09-06 MED ORDER — PRAZOSIN HCL 2 MG PO CAPS
2.0000 mg | ORAL_CAPSULE | Freq: Every day | ORAL | Status: DC
  Administered 2021-09-06 – 2021-09-12 (×7): 2 mg via ORAL
  Filled 2021-09-06 (×9): qty 1

## 2021-09-06 MED ORDER — MESALAMINE 1.2 G PO TBEC
4.8000 g | DELAYED_RELEASE_TABLET | Freq: Every day | ORAL | Status: DC
  Administered 2021-09-07 – 2021-09-13 (×7): 4.8 g via ORAL
  Filled 2021-09-06 (×10): qty 4

## 2021-09-06 MED ORDER — TRAZODONE HCL 50 MG PO TABS
50.0000 mg | ORAL_TABLET | Freq: Every evening | ORAL | Status: DC | PRN
  Administered 2021-09-06 – 2021-09-07 (×2): 50 mg via ORAL
  Filled 2021-09-06 (×2): qty 1

## 2021-09-06 MED ORDER — HYDROXYZINE HCL 25 MG PO TABS: 25.0000 mg | ORAL_TABLET | Freq: Three times a day (TID) | ORAL | Status: DC | PRN

## 2021-09-06 MED ORDER — MAGNESIUM HYDROXIDE 400 MG/5ML PO SUSP: 30.0000 mL | Freq: Every day | ORAL | Status: DC | PRN

## 2021-09-06 MED ORDER — POLYVINYL ALCOHOL 1.4 % OP SOLN
1.0000 [drp] | Freq: Three times a day (TID) | OPHTHALMIC | Status: DC | PRN
  Administered 2021-09-07 – 2021-09-13 (×3): 1 [drp] via OPHTHALMIC

## 2021-09-06 MED ORDER — LORAZEPAM 1 MG PO TABS
1.0000 mg | ORAL_TABLET | Freq: Four times a day (QID) | ORAL | Status: AC | PRN
  Administered 2021-09-06: 1 mg via ORAL

## 2021-09-06 NOTE — ED Provider Notes (Signed)
FBC/OBS ASAP Discharge Summary ? ?Date and Time: 09/06/2021 1:47 PM  ?Name: Jordan Foster  ?MRN:  [id number]  ? ?Discharge Diagnoses:  ?Final diagnoses:  ?Suicidal ideation  ?Severe episode of recurrent major depressive disorder, without psychotic features (HCC)  ?Recurrent major depressive disorder, in partial remission (HCC)  ? ? ?Subjective:  ?Chart reviewed-she has been medication compliant has been appropriate with staff and peers on the unit.  She rates her mood at 3/10 (10 being the best)  and  reports ongoing intermittent suicidal thoughts.  She states that she last had these thoughts yesterday. Denies plan or intent. She expresses that she has not been improving as much as she hoped.  She denies HI/AVH.  She reports sleeping well and states that since she has been at the [facility] her PTSD related nightmares have decreased..  She states that due to the food that is available, she has been experiencing some mucus and blood in her stool.  She states that this is likely due to the diet that she has been receiving while at the [facility] and not being on her medications for the first couple days during admission for ulcerative colitis.  Patient discusses her concerns that she is not improving as much as she was hoping in this setting.  Discussed with patient that she can be recommended for higher level of care.  Patient expressed agreement and understanding.  Patient was agreeable to having information faxed out. ? ?Patient was later accepted to [facility] behavioral health for further treatment-patient was informed of acceptance and was agreeable. ? ?Stay Summary:  ?41 year old Jordan Foster is a female patient with hx of depression, PTSD , SI and TBI who presented to the [facility] on 09/03/21 for SI without a plan in the setting of a recent medication change. UDS+cocaine; etoh 73. Patient was admitted to the [facility] and continued on abilify 5 mg, prazosin 2 mg and other home medications. . On 5/15 she was started on  effexor for depressive sx; see note for further details but she reported numerous failed trials of antidepressants. Patient was admitted to the [facility] ~70 hours with little improvement in her symptoms and a determination was made that she was appropriate for a higher level of care. Patient was accepted to [facility] for continued treatment ? ?Total Time spent with patient: 30 minutes ? ?Past Psychiatric History: depression, PTSD, TBI, anxiety ?Past Medical History:  ?Past Medical History:  ?Diagnosis Date  ? Asthma   ? Depression   ? Hypertension   ? Positive TB test   ?  ?Past Surgical History:  ?Procedure Laterality Date  ? LASIK    ? NASAL SINUS SURGERY    ? ?Family History:  ?Family History  ?Problem Relation Age of Onset  ? Hypertension Mother   ? ?Family Psychiatric History:  ?Denies psychiatric history ?Denies substance use history ?Denies history of attempted or completed suicides ?Social History:  ?Social History  ? ?Substance and Sexual Activity  ?Alcohol Use Yes  ? Alcohol/week: 35.0 standard drinks  ? Types: 35 Shots of liquor per week  ?   ?Social History  ? ?Substance and Sexual Activity  ?Drug Use Yes  ? Frequency: 3.0 times per week  ? Types: Marijuana  ?  ?Social History  ? ?Socioeconomic History  ? Marital status: Single  ?  Spouse name: Not on file  ? Number of children: 0  ? Years of education: 65  ? Highest education level: Not on file  ?  Occupational History  ? Occupation: [employer]   ?Tobacco Use  ? Smoking status: Never  ? Smokeless tobacco: Never  ?Vaping Use  ? Vaping Use: Never used  ?Substance and Sexual Activity  ? Alcohol use: Yes  ?  Alcohol/week: 35.0 standard drinks  ?  Types: 35 Shots of liquor per week  ? Drug use: Yes  ?  Frequency: 3.0 times per week  ?  Types: Marijuana  ? Sexual activity: Yes  ?  Comment: W/ Female Partner  ?Other Topics Concern  ? Not on file  ?Social History Narrative  ? Fun/Hobby: Working to determine  ? ?Social Determinants of Health  ? ?Financial  Resource Strain: Not on file  ?Food Insecurity: Not on file  ?Transportation Needs: Not on file  ?Physical Activity: Not on file  ?Stress: Not on file  ?Social Connections: Not on file  ? ?SDOH:  ?SDOH Screenings  ? ?Alcohol Screen: Not on file  ?Depression (PHQ2-9): Medium Risk  ? PHQ-2 Score: 19  ?Financial Resource Strain: Not on file  ?Food Insecurity: Not on file  ?Housing: Not on file  ?Physical Activity: Not on file  ?Social Connections: Not on file  ?Stress: Not on file  ?Tobacco Use: Low Risk   ? Smoking Tobacco Use: Never  ? Smokeless Tobacco Use: Never  ? Passive Exposure: Not on file  ?Transportation Needs: Not on file  ? ? ?Tobacco Cessation:  N/A, patient does not currently use tobacco products ? ?Current Medications:  ?Current Facility-Administered Medications  ?Medication Dose Route Frequency Provider Last Rate Last Admin  ? acetaminophen (TYLENOL) tablet 650 mg  650 mg Oral Q6H PRN Estella Husk, MD   650 mg at 09/05/21 1015  ? albuterol (VENTOLIN HFA) 108 (90 Base) MCG/ACT inhaler 2 puff  2 puff Inhalation Q4H PRN Estella Husk, MD      ? ARIPiprazole (ABILIFY) tablet 5 mg  5 mg Oral Daily White, Patrice L, NP   5 mg at 09/06/21 1008  ? chlordiazePOXIDE (LIBRIUM) capsule 25 mg  25 mg Oral Q6H PRN White, Patrice L, NP      ? hydrOXYzine (ATARAX) tablet 25 mg  25 mg Oral Q6H PRN White, Patrice L, NP      ? loperamide (IMODIUM) capsule 2-4 mg  2-4 mg Oral PRN White, Patrice L, NP      ? losartan (COZAAR) tablet 50 mg  50 mg Oral Daily Estella Husk, MD   50 mg at 09/06/21 1008  ? mesalamine (LIALDA) EC tablet 4.8 g  4.8 g Oral Daily Estella Husk, MD   4.8 g at 09/06/21 1011  ? mometasone-formoterol (DULERA) 200-5 MCG/ACT inhaler 2 puff  2 puff Inhalation BID Estella Husk, MD   2 puff at 09/06/21 1012  ? multivitamin with minerals tablet 1 tablet  1 tablet Oral Daily White, Patrice L, NP   1 tablet at 09/06/21 1008  ? ondansetron (ZOFRAN-ODT) disintegrating tablet 4  mg  4 mg Oral Q6H PRN White, Patrice L, NP      ? pantoprazole (PROTONIX) EC tablet 40 mg  40 mg Oral Daily Estella Husk, MD   40 mg at 09/06/21 1008  ? prazosin (MINIPRESS) capsule 2 mg  2 mg Oral QHS White, Patrice L, NP   2 mg at 09/05/21 2055  ? propranolol ER (INDERAL LA) 24 hr capsule 60 mg  60 mg Oral Daily Estella Husk, MD   60 mg at 09/06/21 1008  ?  thiamine tablet 100 mg  100 mg Oral Daily White, Patrice L, NP   100 mg at 09/06/21 1008  ? traZODone (DESYREL) tablet 50 mg  50 mg Oral QHS PRN White, Patrice L, NP   50 mg at 09/05/21 2055  ? venlafaxine XR (EFFEXOR-XR) 24 hr capsule 37.5 mg  37.5 mg Oral Q breakfast Estella HuskLaubach, Han Vejar S, MD   37.5 mg at 09/06/21 1008  ? ?Current Outpatient Medications  ?Medication Sig Dispense Refill  ? albuterol (VENTOLIN HFA) 108 (90 Base) MCG/ACT inhaler Inhale 2 puffs into the lungs every 4 (four) hours as needed.    ? ARIPiprazole (ABILIFY) 5 MG tablet Take 5 mg by mouth daily.    ? budesonide-formoterol (SYMBICORT) 160-4.5 MCG/ACT inhaler Inhale 2 puffs into the lungs 2 (two) times daily.    ? carboxymethylcellulose (REFRESH PLUS) 0.5 % SOLN Place 1 drop into both eyes 3 (three) times daily as needed.    ? cetirizine (ZYRTEC) 10 MG tablet Take 10 mg by mouth daily.    ? losartan (COZAAR) 50 MG tablet Take 50 mg by mouth daily.    ? mesalamine (LIALDA) 1.2 g EC tablet Take 4.8 g by mouth daily.    ? ondansetron (ZOFRAN) 4 MG tablet Take 1 tablet by mouth daily.    ? pantoprazole (PROTONIX) 40 MG tablet Take 40 mg by mouth daily.    ? prazosin (MINIPRESS) 2 MG capsule Take 2 mg by mouth at bedtime.    ? propranolol ER (INDERAL LA) 60 MG 24 hr capsule Take 60 mg by mouth daily.    ? traZODone (DESYREL) 50 MG tablet TAKE 1 TABLET (50 MG TOTAL) BY MOUTH AT BEDTIME AS NEEDED FOR SLEEP. 90 tablet 0  ? [START ON 09/07/2021] venlafaxine XR (EFFEXOR-XR) 37.5 MG 24 hr capsule Take 1 capsule (37.5 mg total) by mouth daily with breakfast.    ? ? ?PTA Medications: (Not  in a hospital admission) ? ? ?Musculoskeletal  ?Strength & Muscle Tone: within normal limits ?Gait & Station: normal ?Patient leans: N/A ? ?Psychiatric Specialty Exam  ?Presentation  ?General Appearance

## 2021-09-06 NOTE — Group Note (Signed)
Recreation Therapy Group Note ? ? ?Group Topic:Animal Assisted Therapy   ?Group Date: 09/06/2021 ?Start Time: 1430 ?End Time: 1510 ?Facilitators: Caroll Rancher, LRT,CTRS ?Location: [address] Dayroom ? ? ?Animal-Assisted Activity (AAA) Program Checklist/Progress Note ?Patient Eligibility Criteria Checklist & Daily Group note for Rec Tx Intervention ? ? ?AAA/T Program Assumption of Risk Form signed by Patient/ or Parent Legal Guardian YES ? ?Patient is free of allergies or severe asthma  YES ? ?Patient reports no fear of animals YES ? ?Patient reports no history of cruelty to animals YES ? ?Patient understands their participation is voluntary YES ? ?Patient washes hands before animal contact YES ? ?Patient washes hands after animal contact YES ? ? ? ?Group Description: Patients provided opportunity to interact with trained and credentialed [employer] Therapy dog and the community volunteer/dog handler. Patients practiced appropriate animal interaction and were educated on dog safety outside of the hospital in common community settings. Patients were allowed to use dog toys and other items to practice commands, engage the dog in play, and/or complete routine aspects of animal care.  ? ?Education: Charity fundraiser, Health visitor, Communication & Social Skills  ? ? ?Affect/Mood: Appropriate ?  ?Participation Level: Engaged ?  ? ?Clinical Observations/Individualized Feedback: Pt is a new admit.  Pt came for the last few minutes of group.  ? ? ?Plan: Continue to engage patient in RT group sessions 2-3x/week. ? ? ?Caroll Rancher, LRT,CTRS ?09/06/2021 3:20 PM ?
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
## 2021-09-06 NOTE — Progress Notes (Signed)
Jordan Foster was transported to [facility] via [employer]. She received her personal belongings prior to her departure. ?

## 2021-09-06 NOTE — Progress Notes (Signed)
?   09/06/21 2326  ?Psych Admission Type (Psych Patients Only)  ?Admission Status Voluntary  ?Psychosocial Assessment  ?Patient Complaints Depression  ?Eye Contact Fair  ?Facial Expression Anxious  ?Affect Depressed  ?Speech Logical/coherent  ?Interaction Minimal  ?Motor Activity Other (Comment)  ?Appearance/Hygiene Unremarkable  ?Behavior Characteristics Cooperative  ?Mood Anxious  ?Thought Process  ?Coherency WDL  ?Content WDL  ?Delusions None reported or observed  ?Perception WDL  ?Hallucination None reported or observed  ?Judgment Impaired  ?Confusion None  ?Danger to Self  ?Current suicidal ideation? Denies  ?Self-Injurious Behavior No self-injurious ideation or behavior indicators observed or expressed   ?Agreement Not to Harm Self Yes  ?Description of Agreement verbal contract  ?Danger to Others  ?Danger to Others None reported or observed  ? ?D: Patient alert and cooperative. Pt denies any withdrawal symptoms except anxiety.  ?A: Medications administered as prescribed. Support and encouragement provided as needed.  ?R: Patient remains safe on the unit. Will continue to monitor for safety and stability.   ?

## 2021-09-06 NOTE — Discharge Instructions (Signed)
Transfer to [facility] 

## 2021-09-06 NOTE — Tx Team (Signed)
Initial Treatment Plan ?09/06/2021 ?4:28 PM ?[place] ?[id number] ? ? ? ?PATIENT STRESSORS: ?Health problems   ?Marital or family conflict   ?Substance abuse   ?Traumatic event   ? ? ?PATIENT STRENGTHS: ?Average or above average intelligence  ?Supportive family/friends  ? ? ?PATIENT IDENTIFIED PROBLEMS: ?"I want to make sure my medicine is working"  ?Depressed  ?"Need to find a therapist."  ?Substance use  ?  ?  ?  ?  ?  ?  ? ?DISCHARGE CRITERIA:  ?Ability to meet basic life and health needs ?Adequate post-discharge living arrangements ?Improved stabilization in mood, thinking, and/or behavior ?Motivation to continue treatment in a less acute level of care ? ?PRELIMINARY DISCHARGE PLAN: ?Attend aftercare/continuing care group ?Attend PHP/IOP ?Attend 12-step recovery group ? ?PATIENT/FAMILY INVOLVEMENT: ?This treatment plan has been presented to and reviewed with the patient, Jordan Foster.  The patient has been given the opportunity to ask questions and make suggestions. ? ?Luna Glasgow, RN ?09/06/2021, 4:28 PM ?

## 2021-09-06 NOTE — Plan of Care (Signed)
?  Problem: Education: ?Goal: Knowledge of [facility] Education information/materials will improve ?Outcome: Progressing ?  ?Problem: Health Behavior/Discharge Planning: ?Goal: Identification of resources available to assist in meeting health care needs will improve ?Outcome: Progressing ?  ?

## 2021-09-06 NOTE — Clinical Social Work Psych Note (Signed)
LCSW Update Note ? ?Jordan Foster has been accepted to [facility] for inpatient psychiatric treatment services.  ? ?Accepting Provider: Dr. Serafina Mitchell, MD ?Attending Provider: Dr. Mamie Levers, MD ? ?Bed Assignment: Room 303 - 1 ?Arrival Time: 13:00 ? ?Nurse to Nurse Report Number: [phone] ?Please have voluntary consent for faced to [facility] at [phone].  ? ?Patient is aware and is agreeable with transfer.  ? ? ?Dr. Serafina Mitchell, MD notified ?Roxy Manns, RN notified.  ?LCSW notified.  ? ? ?Radonna Ricker, MSW, LCSW ?Clinical Education officer, museum Insurance claims handler) ?[facility]  ? ?

## 2021-09-06 NOTE — Progress Notes (Signed)
Jordan Foster was cooperative with the repeat Covid test. VS rechecked. She is pleasant and calm in the dinning room socializing with her peer. She signed the voluntary agreement to go to [facility].  ?

## 2021-09-06 NOTE — Progress Notes (Signed)
Pt is a 41 y.o female who was voluntarily admitted at [facility] d/t SI with no plan.  Pt said that her psychotropic medications  were changed about 6 months ago, and as a result of the medication changes, pt has had increased depression, anxiety, and SI.  Pt was transferred from [facility] to [facility], and pt denied SI on admission at [facility]. Pt says she drinks ETOH every day -- about 7 shots of Vodka per day--when she is depressed.  Pt has been depressed for several weeks prior to admission. Pt said she has been depressed because two of her friends recently died from an OD. Pt disclosed that she  also recently used Cocaine prior to admission but says "that's not something I typically use."  Pt sees a psych NP at [facility] in [place].  Pt is pleasant and cooperative during admission process.  Pt signed consent forms.  RN oriented pt to the unit, her room and to the staff.   ?

## 2021-09-06 NOTE — Progress Notes (Signed)
Received Jordan Foster this AM asleep in her bed, she woke up on her own, received breakfast and returned to her room. Later she was medicated per order.She endorsed feeling less anxious, depressed and suicidal since her admission.  She remained OOB in the milieu coloring and watching the TV. ?

## 2021-09-06 NOTE — Group Note (Unsigned)
Date:  09/06/2021 ?Time:  6:25 PM ? ?Group Topic/Focus:  ?Orientation:   The focus of this group is to educate the patient on the purpose and policies of crisis stabilization and provide a format to answer questions about their admission.  The group details unit policies and expectations of patients while admitted. ? ? ? ? ?Participation Level:  {BHH PARTICIPATION LEVEL:22264} ? ?Participation Quality:  {BHH PARTICIPATION QUALITY:22265} ? ?Affect:  {BHH AFFECT:22266} ? ?Cognitive:  {BHH COGNITIVE:22267} ? ?Insight: {BHH Insight2:20797} ? ?Engagement in Group:  {BHH ENGAGEMENT IN GROUP:22268} ? ?Modes of Intervention:  {BHH MODES OF INTERVENTION:22269} ? ?Additional Comments:  *** ? ?Takara Sermons Lashawn Camauri Craton ?09/06/2021, 6:25 PM ? ?

## 2021-09-07 ENCOUNTER — Encounter (HOSPITAL_COMMUNITY): Payer: Self-pay

## 2021-09-07 DIAGNOSIS — M546 Pain in thoracic spine: Secondary | ICD-10-CM | POA: Insufficient documentation

## 2021-09-07 DIAGNOSIS — F332 Major depressive disorder, recurrent severe without psychotic features: Secondary | ICD-10-CM

## 2021-09-07 DIAGNOSIS — F4310 Post-traumatic stress disorder, unspecified: Secondary | ICD-10-CM | POA: Diagnosis present

## 2021-09-07 DIAGNOSIS — F4312 Post-traumatic stress disorder, chronic: Secondary | ICD-10-CM | POA: Diagnosis present

## 2021-09-07 DIAGNOSIS — M5380 Other specified dorsopathies, site unspecified: Secondary | ICD-10-CM | POA: Insufficient documentation

## 2021-09-07 DIAGNOSIS — M25569 Pain in unspecified knee: Secondary | ICD-10-CM | POA: Insufficient documentation

## 2021-09-07 DIAGNOSIS — F1010 Alcohol abuse, uncomplicated: Secondary | ICD-10-CM | POA: Diagnosis present

## 2021-09-07 DIAGNOSIS — K5190 Ulcerative colitis, unspecified, without complications: Secondary | ICD-10-CM

## 2021-09-07 DIAGNOSIS — F1410 Cocaine abuse, uncomplicated: Secondary | ICD-10-CM | POA: Diagnosis present

## 2021-09-07 MED ORDER — ARIPIPRAZOLE 10 MG PO TABS
10.0000 mg | ORAL_TABLET | Freq: Every day | ORAL | Status: DC
  Administered 2021-09-08 – 2021-09-13 (×6): 10 mg via ORAL
  Filled 2021-09-07 (×9): qty 1

## 2021-09-07 MED ORDER — GABAPENTIN 100 MG PO CAPS
200.0000 mg | ORAL_CAPSULE | Freq: Three times a day (TID) | ORAL | Status: DC
  Administered 2021-09-07 – 2021-09-09 (×7): 200 mg via ORAL
  Filled 2021-09-07 (×13): qty 2

## 2021-09-07 MED ORDER — DESVENLAFAXINE SUCCINATE ER 50 MG PO TB24
50.0000 mg | ORAL_TABLET | Freq: Every day | ORAL | Status: DC
  Administered 2021-09-07 – 2021-09-13 (×7): 50 mg via ORAL
  Filled 2021-09-07 (×12): qty 1

## 2021-09-07 MED ORDER — DESVENLAFAXINE SUCCINATE ER 25 MG PO TB24: 25.0000 mg | ORAL_TABLET | Freq: Every day | ORAL | Status: DC

## 2021-09-07 NOTE — BHH Group Notes (Signed)
Pt attended and contributed to group. ?Scale 6-10 ?Wants to work on her medication regiment. ?
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
## 2021-09-07 NOTE — BH IP Treatment Plan (Signed)
Interdisciplinary Treatment and Diagnostic Plan Update ? ?09/07/2021 ?Time of Session: 9:10am  ?[place] ?MRN: [id number] ? ?Principal Diagnosis: MDD (major depressive disorder), recurrent episode ([place]) ? ?Secondary Diagnoses: Principal Problem: ?  MDD (major depressive disorder), recurrent episode ([place]) ? ? ?Current Medications:  ?Current Facility-Administered Medications  ?Medication Dose Route Frequency Provider Last Rate Last Admin  ? acetaminophen (TYLENOL) tablet 650 mg  650 mg Oral Q6H PRN Ival Bible, MD      ? albuterol (VENTOLIN HFA) 108 (90 Base) MCG/ACT inhaler 2 puff  2 puff Inhalation Q4H PRN Ival Bible, MD      ? alum & mag hydroxide-simeth (MAALOX/MYLANTA) 200-200-20 MG/5ML suspension 30 mL  30 mL Oral Q4H PRN Ival Bible, MD      ? ARIPiprazole (ABILIFY) tablet 5 mg  5 mg Oral Daily Minda Ditto, RPH   5 mg at 09/07/21 4098  ? hydrOXYzine (ATARAX) tablet 25 mg  25 mg Oral Q6H PRN Massengill, Ovid Curd, MD      ? loperamide (IMODIUM) capsule 2-4 mg  2-4 mg Oral PRN Massengill, Ovid Curd, MD      ? loratadine (CLARITIN) tablet 10 mg  10 mg Oral Daily Ival Bible, MD   10 mg at 09/07/21 1191  ? LORazepam (ATIVAN) tablet 1 mg  1 mg Oral Q6H PRN Janine Limbo, MD   1 mg at 09/06/21 1632  ? LORazepam (ATIVAN) tablet 1 mg  1 mg Oral QID Massengill, Ovid Curd, MD   1 mg at 09/07/21 0739  ? Followed by  ? [START ON 09/08/2021] LORazepam (ATIVAN) tablet 1 mg  1 mg Oral TID Massengill, Ovid Curd, MD      ? Followed by  ? Derrill Memo ON 09/09/2021] LORazepam (ATIVAN) tablet 1 mg  1 mg Oral BID Massengill, Nathan, MD      ? Followed by  ? Derrill Memo ON 09/10/2021] LORazepam (ATIVAN) tablet 1 mg  1 mg Oral Daily Massengill, Nathan, MD      ? losartan (COZAAR) tablet 50 mg  50 mg Oral Daily Minda Ditto, RPH   50 mg at 09/07/21 4782  ? magnesium hydroxide (MILK OF MAGNESIA) suspension 30 mL  30 mL Oral Daily PRN Ival Bible, MD      ? mesalamine (LIALDA) EC tablet 4.8 g   4.8 g Oral Daily Graylin Shiver L, RPH   4.8 g at 09/07/21 9562  ? mometasone-formoterol (DULERA) 200-5 MCG/ACT inhaler 2 puff  2 puff Inhalation BID Ival Bible, MD   2 puff at 09/07/21 0736  ? multivitamin with minerals tablet 1 tablet  1 tablet Oral Daily Massengill, Ovid Curd, MD   1 tablet at 09/07/21 0735  ? ondansetron (ZOFRAN) tablet 4 mg  4 mg Oral Daily Ival Bible, MD   4 mg at 09/07/21 0734  ? ondansetron (ZOFRAN-ODT) disintegrating tablet 4 mg  4 mg Oral Q6H PRN Massengill, Nathan, MD      ? pantoprazole (PROTONIX) EC tablet 40 mg  40 mg Oral Daily Ival Bible, MD   40 mg at 09/07/21 0735  ? polyvinyl alcohol (LIQUIFILM TEARS) 1.4 % ophthalmic solution 1 drop  1 drop Both Eyes TID PRN Ival Bible, MD      ? prazosin (MINIPRESS) capsule 2 mg  2 mg Oral QHS Ival Bible, MD   2 mg at 09/06/21 2111  ? propranolol ER (INDERAL LA) 24 hr capsule 60 mg  60 mg Oral Daily Ival Bible, MD  60 mg at 09/07/21 0734  ? thiamine tablet 100 mg  100 mg Oral Daily Massengill, Ovid Curd, MD   100 mg at 09/07/21 0735  ? traZODone (DESYREL) tablet 50 mg  50 mg Oral QHS PRN Ival Bible, MD   50 mg at 09/06/21 2111  ? venlafaxine XR (EFFEXOR-XR) 24 hr capsule 37.5 mg  37.5 mg Oral Q breakfast Ival Bible, MD   37.5 mg at 09/07/21 5784  ? ?PTA Medications: ?Medications Prior to Admission  ?Medication Sig Dispense Refill Last Dose  ? albuterol (VENTOLIN HFA) 108 (90 Base) MCG/ACT inhaler Inhale 2 puffs into the lungs every 4 (four) hours as needed.     ? ARIPiprazole (ABILIFY) 5 MG tablet Take 5 mg by mouth daily.     ? budesonide-formoterol (SYMBICORT) 160-4.5 MCG/ACT inhaler Inhale 2 puffs into the lungs 2 (two) times daily.     ? carboxymethylcellulose (REFRESH PLUS) 0.5 % SOLN Place 1 drop into both eyes 3 (three) times daily as needed.     ? cetirizine (ZYRTEC) 10 MG tablet Take 10 mg by mouth daily.     ? losartan (COZAAR) 50 MG tablet Take 50 mg by mouth  daily.     ? mesalamine (LIALDA) 1.2 g EC tablet Take 4.8 g by mouth daily.     ? ondansetron (ZOFRAN) 4 MG tablet Take 1 tablet by mouth daily.     ? pantoprazole (PROTONIX) 40 MG tablet Take 40 mg by mouth daily.     ? prazosin (MINIPRESS) 2 MG capsule Take 2 mg by mouth at bedtime.     ? propranolol ER (INDERAL LA) 60 MG 24 hr capsule Take 60 mg by mouth daily.     ? traZODone (DESYREL) 50 MG tablet TAKE 1 TABLET (50 MG TOTAL) BY MOUTH AT BEDTIME AS NEEDED FOR SLEEP. 90 tablet 0   ? venlafaxine XR (EFFEXOR-XR) 37.5 MG 24 hr capsule Take 1 capsule (37.5 mg total) by mouth daily with breakfast.     ? ? ?Patient Stressors: Health problems   ?Marital or family conflict   ?Substance abuse   ?Traumatic event   ? ?Patient Strengths: Average or above average intelligence  ?Supportive family/friends  ? ?Treatment Modalities: Medication Management, Group therapy, Case management,  ?1 to 1 session with clinician, Psychoeducation, Recreational therapy. ? ? ?Physician Treatment Plan for Primary Diagnosis: MDD (major depressive disorder), recurrent episode ([place]) ?Long Term Goal(s):    ? ?Short Term Goals:   ? ?Medication Management: Evaluate patient's response, side effects, and tolerance of medication regimen. ? ?Therapeutic Interventions: 1 to 1 sessions, Unit Group sessions and Medication administration. ? ?Evaluation of Outcomes: Not Met ? ?Physician Treatment Plan for Secondary Diagnosis: Principal Problem: ?  MDD (major depressive disorder), recurrent episode ([place]) ? ?Long Term Goal(s):    ? ?Short Term Goals:      ? ?Medication Management: Evaluate patient's response, side effects, and tolerance of medication regimen. ? ?Therapeutic Interventions: 1 to 1 sessions, Unit Group sessions and Medication administration. ? ?Evaluation of Outcomes: Not Met ? ? ?RN Treatment Plan for Primary Diagnosis: MDD (major depressive disorder), recurrent episode ([place]) ?Long Term Goal(s): Knowledge of disease and therapeutic regimen to  maintain health will improve ? ?Short Term Goals: Ability to remain free from injury will improve, Ability to participate in decision making will improve, Ability to verbalize feelings will improve, Ability to disclose and discuss suicidal ideas, and Ability to identify and develop effective coping behaviors will improve ? ?Medication Management:  RN will administer medications as ordered by provider, will assess and evaluate patient's response and provide education to patient for prescribed medication. RN will report any adverse and/or side effects to prescribing provider. ? ?Therapeutic Interventions: 1 on 1 counseling sessions, Psychoeducation, Medication administration, Evaluate responses to treatment, Monitor vital signs and CBGs as ordered, Perform/monitor CIWA, COWS, AIMS and Fall Risk screenings as ordered, Perform wound care treatments as ordered. ? ?Evaluation of Outcomes: Not Met ? ? ?LCSW Treatment Plan for Primary Diagnosis: MDD (major depressive disorder), recurrent episode ([place]) ?Long Term Goal(s): Safe transition to appropriate next level of care at discharge, Engage patient in therapeutic group addressing interpersonal concerns. ? ?Short Term Goals: Engage patient in aftercare planning with referrals and resources, Increase social support, Increase emotional regulation, Facilitate acceptance of mental health diagnosis and concerns, Identify triggers associated with mental health/substance abuse issues, and Increase skills for wellness and recovery ? ?Therapeutic Interventions: Assess for all discharge needs, 1 to 1 time with Education officer, museum, Explore available resources and support systems, Assess for adequacy in community support network, Educate family and significant other(s) on suicide prevention, Complete Psychosocial Assessment, Interpersonal group therapy. ? ?Evaluation of Outcomes: Not Met ? ? ?Progress in Treatment: ?Attending groups: Yes. ?Participating in groups: Yes. ?Taking medication as  prescribed: Yes. ?Toleration medication: Yes. ?Family/Significant other contact made: Yes, individual(s) contacted:  Girlfriend  ?Patient understands diagnosis: Yes. ?Discussing patient identified problems/goal

## 2021-09-07 NOTE — Progress Notes (Signed)
D:  Patient's self inventory sheet, patient sleeps good, sleep medication helpful.  Fair appetite, low energy level, poor concentration.  Rated depression, hopeless and anxiety 5.  Denied withdrawals.  SI, sometimes, contracts for safety.  Physical problems, back, worst pain #6 in past 24 hours. Goal is finding a purpose.  Plans to talk to others, self-reflection.  Does have discharge plans. ?A:  Medications administered per MD orders.  Emotional support and encouragement given patient. ?R:  Denied SI while talking to nurse this morning, contracts for safety.  Denied HI.  Denied A/V hallucinations.  Safety maintained with 15 minute checks. ? ?
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
## 2021-09-07 NOTE — Group Note (Signed)
Recreation Therapy Group Note ? ? ?Group Topic:Problem Solving  ?Group Date: 09/07/2021 ?Start Time: 0930 ?End Time: 0950 ?Facilitators: Caroll Rancher, LRT,CTRS ?Location: [address] Dayroom ? ? ?Goal Area(s) Addresses:  ?Patient will effectively work with peer towards shared goal.  ?Patient will identify skills used to make activity successful.  ?Patient will identify how skills used during activity can be used to reach post d/c goals.  ? ?Group Description: Straw Bridge. In teams of 3-5, patients were given 15 plastic drinking straws and an equal length of masking tape. Using the materials provided, patients were instructed to build a free standing bridge-like structure to suspend an everyday item (ex: puzzle box) off of the floor or table surface. All materials were required to be used by the team in their design. LRT facilitated post-activity discussion reviewing team process. Patients were encouraged to reflect how the skills used in this activity can be generalized to daily life post discharge. ? ? ?Affect/Mood: Appropriate ?  ?Participation Level: Engaged ?  ?Participation Quality: Independent ?  ?Behavior: Appropriate ?  ?Speech/Thought Process: Focused ?  ?Insight: Good ?  ?Judgement: Good ?  ?Modes of Intervention: STEM Activity ?  ?Patient Response to Interventions:  Engaged ?  ?Education Outcome: ? Acknowledges education and In group clarification offered   ? ?Clinical Observations/Individualized Feedback: Pt was bright and engaged with activity.  Pt worked well with peers in coming up with a Estate agent.  Pt expressed she has a habit of holding her feelings in and the activity gave a visual of how you can work through problems with the help of others.   ?  ? ?Plan: Continue to engage patient in RT group sessions 2-3x/week. ? ? ?Caroll Rancher, LRT,CTRS ?09/07/2021 11:44 AM ?

## 2021-09-07 NOTE — [facility] Suicide Risk Assessment (Signed)
Suicide Risk Assessment ? ?Admission Assessment    ?[facility] Admission Suicide Risk Assessment ? ? ?Nursing information obtained from:  Patient ?Demographic factors:  Caucasian, Gay, lesbian, or bisexual orientation, Unemployed ?Current Mental Status:  NA ?Loss Factors:  Loss of significant relationship ?Historical Factors:  Prior suicide attempts ?Risk Reduction Factors:  Positive social support, Living with another person, especially a relative ? ?Total Time spent with patient: 1 hour ?Principal Problem: MDD (major depressive disorder), recurrent episode (HCC) ?Diagnosis:  Principal Problem: ?  MDD (major depressive disorder), recurrent episode (HCC) ?Active Problems: ?  Anxiety ?  Post-traumatic stress disorder, chronic ?  Alcohol abuse ?  Cocaine abuse (HCC) ?  Ulcerative colitis (HCC) ? ?History of Present Illness: Jordan Foster is a 41 y.o. Caucasian female with a history of PTSD, MDD, GAD & polysubstance abuse who presented to the [facility] urgent [facility]) with complaints of worsening depressive symptoms and +SI. Pt denied having a plan this time, but reports a suicide attempt in 2018 via overdosing on 3 months supply of her medications & cocaine, leading to "flat-lining" for 7 minutes. Pt was transferred to this [facility] for treatment and stabilization of her mood. ? ?Continued Clinical Symptoms: Pt reports worsening feelings of sadness, anhedonia, decreased motivation, hopelessness, helplessness, frustration, worthlessness, poor motivation, low energy levels, poor appetite, anxiety, insomnia over the past year. Pt continues to endorse passive SI, but denies having a plan. Her current symptoms render her at high risk of danger to self and continuous hospitalization is necessary to treat and stabilize her mood. ? ?Alcohol Use Disorder Identification Test Final Score (AUDIT): 30 ?The "Alcohol Use Disorders Identification Test", Guidelines for Use in Primary  Care, Second Edition.  World Science writer [facility]). ?Score between 0-7:  no or low risk or alcohol related problems. ?Score between 8-15:  moderate risk of alcohol related problems. ?Score between 16-19:  high risk of alcohol related problems. ?Score 20 or above:  warrants further diagnostic evaluation for alcohol dependence and treatment. ? ?CLINICAL FACTORS:  ? Depression:   Anhedonia ?Comorbid alcohol abuse/dependence ?Hopelessness ?Impulsivity ?Insomnia ? ?Musculoskeletal: ?Strength & Muscle Tone: within normal limits ?Gait & Station: normal ?Patient leans: N/A ? ?Psychiatric Specialty Exam: ? ?Presentation  ?General Appearance: Appropriate for Environment ? ?Eye Contact:Good ? ?Speech:Clear and Coherent ? ?Speech Volume:Normal ? ?Handedness:Right ? ? ?Mood and Affect  ?Mood:Anxious; Depressed ? ?Affect:Congruent ? ? ?Thought Process  ?Thought Processes:Coherent ? ?Descriptions of Associations:Intact ? ?Orientation:Full (Time, Place and Person) ? ?Thought Content:Logical ? ?History of Schizophrenia/Schizoaffective disorder:No ? ?Duration of Psychotic Symptoms:No data recorded ?Hallucinations:Hallucinations: None ? ?Ideas of Reference:None ? ?Suicidal Thoughts:Suicidal Thoughts: Yes, Passive ?SI Passive Intent and/or Plan: Without Intent; Without Plan ? ?Homicidal Thoughts:Homicidal Thoughts: No ? ?Sensorium  ?Memory:Immediate Good ? ?Judgment:Fair ? ?Insight:Fair ? ?Executive Functions  ?Concentration:Fair ? ?Attention Span:Fair ? ?Recall:Fair ? ?Fund of Knowledge:Fair ? ?Language:Fair ? ?Psychomotor Activity  ?Psychomotor Activity:Psychomotor Activity: Normal ? ?Assets  ?Assets:Communication Skills; Housing ? ?Sleep  ?Sleep:Sleep: Fair ? ?Physical Exam: ?Physical Exam ?Constitutional:   ?   Appearance: Normal appearance.  ?HENT:  ?   Head: Normocephalic.  ?   Nose: Nose normal. No congestion or rhinorrhea.  ?Eyes:  ?   Pupils: Pupils are equal, round, and reactive to light.  ?Pulmonary:  ?   Effort:  Pulmonary effort is normal.  ?Musculoskeletal:     ?   General: Normal range of motion.  ?   Cervical back: Normal range of motion.  No rigidity.  ?Neurological:  ?   Mental Status: She is alert and oriented to person, place, and time.  ?Psychiatric:     ?   Behavior: Behavior normal.  ? ?Review of Systems  ?Constitutional: Negative.   ?HENT: Negative.    ?Eyes: Negative.   ?Respiratory: Negative.    ?Cardiovascular: Negative.   ?Gastrointestinal: Negative.   ?Genitourinary: Negative.   ?Musculoskeletal: Negative.   ?Skin: Negative.   ?Neurological: Negative.   ?Psychiatric/Behavioral:  Positive for depression, substance abuse and suicidal ideas. Negative for hallucinations and memory loss. The patient is nervous/anxious and has insomnia.   ?Blood pressure (!) 143/85, pulse 80, temperature 98 ?F (36.7 ?C), temperature source Oral, resp. rate 16, height 5\' 7"  (1.702 m), weight 68.2 kg, SpO2 100 %. Body mass index is 23.56 kg/m?. ? ?COGNITIVE FEATURES THAT CONTRIBUTE TO RISK:  ?None   ? ?SUICIDE RISK:  ? Moderate:  Frequent suicidal ideation with limited intensity, and duration, some specificity in terms of plans, no associated intent, good self-control, limited dysphoria/symptomatology, some risk factors present, and identifiable protective factors, including available and accessible social support. ? ?PLAN OF CARE:  ?PLAN ?Safety and Monitoring: ?Voluntary admission to inpatient psychiatric unit for safety, stabilization and treatment ?Daily contact with patient to assess and evaluate symptoms and progress in treatment ?Patient's case to be discussed in multi-disciplinary team meeting ?Observation Level : q15 minute checks ?Vital signs: q12 hours ?Precautions: suicide ?  ?Long Term Goal(s): Improvement in symptoms so as ready for discharge ?  ?Short Term Goals: Ability to identify changes in lifestyle to reduce recurrence of condition will improve, Ability to disclose and discuss suicidal ideas, Ability to  demonstrate self-control will improve, Ability to identify and develop effective coping behaviors will improve, Compliance with prescribed medications will improve, and Ability to identify triggers associated with substance abuse/mental health issues will improve ?  ?      Diagnoses ?Principal Problem: ?  MDD (major depressive disorder), recurrent episode (HCC) ?Active Problems: ?  Anxiety ?  Post-traumatic stress disorder, chronic ?  Alcohol abuse ?  Cocaine abuse (HCC) ?  Ulcerative colitis (HCC) ?  ?                         Medications ?-Continue Hydroxyzine 25 mg every 6 hours PRN ?-Discontinue Effexor d/t h/o hypertension ?-Start Prestiq 50 mg daily for MDD ?-Start Gabapentin 200 mg TID for anxiety/ETOH use d/o ?-Monitor for signs of withdrawal ?-Continue ETOH Ativan Detox protocol as per [facility] ?- Oral thiamine and MVI replacement as per MAR ?-Continue Claritin 10 mg daily for seasonal allergies ?-Continue Losartan 50 mg daily for tachycardia ?-Continue Inderal 60 mg daily for hypertension ?- Abstinence from substances encouraged  ?- SW to look into options for outpatient SA treatment at discharge  ?-Continue Trazodone 50 mg nightly PRN for insomnia ?-Continue Mesalamine EC 4.8 g daily for  ulcerative colitis ?-Continue albuterol inhaler PRN for wheezing/SOB ?  ?Other PRNS ?-Continue Tylenol 650 mg every 6 hours PRN for mild pain ?-Continue Maalox 30 mg every 4 hrs PRN for indigestion ?-Continue Imodium 2-4 mg as needed for diarrhea ?-Continue Milk of Magnesia as needed every 6 hrs for constipation ?-Continue Zofran disintegrating tabs every 6 hrs PRN for nausea  ?  ?Discharge Planning: ?Social work and case management to assist with discharge planning and identification of hospital follow-up needs prior to discharge ?Estimated LOS: 5-7 days ?Discharge Concerns: Need to establish a safety plan; Medication  compliance and effectiveness ?Discharge Goals: Return home with outpatient referrals for mental health  follow-up including medication management/psychotherapy ?I certify that inpatient services furnished can reasonably be expected to improve the patient's condition.  ? ?Starleen Blueoris  Lenice Koper, NP ?09/07/2021, 3:50 PM ?

## 2021-09-07 NOTE — Group Note (Signed)
LCSW Group Therapy Note ? ? ?Group Date: 09/07/2021 ?Start Time: 1300 ?End Time: 1400 ? ? ?Type of Therapy and Topic:  Group Therapy: [facility] Mind ? ?Participation Level:  Active ? ?Description of Group: Group members were presented with topic about wise mind, reasonable mind, and emotional mind.  Group members asked to identify situations were they used their wise mind, reasonable mind and emotional mind.  Members asked to identify how behaviors affected them after using parts of their mind and identified alternative behaviors they can use when they are in crisis.  ? ? ?Therapeutic Goals: ? ?1. Patients will identify consequences of using emotional mind and rational mind.  ?2. Patients will engage in discussion on how they cope when they are in crisis ?3.  Patients will discuss coping mechanisms to engage their wise mind ?  ? ? ?Summary of Patient Progress:   ? ?Patient participated appropriately in group. Patient identified that she felt rested today.  Patient participated in discussion and shared that she often lets her emotional brain take over.  Patient shared experiences she has had where she used her emotional and reasonable side of brain.   ? ?Therapeutic Modalities:  ?DBT ?Solution focused therapy ? ? ? ?Jamarl Pew E Smiley Birr, LCSW ?09/07/2021  1:41 PM   ? ?

## 2021-09-07 NOTE — Progress Notes (Addendum)
Patieant stated she slept the most last night than she usually sleeps, 7 hrs.  She likes to ativan because it lowers her heart rate, BP and anxiety.  Usually she does not sleep 3-4 hrs. ? ?

## 2021-09-07 NOTE — [facility] Group Notes (Signed)
Pt did attend NA meeting ?

## 2021-09-07 NOTE — Plan of Care (Signed)
Nurse discussed anxiety, depression and coping skills with patient.  

## 2021-09-08 LAB — COMPREHENSIVE METABOLIC PANEL
ALT: 24 U/L (ref 0–44)
AST: 31 U/L (ref 15–41)
Albumin: 3.7 g/dL (ref 3.5–5.0)
Alkaline Phosphatase: 66 U/L (ref 38–126)
Anion gap: 5 (ref 5–15)
BUN: 10 mg/dL (ref 6–20)
CO2: 28 mmol/L (ref 22–32)
Calcium: 8.8 mg/dL — ABNORMAL LOW (ref 8.9–10.3)
Chloride: 103 mmol/L (ref 98–111)
Creatinine, Ser: 1.03 mg/dL — ABNORMAL HIGH (ref 0.44–1.00)
GFR, Estimated: >60 mL/min (ref >60)
Glucose, Bld: 158 mg/dL — ABNORMAL HIGH (ref 70–99)
Potassium: 4.3 mmol/L (ref 3.5–5.1)
Sodium: 136 mmol/L (ref 135–145)
Total Bilirubin: 0.5 mg/dL (ref 0.3–1.2)
Total Protein: 6.9 g/dL (ref 6.5–8.1)

## 2021-09-08 MED ORDER — LIDOCAINE 5 % EX PTCH
1.0000 | MEDICATED_PATCH | Freq: Every day | CUTANEOUS | Status: DC
  Administered 2021-09-08 – 2021-09-13 (×6): 1 via TRANSDERMAL
  Filled 2021-09-08 (×8): qty 1

## 2021-09-08 MED ORDER — TRAZODONE HCL 100 MG PO TABS
100.0000 mg | ORAL_TABLET | Freq: Every evening | ORAL | Status: DC | PRN
Start: 2021-09-08 — End: 2021-09-13
  Administered 2021-09-08 – 2021-09-12 (×5): 100 mg via ORAL
  Filled 2021-09-08 (×5): qty 1

## 2021-09-08 MED ORDER — PSYLLIUM 95 % PO PACK
1.0000 | PACK | Freq: Every day | ORAL | Status: DC
  Administered 2021-09-08 – 2021-09-11 (×4): 1 via ORAL
  Filled 2021-09-08 (×8): qty 1

## 2021-09-08 NOTE — Progress Notes (Signed)
[facility] Group Notes:  (Nursing/MHT/Case Management/Adjunct)  Date:  09/08/2021  Time:  2015  Type of Therapy:   wrap up group  Participation Level:  Active  Participation Quality:  Appropriate, Attentive, Sharing, and Supportive  Affect:  Appropriate  Cognitive:  Alert  Insight:  Improving  Engagement in Group:  Engaged  Modes of Intervention:  Clarification, Education, and Support  Summary of Progress/Problems: Positive thinking and positive change were discussed.   Jordan Foster 09/08/2021, 9:01 PM

## 2021-09-08 NOTE — Progress Notes (Signed)
   09/08/21 1000  Psych Admission Type (Psych Patients Only)  Admission Status Voluntary  Psychosocial Assessment  Patient Complaints Anxiety  Eye Contact Fair  Facial Expression Anxious  Affect Depressed  Speech Logical/coherent  Interaction Assertive  Appearance/Hygiene Unremarkable  Behavior Characteristics Cooperative;Appropriate to situation  Mood Depressed;Pleasant  Thought Process  Coherency WDL  Content WDL  Delusions None reported or observed  Perception WDL  Hallucination None reported or observed  Judgment Impaired  Confusion WDL  Danger to Self  Agreement Not to Harm Self Yes  Description of Agreement Contracts for safety  Danger to Others  Danger to Others None reported or observed

## 2021-09-08 NOTE — Progress Notes (Signed)
[facility]  09/08/2021 1:04 PM Jordan Foster  MRN:  [id number]  Subjective:  Finnley reports: "I have a lot of pain in my lower back, and my mood is okay."  Reason For Admission: Jordan Foster is a 41 y.o. Caucasian female with a history of PTSD, MDD, GAD & polysubstance abuse who [place] [facility] with complaints of worsening depressive symptoms and +SI. Pt denied having a plan this time, but reports a suicide attempt in 2018 via overdosing on 3 months supply of her medications & cocaine, leading to "flat-lining" for 7 minutes. For this admission, pt reports that she felt the onset of symptoms that she experienced prior to her overdose in 2018, and was transferred to this [place] [facility] for treatment and stabilization of her mood.  Daily Patient assessment Note: For this encounter, pt is seen in her room on the 300 hall. She reports being tired, reports poor sleep quality last night. She states that she woke up at 0200 and had a difficult time going back to sleep.  She presents with a flat affect and depressed mood, her attention to personal hygiene and grooming is fair, eye contact is good, speech is clear & coherent. Thought contents are organized and logical, and pt currently denies SI/HI/AVH or paranoia. There is no evidence of delusional thoughts.    She reports a poor appetite, reports that due to her ulcerative colitis, she has been passing bloody & mucus in her stools. She is currently on Mesalamine prescribed by her outpatient provider. Metamucil added as a bulking agent. Pt reports that  her anxiety is slightly better than it was yesterday with the addition of the Gabapentin. Will consider increasing Gabapentin after rechecking CR which was slightly elevated at 1.37 on admission. Repeat CMP ordered. Pt continues to reports lower back pain which was preexistent prior to this hospitalization.  Lidoderm  patch ordered to be applied to her lower back area for back pain. Trazodone also increased to 100 mg nightly PRN for management of insomnia. Will continue other meds as listed below.  Principal Problem: MDD (major depressive disorder), recurrent episode (HCC) Diagnosis: Principal Problem:   MDD (major depressive disorder), recurrent episode (HCC) Active Problems:   Anxiety   Post-traumatic stress disorder, chronic   Alcohol abuse   Cocaine abuse (HCC)   Ulcerative colitis (HCC)  Total Time spent with patient: 30 minutes  Past Psychiatric History: as above  Past Medical History:  Past Medical History:  Diagnosis Date   Asthma    Depression    Hypertension    Positive TB test     Past Surgical History:  Procedure Laterality Date   LASIK     NASAL SINUS SURGERY     Family History:  Family History  Problem Relation Age of Onset   Hypertension Mother    Family Psychiatric  History: maternal cousin completed suicide, mother with depression. Social History:  Social History   Substance and Sexual Activity  Alcohol Use Yes   Alcohol/week: 35.0 standard drinks   Types: 35 Shots of liquor per week     Social History   Substance and Sexual Activity  Drug Use Yes   Frequency: 3.0 times per week   Types: Marijuana, Cocaine    Social History   Socioeconomic History   Marital status: Single    Spouse name: Not on file   Number of children: 0   Years of education: 6516  Highest education level: Not on file  Occupational History   Occupation: [employer]   Tobacco Use   Smoking status: Never   Smokeless tobacco: Never  Vaping Use   Vaping Use: Never used  Substance and Sexual Activity   Alcohol use: Yes    Alcohol/week: 35.0 standard drinks    Types: 35 Shots of liquor per week   Drug use: Yes    Frequency: 3.0 times per week    Types: Marijuana, Cocaine   Sexual activity: Yes    Comment: W/ Female Partner  Other Topics Concern   Not on file  Social  History Narrative   Fun/Hobby: Working to determine   Social Determinants of Corporate investment banker Strain: Not on file  Food Insecurity: Not on file  Transportation Needs: Not on file  Physical Activity: Not on file  Stress: Not on file  Social Connections: Not on file   Additional Social History:     Sleep: Fair  Appetite:  Poor  Current Medications: Current Facility-Administered Medications  Medication Dose Route Frequency Provider Last Rate Last Admin   acetaminophen (TYLENOL) tablet 650 mg  650 mg Oral Q6H PRN Estella Husk, MD       albuterol (VENTOLIN HFA) 108 (90 Base) MCG/ACT inhaler 2 puff  2 puff Inhalation Q4H PRN Estella Husk, MD       alum & mag hydroxide-simeth (MAALOX/MYLANTA) 200-200-20 MG/5ML suspension 30 mL  30 mL Oral Q4H PRN Estella Husk, MD       ARIPiprazole (ABILIFY) tablet 10 mg  10 mg Oral Daily Starleen Blue, NP   10 mg at 09/08/21 1013   desvenlafaxine (PRISTIQ) 24 hr tablet 50 mg  50 mg Oral Daily Starleen Blue, NP   50 mg at 09/08/21 1012   gabapentin (NEURONTIN) capsule 200 mg  200 mg Oral TID Starleen Blue, NP   200 mg at 09/08/21 1013   hydrOXYzine (ATARAX) tablet 25 mg  25 mg Oral Q6H PRN Massengill, Harrold Donath, MD       lidocaine (LIDODERM) 5 % 1 patch  1 patch Transdermal Daily Alima Naser, NP       loperamide (IMODIUM) capsule 2-4 mg  2-4 mg Oral PRN Massengill, Harrold Donath, MD       loratadine (CLARITIN) tablet 10 mg  10 mg Oral Daily Estella Husk, MD   10 mg at 09/08/21 1014   LORazepam (ATIVAN) tablet 1 mg  1 mg Oral Q6H PRN Massengill, Harrold Donath, MD   1 mg at 09/06/21 1632   LORazepam (ATIVAN) tablet 1 mg  1 mg Oral TID Phineas Inches, MD   1 mg at 09/08/21 1012   Followed by   Melene Muller ON 09/09/2021] LORazepam (ATIVAN) tablet 1 mg  1 mg Oral BID Massengill, Harrold Donath, MD       Followed by   Melene Muller ON 09/10/2021] LORazepam (ATIVAN) tablet 1 mg  1 mg Oral Daily Massengill, Harrold Donath, MD       losartan (COZAAR)  tablet 50 mg  50 mg Oral Daily Chilton Si, Terri L, RPH   50 mg at 09/08/21 1017   magnesium hydroxide (MILK OF MAGNESIA) suspension 30 mL  30 mL Oral Daily PRN Estella Husk, MD       mesalamine (LIALDA) EC tablet 4.8 g  4.8 g Oral Daily Green, Terri L, RPH   4.8 g at 09/08/21 1011   mometasone-formoterol (DULERA) 200-5 MCG/ACT inhaler 2 puff  2 puff Inhalation BID Estella Husk, MD  2 puff at 09/08/21 1017   multivitamin with minerals tablet 1 tablet  1 tablet Oral Daily Massengill, Nathan, MD   1 tablet at 09/08/21 1012   ondansetron (ZOFRAN) tablet 4 mg  4 mg Oral Daily Estella Husk, MD   4 mg at 09/08/21 1018   ondansetron (ZOFRAN-ODT) disintegrating tablet 4 mg  4 mg Oral Q6H PRN Massengill, Harrold Donath, MD       pantoprazole (PROTONIX) EC tablet 40 mg  40 mg Oral Daily Estella Husk, MD   40 mg at 09/08/21 1015   polyvinyl alcohol (LIQUIFILM TEARS) 1.4 % ophthalmic solution 1 drop  1 drop Both Eyes TID PRN Estella Husk, MD   1 drop at 09/07/21 1200   prazosin (MINIPRESS) capsule 2 mg  2 mg Oral QHS Estella Husk, MD   2 mg at 09/07/21 2111   propranolol ER (INDERAL LA) 24 hr capsule 60 mg  60 mg Oral Daily Estella Husk, MD   60 mg at 09/08/21 1015   psyllium (HYDROCIL/METAMUCIL) 1 packet  1 packet Oral Daily Starleen Blue, NP       thiamine tablet 100 mg  100 mg Oral Daily Massengill, Nathan, MD   100 mg at 09/08/21 1015   traZODone (DESYREL) tablet 100 mg  100 mg Oral QHS PRN Starleen Blue, NP        Lab Results: No results found for this or any previous visit (from the past 48 hour(s)).  Blood Alcohol level:  Lab Results  Component Value Date   ETH 73 (H) 09/03/2021    Metabolic Disorder Labs: Lab Results  Component Value Date   HGBA1C 5.2 09/03/2021   MPG 102.54 09/03/2021   No results found for: PROLACTIN Lab Results  Component Value Date   CHOL 230 (H) 09/03/2021   TRIG 77 09/03/2021   HDL 130 09/03/2021   CHOLHDL 1.8  09/03/2021   VLDL 15 09/03/2021   LDLCALC 85 09/03/2021    Physical Findings: AIMS: 0 CIWA:  CIWA-Ar Total: 0 COWS:   n/a  Musculoskeletal: Strength & Muscle Tone: within normal limits Gait & Station: normal Patient leans: N/A  Psychiatric Specialty Exam:  Presentation  General Appearance: Appropriate for Environment; Fairly Groomed  Eye Contact:Good  Speech:Clear and Coherent  Speech Volume:Normal  Handedness:Right  Mood and Affect  Mood:Euthymic  Affect:Appropriate  Thought Process  Thought Processes:Coherent  Descriptions of Associations:Intact  Orientation:Full (Time, Place and Person)  Thought Content:Logical  History of Schizophrenia/Schizoaffective disorder:No  Duration of Psychotic Symptoms:No data recorded Hallucinations:Hallucinations: None  Ideas of Reference:None  Suicidal Thoughts:Suicidal Thoughts: No SI Passive Intent and/or Plan: Without Intent; Without Plan  Homicidal Thoughts:Homicidal Thoughts: No  Sensorium  Memory:Immediate Good  Judgment:Good  Insight:Fair  Executive Functions  Concentration:Fair  Attention Span:Fair  Recall:Good  Fund of Knowledge:Good  Language:Good  Psychomotor Activity  Psychomotor Activity:Psychomotor Activity: Normal  Assets  Assets:Communication Skills; Housing; Social Support  Sleep  Sleep:Sleep: Fair  Physical Exam: Physical Exam Constitutional:      Appearance: Normal appearance.  HENT:     Head: Normocephalic.     Nose: Nose normal. No congestion or rhinorrhea.  Eyes:     Pupils: Pupils are equal, round, and reactive to light.  Pulmonary:     Effort: Pulmonary effort is normal. No respiratory distress.  Musculoskeletal:        General: Normal range of motion.     Cervical back: Normal range of motion. No rigidity.  Neurological:  Mental Status: She is alert and oriented to person, place, and time.     Sensory: No sensory deficit.     Coordination: Coordination normal.   Psychiatric:        Behavior: Behavior normal.   Review of Systems  Constitutional: Negative.  Negative for fever.  HENT: Negative.    Eyes: Negative.   Respiratory: Negative.  Negative for cough.   Cardiovascular: Negative.   Gastrointestinal: Negative.  Negative for heartburn.  Genitourinary: Negative.   Musculoskeletal: Negative.   Skin: Negative.   Neurological: Negative.   Psychiatric/Behavioral:  Positive for depression and substance abuse. Negative for hallucinations, memory loss and suicidal ideas. The patient is nervous/anxious and has insomnia.   Blood pressure (!) 144/92, pulse 85, temperature 98 F (36.7 C), temperature source Oral, resp. rate 16, height 5\' 7"  (1.702 m), weight 68.2 kg, SpO2 98 %. Body mass index is 23.56 kg/m.  Treatment Plan Summary: Daily contact with patient to assess and evaluate symptoms and progress in treatment and Medication management   Observation Level/Precautions:  15 minute checks  Laboratory:  Labs reviewed   Psychotherapy:  Unit Group sessions  Medications:  See [facility]  Consultations:  To be determined   Discharge Concerns:  Safety, medication compliance, mood stability  Estimated LOS: 5-7 days  Other:  N/A    PLAN Safety and Monitoring: Voluntary admission to inpatient psychiatric unit for safety, stabilization and treatment Daily contact with patient to assess and evaluate symptoms and progress in treatment Patient's case to be discussed in multi-disciplinary team meeting Observation Level : q15 minute checks Vital signs: q12 hours Precautions: suicide   Long Term Goal(s): Improvement in symptoms so as ready for discharge   Short Term Goals: Ability to identify changes in lifestyle to reduce recurrence of condition will improve, Ability to disclose and discuss suicidal ideas, Ability to demonstrate self-control will improve, Ability to identify and develop effective coping behaviors will improve, Compliance with prescribed  medications will improve, and Ability to identify triggers associated with substance abuse/mental health issues will improve         Diagnoses Principal Problem:   MDD (major depressive disorder), recurrent episode (HCC) Active Problems:   Anxiety   Post-traumatic stress disorder, chronic   Alcohol abuse   Cocaine abuse (HCC)   Ulcerative colitis (HCC)                            Medications -Continue Hydroxyzine 25 mg every 6 hours PRN -Discontinued Effexor d/t h/o hypertension -Continue Prestiq 50 mg daily for MDD -Continue Gabapentin 200 mg TID for anxiety/ETOH use d/o -Monitor for signs of withdrawal -Continue ETOH Ativan Detox protocol as per MAR - Oral thiamine and MVI replacement as per MAR -Continue Claritin 10 mg daily for seasonal allergies -Continue Losartan 50 mg daily for tachycardia -Continue Inderal 60 mg daily for hypertension - Abstinence from substances encouraged  - SW to look into options for outpatient SA treatment at discharge  -Increase Trazodone to 100 mg nightly PRN for insomnia -Start Lidoderm patch daily for lower back pain -Continue Mesalamine EC 4.8 g daily for  ulcerative colitis -Continue albuterol inhaler PRN for wheezing/SOB   Other PRNS -Continue Tylenol 650 mg every 6 hours PRN for mild pain -Continue Maalox 30 mg every 4 hrs PRN for indigestion -Continue Imodium 2-4 mg as needed for diarrhea -Continue Milk of Magnesia as needed every 6 hrs for constipation -Continue Zofran disintegrating tabs every 6  hrs PRN for nausea    Discharge Planning: Social work and case management to assist with discharge planning and identification of hospital follow-up needs prior to discharge Estimated LOS: 5-7 days Discharge Concerns: Need to establish a safety plan; Medication compliance and effectiveness Discharge Goals: Return home with outpatient referrals for mental health follow-up including medication management/psychotherapy  Starleen Blue,  NP 09/08/2021, 1:04 PM

## 2021-09-08 NOTE — Plan of Care (Signed)
  Problem: Coping: Goal: Ability to verbalize frustrations and anger appropriately will improve Outcome: Progressing Goal: Ability to demonstrate self-control will improve Outcome: Progressing   Problem: Safety: Goal: Periods of time without injury will increase Outcome: Progressing   

## 2021-09-08 NOTE — Progress Notes (Signed)

## 2021-09-08 NOTE — [facility] Suicide Risk Assessment (Signed)
[facility] INPATIENT:  Family/Significant Other Suicide Prevention Education  Suicide Prevention Education:  Education Completed; Jordan Foster [phone] (Girlfriend) has been identified by the patient as the family member/significant other with whom the patient will be residing, and identified as the person(s) who will aid the patient in the event of a mental health crisis (suicidal ideations/suicide attempt).  With written consent from the patient, the family member/significant other has been provided the following suicide prevention education, prior to the and/or following the discharge of the patient.  The suicide prevention education provided includes the following: Suicide risk factors Suicide prevention and interventions National Suicide Hotline telephone number [facility] assessment telephone number [facility] Emergency Assistance 911 [facility] and/or Residential Mobile Crisis Unit telephone number  Request made of family/significant other to: Remove weapons (e.g., guns, rifles, knives), all items previously/currently identified as safety concern.   Remove drugs/medications (over-the-counter, prescriptions, illicit drugs), all items previously/currently identified as a safety concern.  The family member/significant other verbalizes understanding of the suicide prevention education information provided.  The family member/significant other agrees to remove the items of safety concern listed above.  CSW spoke with Jordan Foster who states that her girlfriend had seen a Psychiatrist once and that she had been unable to get back in touch with the doctor to discuss her medications.  She states that her girlfriend was also having difficulties finding a therapist.  She states that her girlfriend have full VA benefits but does not like going to the [place].  Jordan Foster confirms that her girlfriend can come back to the home after discharge and that all weapons and firearms have been  locked up and relocated.  CSW completed SPE with Jordan Foster.   Metro Kung Aditi Rovira 09/08/2021, 2:06 PM

## 2021-09-09 DIAGNOSIS — F4312 Post-traumatic stress disorder, chronic: Secondary | ICD-10-CM

## 2021-09-09 DIAGNOSIS — F419 Anxiety disorder, unspecified: Secondary | ICD-10-CM

## 2021-09-09 DIAGNOSIS — F1010 Alcohol abuse, uncomplicated: Secondary | ICD-10-CM

## 2021-09-09 DIAGNOSIS — F1410 Cocaine abuse, uncomplicated: Secondary | ICD-10-CM

## 2021-09-09 MED ORDER — MELATONIN 5 MG PO TABS
5.0000 mg | ORAL_TABLET | Freq: Every day | ORAL | Status: DC
  Administered 2021-09-09 – 2021-09-12 (×4): 5 mg via ORAL
  Filled 2021-09-09 (×7): qty 1

## 2021-09-09 MED ORDER — GABAPENTIN 300 MG PO CAPS
600.0000 mg | ORAL_CAPSULE | Freq: Every day | ORAL | Status: DC
  Administered 2021-09-09 – 2021-09-12 (×4): 600 mg via ORAL
  Filled 2021-09-09 (×7): qty 2

## 2021-09-09 MED ORDER — OMEGA-3-ACID ETHYL ESTERS 1 G PO CAPS
1.0000 g | ORAL_CAPSULE | Freq: Two times a day (BID) | ORAL | Status: DC
  Administered 2021-09-09 – 2021-09-13 (×8): 1 g via ORAL
  Filled 2021-09-09 (×13): qty 1

## 2021-09-09 MED ORDER — GABAPENTIN 300 MG PO CAPS
300.0000 mg | ORAL_CAPSULE | Freq: Two times a day (BID) | ORAL | Status: DC
  Administered 2021-09-10 – 2021-09-13 (×7): 300 mg via ORAL
  Filled 2021-09-09 (×11): qty 1

## 2021-09-09 NOTE — Progress Notes (Signed)
   09/09/21 2053  Psych Admission Type (Psych Patients Only)  Admission Status Voluntary  Psychosocial Assessment  Patient Complaints Anxiety  Eye Contact Fair  Facial Expression Flat  Affect Anxious  Speech Logical/coherent  Interaction Assertive  Motor Activity Other (Comment) (WDL)  Appearance/Hygiene Unremarkable  Behavior Characteristics Appropriate to situation  Mood Anxious;Pleasant  Thought Process  Coherency WDL  Content WDL  Delusions None reported or observed  Perception WDL  Hallucination None reported or observed  Judgment Impaired  Confusion None  Danger to Self  Current suicidal ideation? Denies  Self-Injurious Behavior No self-injurious ideation or behavior indicators observed or expressed   Agreement Not to Harm Self Yes  Description of Agreement verbal  Danger to Others  Danger to Others None reported or observed

## 2021-09-09 NOTE — Plan of Care (Signed)
  Problem: Coping: Goal: Ability to verbalize frustrations and anger appropriately will improve Outcome: Progressing Goal: Ability to demonstrate self-control will improve Outcome: Progressing   Problem: Safety: Goal: Periods of time without injury will increase Outcome: Progressing   Problem: Medication: Goal: Compliance with prescribed medication regimen will improve Outcome: Progressing   

## 2021-09-09 NOTE — Group Note (Signed)
Recreation Therapy Group Note   Group Topic:Stress Management  Group Date: 09/09/2021 Start Time: 0935 End Time: 0950 Facilitators: Caroll Rancher, [place] Location: 300 Hall Dayroom   Goal Area(s) Addresses:  Patient will identify positive stress management techniques. Patient will identify benefits of using stress management post d/c.  Group Description:  Meditation.  LRT played a meditation that focused on being resilient in the face of resistance and let down.  Patients were to listen and follow along to fully engage in the meditation as it played.  Patents were encouraged to look for sources to use for stress management such as Apps, Youtube, scripts, etc.    Affect/Mood: Appropriate   Participation Level: Engaged   Participation Quality: Independent   Behavior: Appropriate   Speech/Thought Process: Focused   Insight: Good   Judgement: Good   Modes of Intervention: Meditation   Patient Response to Interventions:  Engaged   Education Outcome:  Acknowledges education and In group clarification offered    Clinical Observations/Individualized Feedback: Pt attended and participated in group session.     Plan: Continue to engage patient in RT group sessions 2-3x/week.   Caroll Rancher, LRT,CTRS 09/09/2021 11:12 AM

## 2021-09-09 NOTE — Progress Notes (Signed)
   09/08/21 2300  Psych Admission Type (Psych Patients Only)  Admission Status Voluntary  Psychosocial Assessment  Patient Complaints Anxiety  Eye Contact Fair  Facial Expression Flat  Affect Anxious  Speech Logical/coherent  Interaction Assertive  Motor Activity Slow  Appearance/Hygiene Improved  Behavior Characteristics Cooperative;Appropriate to situation  Mood Pleasant  Thought Process  Coherency WDL  Content WDL  Delusions None reported or observed  Perception WDL  Hallucination None reported or observed  Judgment Impaired  Confusion None  Danger to Self  Current suicidal ideation? Denies  Self-Injurious Behavior No self-injurious ideation or behavior indicators observed or expressed   Agreement Not to Harm Self Yes  Description of Agreement verbal

## 2021-09-09 NOTE — Group Note (Signed)
[facility] LCSW Group Therapy   09/09/2021 1:50 PM    Type of Therapy and Topic:  Group Therapy:  Strengths Exploration   Participation Level: Active  Description of Group: This group allows individuals to explore their strengths, learn to use strengths in new ways to improve well-being. Strengths-based interventions involve identifying strengths, understanding how they are used, and learning new ways to apply them. Individuals will identify their strengths, and then explore their roles in different areas of life (relationships, professional life, and personal fulfillment). Individuals will think about ways in which they currently use their strengths, along with new ways they could begin using them.    Therapeutic Goals Patient will verbalize two of their strengths Patient will identify how their strengths are currently used Patient will identify two new ways to apply their strengths  Patients will create a plan to apply their strengths in their daily lives     Summary of Patient Progress:  Patient participated appropriately in group and identified strengths in a fictional character, someone inspiring and within herself.  She shared with the group that she often receives compliments that she is kind and giving.  Patient was open to feedback from peers.        Therapeutic Modalities Cognitive Behavioral Therapy Motivational Interviewing   Jocelyn Lowery, LCSW, LCAS Clincal Social Worker  [facility]
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
## 2021-09-09 NOTE — Progress Notes (Signed)
[facility] MD Progress Note  09/09/2021 3:50 PM Jordan CATELEYA Foster  MRN:  [id number]  Subjective:  Jordan Foster reports: "I have a lot of pain in my lower back, and my mood is okay."  Reason For Admission: Jordan Foster is a 41 y.o. Caucasian female with a history of PTSD, MDD, GAD & polysubstance abuse who presented to the [employer] [facility] health urgent care [facility]) with complaints of worsening depressive symptoms and +SI. Pt denied having a plan this time, but reports a suicide attempt in 2018 via overdosing on 3 months supply of her medications & cocaine, leading to "flat-lining" for 7 minutes. For this admission, pt reports that she felt the onset of symptoms that she experienced prior to her overdose in 2018, and was transferred to this [place] [facility] for treatment and stabilization of her mood.  Daily Patient assessment Note: For this encounter, pt is seen in her room on the 300 hall.  [name] was seen this afternoon. She feels that she has been "hanging in there". She is getting along with peers and attending groups. She is still depressed, but trying to distract herself. She plans to see her SO today during visitation. She is sleeping better with trazodone, but still wakes up at 0200. She is not having hallucinations. She still has suicidal ideation but no plan, states "what's the point", she just has not come up with something else to do with her life and her future. She is eating "kinda". She is having back pain. She continues to struggle with recall due to her history of TBI. She has never tried anything like Omega 3 and hasn't really looked into it. She has very poor future orientation, both short and long term.   Principal Problem: MDD (major depressive disorder), recurrent episode ([place]) Diagnosis: Principal Problem:   MDD (major depressive disorder), recurrent episode ([place] ) Active Problems:   Anxiety   Post-traumatic stress disorder, chronic   Alcohol abuse    Cocaine abuse ([place])   Ulcerative colitis ([place])  Total Time spent with patient: 30 minutes  Past Psychiatric History: as above  Past Medical History:  Past Medical History:  Diagnosis Date  . Asthma   . Depression   . Hypertension   . Positive TB test     Past Surgical History:  Procedure Laterality Date  . LASIK    . NASAL SINUS SURGERY     Family History:  Family History  Problem Relation Age of Onset  . Hypertension Mother    Family Psychiatric  History: maternal cousin completed suicide, mother with depression. Social History:  Social History   Substance and Sexual Activity  Alcohol Use Yes  . Alcohol/week: 35.0 standard drinks  . Types: 35 Shots of liquor per week     Social History   Substance and Sexual Activity  Drug Use Yes  . Frequency: 3.0 times per week  . Types: Marijuana, Cocaine    Social History   Socioeconomic History  . Marital status: Single    Spouse name: Not on file  . Number of children: 0  . Years of education: 26  . Highest education level: Not on file  Occupational History  . Occupation: [employer]   Tobacco Use  . Smoking status: Never  . Smokeless tobacco: Never  Vaping Use  . Vaping Use: Never used  Substance and Sexual Activity  . Alcohol use: Yes    Alcohol/week: 35.0 standard drinks    Types: 35 Shots of liquor per  week  . Drug use: Yes    Frequency: 3.0 times per week    Types: Marijuana, Cocaine  . Sexual activity: Yes    Comment: W/ Female Partner  Other Topics Concern  . Not on file  Social History Narrative   Fun/Hobby: Working to determine   Social Determinants of Radio broadcast assistant Strain: Not on file  Food Insecurity: Not on file  Transportation Needs: Not on file  Physical Activity: Not on file  Stress: Not on file  Social Connections: Not on file   Additional Social History:     Sleep: Fair  Appetite:  Poor  Current Medications: Current Facility-Administered Medications   Medication Dose Route Frequency Provider Last Rate Last Admin  . acetaminophen (TYLENOL) tablet 650 mg  650 mg Oral Q6H PRN Ival Bible, MD      . albuterol (VENTOLIN HFA) 108 (90 Base) MCG/ACT inhaler 2 puff  2 puff Inhalation Q4H PRN Ival Bible, MD      . alum & mag hydroxide-simeth (MAALOX/MYLANTA) 200-200-20 MG/5ML suspension 30 mL  30 mL Oral Q4H PRN Ival Bible, MD      . ARIPiprazole (ABILIFY) tablet 10 mg  10 mg Oral Daily Nicholes Rough, NP   10 mg at 09/09/21 0801  . desvenlafaxine (PRISTIQ) 24 hr tablet 50 mg  50 mg Oral Daily Nkwenti, Doris, NP   50 mg at 09/09/21 0800  . gabapentin (NEURONTIN) capsule 200 mg  200 mg Oral TID Nicholes Rough, NP   200 mg at 09/09/21 1435  . hydrOXYzine (ATARAX) tablet 25 mg  25 mg Oral Q6H PRN Massengill, Nathan, MD      . lidocaine (LIDODERM) 5 % 1 patch  1 patch Transdermal Daily Nicholes Rough, NP   1 patch at 09/09/21 0802  . loperamide (IMODIUM) capsule 2-4 mg  2-4 mg Oral PRN Massengill, Nathan, MD      . loratadine (CLARITIN) tablet 10 mg  10 mg Oral Daily Ival Bible, MD   10 mg at 09/09/21 0800  . LORazepam (ATIVAN) tablet 1 mg  1 mg Oral Q6H PRN Janine Limbo, MD   1 mg at 09/06/21 1632  . LORazepam (ATIVAN) tablet 1 mg  1 mg Oral BID Massengill, Nathan, MD   1 mg at 09/09/21 0800   Followed by  . [START ON 09/10/2021] LORazepam (ATIVAN) tablet 1 mg  1 mg Oral Daily Massengill, Nathan, MD      . losartan (COZAAR) tablet 50 mg  50 mg Oral Daily Green, Terri L, RPH   50 mg at 09/09/21 0800  . magnesium hydroxide (MILK OF MAGNESIA) suspension 30 mL  30 mL Oral Daily PRN Ival Bible, MD      . mesalamine (LIALDA) EC tablet 4.8 g  4.8 g Oral Daily Green, Terri L, RPH   4.8 g at 09/09/21 0800  . mometasone-formoterol (DULERA) 200-5 MCG/ACT inhaler 2 puff  2 puff Inhalation BID Ival Bible, MD   2 puff at 09/09/21 0802  . multivitamin with minerals tablet 1 tablet  1 tablet Oral Daily  Massengill, Nathan, MD   1 tablet at 09/09/21 0800  . ondansetron (ZOFRAN) tablet 4 mg  4 mg Oral Daily Ival Bible, MD   4 mg at 09/09/21 0805  . ondansetron (ZOFRAN-ODT) disintegrating tablet 4 mg  4 mg Oral Q6H PRN Massengill, Nathan, MD      . pantoprazole (PROTONIX) EC tablet 40 mg  40 mg Oral Daily  Ival Bible, MD   40 mg at 09/09/21 0800  . polyvinyl alcohol (LIQUIFILM TEARS) 1.4 % ophthalmic solution 1 drop  1 drop Both Eyes TID PRN Ival Bible, MD   1 drop at 09/07/21 1200  . prazosin (MINIPRESS) capsule 2 mg  2 mg Oral QHS Ival Bible, MD   2 mg at 09/08/21 2102  . propranolol ER (INDERAL LA) 24 hr capsule 60 mg  60 mg Oral Daily Ival Bible, MD   60 mg at 09/09/21 0800  . psyllium (HYDROCIL/METAMUCIL) 1 packet  1 packet Oral Daily Nicholes Rough, NP   1 packet at 09/09/21 0801  . thiamine tablet 100 mg  100 mg Oral Daily Massengill, Nathan, MD   100 mg at 09/09/21 0801  . traZODone (DESYREL) tablet 100 mg  100 mg Oral QHS PRN Nicholes Rough, NP   100 mg at 09/08/21 2105    Lab Results:  Results for orders placed or performed during the hospital encounter of 09/06/21 (from the past 48 hour(s))  Comprehensive metabolic panel     Status: Abnormal   Collection Time: 09/08/21  6:24 PM  Result Value Ref Range   Sodium 136 135 - 145 mmol/L   Potassium 4.3 3.5 - 5.1 mmol/L   Chloride 103 98 - 111 mmol/L   CO2 28 22 - 32 mmol/L   Glucose, Bld 158 (H) 70 - 99 mg/dL    Comment: Glucose reference range applies only to samples taken after fasting for at least 8 hours.   BUN 10 6 - 20 mg/dL   Creatinine, Ser 1.03 (H) 0.44 - 1.00 mg/dL   Calcium 8.8 (L) 8.9 - 10.3 mg/dL   Total Protein 6.9 6.5 - 8.1 g/dL   Albumin 3.7 3.5 - 5.0 g/dL   AST 31 15 - 41 U/L   ALT 24 0 - 44 U/L   Alkaline Phosphatase 66 38 - 126 U/L   Total Bilirubin 0.5 0.3 - 1.2 mg/dL   GFR, Estimated >60 >60 mL/min    Comment: (NOTE) Calculated using the CKD-EPI Creatinine  Equation (2021)    Anion gap 5 5 - 15    Comment: Performed at [facility], [place] [address], [place], [place] [address]    Blood Alcohol level:  Lab Results  Component Value Date   ETH 73 (H) 123456    Metabolic Disorder Labs: Lab Results  Component Value Date   HGBA1C 5.2 09/03/2021   MPG 102.54 09/03/2021   No results found for: PROLACTIN Lab Results  Component Value Date   CHOL 230 (H) 09/03/2021   TRIG 77 09/03/2021   HDL 130 09/03/2021   CHOLHDL 1.8 09/03/2021   VLDL 15 09/03/2021   LDLCALC 85 09/03/2021    Physical Findings: AIMS: 0 CIWA:  CIWA-Ar Total: 1 COWS:   n/a  Musculoskeletal: Strength & Muscle Tone: within normal limits Gait & Station: normal Patient leans: N/A  Psychiatric Specialty Exam:  Presentation  General Appearance: Appropriate for Environment  Eye Contact:Good  Speech:Normal Rate  Speech Volume:Normal  Handedness:Right  Mood and Affect  Mood:Depressed  Affect:Depressed  Thought Process  Thought Processes:Linear  Descriptions of Associations:Intact  Orientation:Full (Time, Place and Person)  Thought Content:Logical  History of Schizophrenia/Schizoaffective disorder:No  Duration of Psychotic Symptoms:No data recorded Hallucinations:Hallucinations: None  Ideas of Reference:None  Suicidal Thoughts:Suicidal Thoughts: Yes, Passive SI Passive Intent and/or Plan: Without Intent  Homicidal Thoughts:Homicidal Thoughts: No  Sensorium  Memory:Immediate Fair; Remote Poor  Judgment:Poor  Insight:Fair  Executive Functions  Concentration:Fair  Attention Span:Fair  [place]  Language:Good  Psychomotor Activity  Psychomotor Activity:Psychomotor Activity: Normal  Assets  Assets:Communication Skills; Leisure Time  Sleep  Sleep:Sleep: Poor  Physical Exam: Physical Exam Constitutional:      Appearance: Normal appearance.  HENT:     Head: Normocephalic.   Eyes:     Pupils: Pupils are equal, round, and reactive to light.  Pulmonary:     Effort: Pulmonary effort is normal.  Musculoskeletal:        General: Normal range of motion.     Cervical back: Normal range of motion.  Neurological:     Mental Status: She is alert and oriented to person, place, and time.  Psychiatric:        Behavior: Behavior normal.   Review of Systems  Constitutional: Negative.  Negative for fever.  HENT: Negative.    Eyes: Negative.   Respiratory: Negative.  Negative for cough.   Cardiovascular: Negative.   Gastrointestinal: Negative.  Negative for heartburn.  Genitourinary: Negative.   Musculoskeletal:  Positive for back pain.  Skin: Negative.   Neurological: Negative.   Psychiatric/Behavioral:  Positive for depression and substance abuse. Negative for hallucinations, memory loss and suicidal ideas. The patient is nervous/anxious and has insomnia.   Blood pressure 99/73, pulse (!) 117, temperature 98 F (36.7 C), temperature source Oral, resp. rate 14, height 5\' 7"  (1.702 m), weight 68.2 kg, SpO2 100 %. Body mass index is 23.56 kg/m.  Treatment Plan Summary: Daily contact with patient to assess and evaluate symptoms and progress in treatment and Medication management   Observation Level/Precautions:  15 minute checks  Laboratory:  Labs reviewed   Psychotherapy:  Unit Group sessions  Medications:  See [facility]  Consultations:  To be determined   Discharge Concerns:  Safety, medication compliance, mood stability  Estimated LOS: 5-7 days  Other:  N/A    PLAN Safety and Monitoring: Voluntary admission to inpatient psychiatric unit for safety, stabilization and treatment Daily contact with patient to assess and evaluate symptoms and progress in treatment Patient's case to be discussed in multi-disciplinary team meeting Observation Level : q15 minute checks Vital signs: q12 hours Precautions: suicide   Long Term Goal(s): Improvement in symptoms so as  ready for discharge   Short Term Goals: Ability to identify changes in lifestyle to reduce recurrence of condition will improve, Ability to disclose and discuss suicidal ideas, Ability to demonstrate self-control will improve, Ability to identify and develop effective coping behaviors will improve, Compliance with prescribed medications will improve, and Ability to identify triggers associated with substance abuse/mental health issues will improve         Diagnoses Principal Problem:   MDD (major depressive disorder), recurrent episode ([place]) Active Problems:   Anxiety   Post-traumatic stress disorder, chronic   Alcohol abuse   Cocaine abuse ([facility])   Ulcerative colitis ([facility])                            Medications -Continue Hydroxyzine 25 mg every 6 hours PRN -Discontinued Effexor d/t h/o hypertension -Continue Pristiq 50 mg daily for MDD -Increase Gabapentin 300mg  PO with breakfast and lunch and 600mg  PO QHS for anxiety/ETOH use d/o -Monitor for signs of withdrawal -Continue ETOH Ativan Detox protocol as per MAR - Oral thiamine and MVI replacement as per MAR -Continue Claritin 10 mg daily for seasonal allergies -Continue Losartan 50 mg daily for  tachycardia -Continue Inderal 60 mg daily for hypertension - Abstinence from substances encouraged  - SW to look into options for outpatient SA treatment at discharge  -Trazodone to 100 mg nightly PRN for insomnia -Lidoderm patch daily for lower back pain -Continue Mesalamine EC 4.8 g daily for  ulcerative colitis -Continue albuterol inhaler PRN for wheezing/SOB   Other PRNS -Continue Tylenol 650 mg every 6 hours PRN for mild pain -Continue Maalox 30 mg every 4 hrs PRN for indigestion -Continue Imodium 2-4 mg as needed for diarrhea -Continue Milk of Magnesia as needed every 6 hrs for constipation -Continue Zofran disintegrating tabs every 6 hrs PRN for nausea    Discharge Planning: Social work and case management to assist with discharge  planning and identification of hospital follow-up needs prior to discharge Estimated LOS: 5-7 days Discharge Concerns: Need to establish a safety plan; Medication compliance and effectiveness Discharge Goals: Return home with outpatient referrals for mental health follow-up including medication management/psychotherapy  Maida Sale, MD 09/09/2021, 3:50 PM

## 2021-09-09 NOTE — Progress Notes (Signed)
   09/09/21 0800  Psych Admission Type (Psych Patients Only)  Admission Status Voluntary  Psychosocial Assessment  Patient Complaints Anxiety  Eye Contact Fair  Facial Expression Flat  Affect Anxious  Speech Logical/coherent  Interaction Assertive  Motor Activity Slow  Appearance/Hygiene Improved  Behavior Characteristics Cooperative;Appropriate to situation  Mood Pleasant  Thought Process  Coherency WDL  Content WDL  Delusions None reported or observed  Perception WDL  Hallucination None reported or observed  Judgment Impaired  Confusion None  Danger to Self  Current suicidal ideation? Denies  Self-Injurious Behavior No self-injurious ideation or behavior indicators observed or expressed   Agreement Not to Harm Self Yes  Description of Agreement Verbally contracts for safety  Danger to Others  Danger to Others None reported or observed

## 2021-09-09 NOTE — [facility] Group Notes (Signed)
PT attended AA and was appropriate and attentive. 

## 2021-09-10 MED ORDER — HYDROXYZINE HCL 25 MG PO TABS
25.0000 mg | ORAL_TABLET | Freq: Three times a day (TID) | ORAL | Status: DC | PRN
  Administered 2021-09-10 – 2021-09-13 (×6): 25 mg via ORAL
  Filled 2021-09-10 (×6): qty 1

## 2021-09-10 NOTE — [facility] Group Notes (Signed)
.  Psychoeducational Group Note    Date:  5/20//23 Time: 1300-1400    Purpose of Group: . The group focus' on teaching patients on how to identify their needs and their Life Skills:  A group where two lists are made. What people need and what are things that we do that are unhealthy. The lists are developed by the patients and it is explained that we often do the actions that are not healthy to get our list of needs met.  Goal:: to develop the coping skills needed to get their needs met  Participation Level:  Active  Participation Quality:  Appropriate  Affect:  Appropriate  Cognitive:  Oriented  Insight:  Improving  Engagement in Group:  Engage  Additional Comments: Participated fully in the group. Rates energy at a 6/10  Bryson Dames A

## 2021-09-10 NOTE — [facility] Group Notes (Signed)
Psychoeducational Group Note  Date: 09/10/2021 Time: 0900-1000    Goal Setting   Purpose of Group: This group helps to provide patients with the steps of setting a goal that is specific, measurable, attainable, realistic and time specific. A discussion on how we keep ourselves stuck with negative self talk. Homework given for Patients to write 30 positive attributes about themselves.    Participation Level:  Active  Participation Quality:  Appropriate  Affect:  Appropriate  Cognitive:  Appropriate  Insight:  Improving  Engagement in Group:  Engaged  Additional Comments: Rates her energy at a 5/10. Participated fully in the group.  Dione Housekeeper

## 2021-09-10 NOTE — Progress Notes (Signed)
Adult Psychoeducational Group Note  Date:  09/10/2021 Time:  9:05 AM  Group Topic/Focus:  Goals Group:   The focus of this group is to help patients establish daily goals to achieve during treatment and discuss how the patient can incorporate goal setting into their daily lives to aide in recovery.  Participation Level:  Active  Participation Quality:  Appropriate  Affect:  Appropriate  Cognitive:  Appropriate  Insight: Appropriate  Engagement in Group:  Engaged  Modes of Intervention:  Discussion  Additional Comments:  Patient attended morning orientation group and said that her goal for today is to self-reflection.   [name] W [name] 0000000, 9:05 AM

## 2021-09-10 NOTE — Group Note (Signed)
LCSW Group Therapy Note 09/10/2021    Type of Therapy and Topic:  Group Therapy: Anger and Commonalities  Participation Level:  Active   Description of Group: In this group, patients initially shared an "unknown" fact about themselves and CSW led a discussion about the ways in which we have things in common without realizing it.  Patient then identified a recent time they became angry and how this yet again showed a way in which they had something in common with other patients.  We discussed possible unhealthy reactions to anger and possible healthy reactions.  We also discussed possible underlying emotions that lead to the anger.  Commonalities among group members were pointed out throughout the entirety of group.  Therapeutic Goals: Patients were asked to share something about themselves and learned that they often have things in common with other people without knowing this Patients will remember an incident of anger and how they reacted Patients will be able to identify their reaction as healthy or unhealthy, and identify possible reactions that would have been the opposite Patients will learn that anger itself is a secondary emotion and will think about their primary emotion at the time of their last incident of anger  Summary of Patient Progress:  The patient shared that something interesting she could share about herself is that she has 3 cats and 2 ddogs.  This was expressed as also present in several other patients, so the patient was able to recognize that she is not alone.  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  Her conclusion by the end of group was that this chosen method of coping is unhealthy, at least in the way she does it.  She showed insight.  Therapeutic Modalities:   Cognitive Behavioral Therapy  Lynnell Chad,  LCSW 09/10/2021  1:25 PM

## 2021-09-10 NOTE — Progress Notes (Signed)
   09/10/21 2212  Psych Admission Type (Psych Patients Only)  Admission Status Voluntary  Psychosocial Assessment  Patient Complaints Anxiety  Eye Contact Fair  Facial Expression Flat  Affect Appropriate to circumstance  Speech Logical/coherent  Interaction Assertive  Motor Activity Other (Comment) (WDL)  Appearance/Hygiene Unremarkable  Behavior Characteristics Appropriate to situation  Mood Anxious  Thought Process  Coherency WDL  Content WDL  Delusions None reported or observed  Perception WDL  Hallucination None reported or observed  Judgment Impaired  Confusion None  Danger to Self  Current suicidal ideation? Denies  Self-Injurious Behavior No self-injurious ideation or behavior indicators observed or expressed   Agreement Not to Harm Self Yes  Description of Agreement verbal  Danger to Others  Danger to Others None reported or observed

## 2021-09-10 NOTE — Progress Notes (Signed)
[facility] MD Progress Note  09/10/2021 2:06 PM Jordan Foster  MRN:  [id number]  Subjective:  Jordan Foster reports: "I have pain in my lower back, and my mood is okay."  Brief History: Jordan Foster is a 41 y.o. Caucasian female with a history of PTSD, MDD, GAD & polysubstance abuse who presented to the [facility] urgent care [facility]) with complaints of worsening depressive symptoms and +SI. Pt denied having a plan this time, but reports a suicide attempt in 2018 via overdosing on 3 months supply of her medications & cocaine, leading to "flat-lining" for 7 minutes. For this admission, pt reports that she felt the onset of symptoms that she experienced prior to her overdose in 2018, and was transferred to this [place] for treatment and stabilization of her mood.  Daily Patient assessment Note: For this encounter, pt is seen in her room on the 300 hall.  Jordan Foster was seen this morning. She feels that she has been "hanging in there". Chart reviewed and findings shared with the Tx Team and discussed with Dr. Loleta Chance. She is getting along with peers and actively participating in therapeutic milieu and group therapy. Rated depression as "4" and anxiety as "6" on a scale of 0 to 10. Sleeping better with trazodone, and slept for 7 hours last night. Denied SI, HI, AVH. She is having less back pain and continues with gabapentin as ordered. She continues to struggle with recall due to her history of TBI. She has never tried anything like Omega 3 and hasn't really looked into it. She has very poor future orientation, both short and long term.   Principal Problem: MDD (major depressive disorder), recurrent episode (HCC) Diagnosis: Principal Problem:   MDD (major depressive disorder), recurrent episode (HCC) Active Problems:   Anxiety   Post-traumatic stress disorder, chronic   Alcohol abuse   Cocaine abuse (HCC)   Ulcerative colitis (HCC)  Total Time spent with  patient: 30 minutes  Past Psychiatric History: as above  Past Medical History:  Past Medical History:  Diagnosis Date   Asthma    Depression    Hypertension    Positive TB test     Past Surgical History:  Procedure Laterality Date   LASIK     NASAL SINUS SURGERY     Family History:  Family History  Problem Relation Age of Onset   Hypertension Mother    Family Psychiatric  History: maternal cousin completed suicide, mother with depression. Social History:  Social History   Substance and Sexual Activity  Alcohol Use Yes   Alcohol/week: 35.0 standard drinks   Types: 35 Shots of liquor per week     Social History   Substance and Sexual Activity  Drug Use Yes   Frequency: 3.0 times per week   Types: Marijuana, Cocaine    Social History   Socioeconomic History   Marital status: Single    Spouse name: Not on file   Number of children: 0   Years of education: 16   Highest education level: Not on file  Occupational History   Occupation: [employer]   Tobacco Use   Smoking status: Never   Smokeless tobacco: Never  Vaping Use   Vaping Use: Never used  Substance and Sexual Activity   Alcohol use: Yes    Alcohol/week: 35.0 standard drinks    Types: 35 Shots of liquor per week   Drug use: Yes    Frequency: 3.0 times per week  Types: Marijuana, Cocaine   Sexual activity: Yes    Comment: W/ Female Partner  Other Topics Concern   Not on file  Social History Narrative   Fun/Hobby: Working to determine   Social Determinants of Corporate investment banker Strain: Not on file  Food Insecurity: Not on file  Transportation Needs: Not on file  Physical Activity: Not on file  Stress: Not on file  Social Connections: Not on file   Additional Social History:     Sleep: Fair  Appetite:  Poor  Current Medications: Current Facility-Administered Medications  Medication Dose Route Frequency Provider Last Rate Last Admin   acetaminophen (TYLENOL) tablet 650  mg  650 mg Oral Q6H PRN Estella Husk, MD       albuterol (VENTOLIN HFA) 108 (90 Base) MCG/ACT inhaler 2 puff  2 puff Inhalation Q4H PRN Estella Husk, MD       alum & mag hydroxide-simeth (MAALOX/MYLANTA) 200-200-20 MG/5ML suspension 30 mL  30 mL Oral Q4H PRN Estella Husk, MD       ARIPiprazole (ABILIFY) tablet 10 mg  10 mg Oral Daily Nkwenti, Doris, NP   10 mg at 09/10/21 0816   desvenlafaxine (PRISTIQ) 24 hr tablet 50 mg  50 mg Oral Daily Nkwenti, Doris, NP   50 mg at 09/10/21 0815   gabapentin (NEURONTIN) capsule 300 mg  300 mg Oral BID WC Hill, Shelbie Hutching, MD   300 mg at 09/10/21 1214   gabapentin (NEURONTIN) capsule 600 mg  600 mg Oral QHS Hill, Shelbie Hutching, MD   600 mg at 09/09/21 2122   lidocaine (LIDODERM) 5 % 1 patch  1 patch Transdermal Daily Starleen Blue, NP   1 patch at 09/10/21 0817   loratadine (CLARITIN) tablet 10 mg  10 mg Oral Daily Estella Husk, MD   10 mg at 09/10/21 0815   losartan (COZAAR) tablet 50 mg  50 mg Oral Daily Otho Bellows, RPH   50 mg at 09/10/21 0816   magnesium hydroxide (MILK OF MAGNESIA) suspension 30 mL  30 mL Oral Daily PRN Estella Husk, MD       melatonin tablet 5 mg  5 mg Oral QHS Hill, Shelbie Hutching, MD   5 mg at 09/09/21 2123   mesalamine (LIALDA) EC tablet 4.8 g  4.8 g Oral Daily Otho Bellows, RPH   4.8 g at 09/10/21 7622   mometasone-formoterol (DULERA) 200-5 MCG/ACT inhaler 2 puff  2 puff Inhalation BID Estella Husk, MD   2 puff at 09/10/21 6333   multivitamin with minerals tablet 1 tablet  1 tablet Oral Daily Massengill, Harrold Donath, MD   1 tablet at 09/10/21 0816   omega-3 acid ethyl esters (LOVAZA) capsule 1 g  1 g Oral BID Roselle Locus, MD   1 g at 09/10/21 0815   ondansetron (ZOFRAN) tablet 4 mg  4 mg Oral Daily Estella Husk, MD   4 mg at 09/10/21 0816   pantoprazole (PROTONIX) EC tablet 40 mg  40 mg Oral Daily Estella Husk, MD   40 mg at 09/10/21 0816   polyvinyl  alcohol (LIQUIFILM TEARS) 1.4 % ophthalmic solution 1 drop  1 drop Both Eyes TID PRN Estella Husk, MD   1 drop at 09/07/21 1200   prazosin (MINIPRESS) capsule 2 mg  2 mg Oral QHS Estella Husk, MD   2 mg at 09/09/21 2122   propranolol ER (INDERAL LA) 24 hr capsule 60 mg  60 mg Oral Daily Estella Husk, MD   60 mg at 09/10/21 0816   psyllium (HYDROCIL/METAMUCIL) 1 packet  1 packet Oral Daily Starleen Blue, NP   1 packet at 09/10/21 0813   thiamine tablet 100 mg  100 mg Oral Daily Massengill, Harrold Donath, MD   100 mg at 09/10/21 0816   traZODone (DESYREL) tablet 100 mg  100 mg Oral QHS PRN Starleen Blue, NP   100 mg at 09/09/21 2125    Lab Results:  Results for orders placed or performed during the hospital encounter of 09/06/21 (from the past 48 hour(s))  Comprehensive metabolic panel     Status: Abnormal   Collection Time: 09/08/21  6:24 PM  Result Value Ref Range   Sodium 136 135 - 145 mmol/L   Potassium 4.3 3.5 - 5.1 mmol/L   Chloride 103 98 - 111 mmol/L   CO2 28 22 - 32 mmol/L   Glucose, Bld 158 (H) 70 - 99 mg/dL    Comment: Glucose reference range applies only to samples taken after fasting for at least 8 hours.   BUN 10 6 - 20 mg/dL   Creatinine, Ser 1.61 (H) 0.44 - 1.00 mg/dL   Calcium 8.8 (L) 8.9 - 10.3 mg/dL   Total Protein 6.9 6.5 - 8.1 g/dL   Albumin 3.7 3.5 - 5.0 g/dL   AST 31 15 - 41 U/L   ALT 24 0 - 44 U/L   Alkaline Phosphatase 66 38 - 126 U/L   Total Bilirubin 0.5 0.3 - 1.2 mg/dL   GFR, Estimated >09 >60 mL/min    Comment: (NOTE) Calculated using the CKD-EPI Creatinine Equation (2021)    Anion gap 5 5 - 15    Comment: Performed at [facility], [address], [place], [place] [address]    Blood Alcohol level:  Lab Results  Component Value Date   ETH 73 (H) 09/03/2021    Metabolic Disorder Labs: Lab Results  Component Value Date   HGBA1C 5.2 09/03/2021   MPG 102.54 09/03/2021   No results found for: PROLACTIN Lab  Results  Component Value Date   CHOL 230 (H) 09/03/2021   TRIG 77 09/03/2021   HDL 130 09/03/2021   CHOLHDL 1.8 09/03/2021   VLDL 15 09/03/2021   LDLCALC 85 09/03/2021    Physical Findings: AIMS: 0 CIWA:  CIWA-Ar Total: 4 COWS:   n/a  Musculoskeletal: Strength & Muscle Tone: within normal limits Gait & Station: normal Patient leans: N/A  Psychiatric Specialty Exam:  Presentation  General Appearance: Appropriate for Environment; Casual; Fairly Groomed  Eye Contact:Good  Speech:Clear and Coherent; Normal Rate  Speech Volume:Normal  Handedness:Right  Mood and Affect  Mood:Euthymic; Depressed; Anxious  Affect:Appropriate; Congruent (Wears a smile)  Thought Process  Thought Processes:Coherent; Linear  Descriptions of Associations:Intact  Orientation:Full (Time, Place and Person)  Thought Content:Logical; WDL  History of Schizophrenia/Schizoaffective disorder:No  Duration of Psychotic Symptoms:No data recorded Hallucinations:Hallucinations: None Description of Command Hallucinations: n/a  Ideas of Reference:None  Suicidal Thoughts:Suicidal Thoughts: No SI Passive Intent and/or Plan: -- (n/a)  Homicidal Thoughts:Homicidal Thoughts: No  Sensorium  Memory:Immediate Fair; Recent Fair; Remote Fair  Judgment:Fair  Insight:Fair  Executive Functions  Concentration:Fair  Attention Span:Good  Recall:Good  Fund of Knowledge:Fair  Language:Good  Psychomotor Activity  Psychomotor Activity:Psychomotor Activity: Normal  Assets  Assets:Communication Skills; Physical Health; Social Support; Desire for Improvement  Sleep  Sleep:Sleep: Good Number of Hours of Sleep: 7  Physical Exam: Physical Exam Vitals and nursing  note reviewed.  Constitutional:      Appearance: Normal appearance.  HENT:     Head: Normocephalic.     Right Ear: External ear normal.     Left Ear: External ear normal.     Nose: Nose normal.     Mouth/Throat:     Mouth: Mucous  membranes are moist.     Pharynx: Oropharynx is clear.  Eyes:     Extraocular Movements: Extraocular movements intact.     Conjunctiva/sclera: Conjunctivae normal.     Pupils: Pupils are equal, round, and reactive to light.  Cardiovascular:     Rate and Rhythm: Normal rate.     Pulses: Normal pulses.  Pulmonary:     Effort: Pulmonary effort is normal.  Abdominal:     Palpations: Abdomen is soft.  Genitourinary:    Comments: deferred Musculoskeletal:        General: Normal range of motion.     Cervical back: Normal range of motion and neck supple.  Skin:    General: Skin is warm.  Neurological:     General: No focal deficit present.     Mental Status: She is alert and oriented to person, place, and time.  Psychiatric:        Behavior: Behavior normal.   Review of Systems  Constitutional: Negative.  Negative for chills and fever.  HENT: Negative.  Negative for hearing loss and tinnitus.   Eyes: Negative.  Negative for blurred vision and photophobia.  Respiratory: Negative.  Negative for cough, sputum production, shortness of breath and wheezing.   Cardiovascular: Negative.  Negative for chest pain and palpitations.  Gastrointestinal: Negative.  Negative for abdominal pain, constipation, diarrhea, heartburn, nausea and vomiting.  Genitourinary: Negative.  Negative for dysuria, frequency and urgency.  Musculoskeletal:  Positive for back pain. Negative for falls, joint pain, myalgias and neck pain.  Skin: Negative.  Negative for itching and rash.  Neurological: Negative.  Negative for dizziness, tingling, tremors, sensory change, speech change, focal weakness, seizures, loss of consciousness, weakness and headaches.  Endo/Heme/Allergies: Negative.  Negative for environmental allergies and polydipsia. Does not bruise/bleed easily.       Latex High  Itching    Psychiatric/Behavioral:  Positive for depression and substance abuse. Negative for hallucinations, memory loss and suicidal  ideas. The patient is nervous/anxious and has insomnia.   Blood pressure 114/79, pulse 92, temperature 98 F (36.7 C), resp. rate 18, height  (1.702 m), weight 68.2 kg, SpO2 100 %. Body mass index is 23.56 kg/m.  Treatment Plan Summary: Daily contact with patient to assess and evaluate symptoms and progress in treatment and Medication management   Observation Level/Precautions:  15 minute checks  Laboratory:  Labs reviewed   Psychotherapy:  Unit Group sessions  Medications:  See [facility]  Consultations:  To be determined   Discharge Concerns:  Safety, medication compliance, mood stability  Estimated LOS: 5-7 days  Other:  N/A    PLAN Safety and Monitoring: Voluntary admission to inpatient psychiatric unit for safety, stabilization and treatment Daily contact with patient to assess and evaluate symptoms and progress in treatment Patient's case to be discussed in multi-disciplinary team meeting Observation Level : q15 minute checks Vital signs: q12 hours Precautions: suicide   Long Term Goal(s): Improvement in symptoms so as ready for discharge   Short Term Goals: Ability to identify changes in lifestyle to reduce recurrence of condition will improve, Ability to disclose and discuss suicidal ideas, Ability to demonstrate self-control will improve, Ability  to identify and develop effective coping behaviors will improve, Compliance with prescribed medications will improve, and Ability to identify triggers associated with substance abuse/mental health issues will improve         Diagnoses Principal Problem:   MDD (major depressive disorder), recurrent episode (HCC) Active Problems:   Anxiety   Post-traumatic stress disorder, chronic   Alcohol abuse   Cocaine abuse (HCC)   Ulcerative colitis (HCC)                            Medications -Continue Hydroxyzine 25 mg every 6 hours PRN -Discontinued Effexor d/t h/o hypertension -Continue Pristiq 50 mg daily for MDD -Continue  Gabapentin 300mg  PO with breakfast and lunch and 600mg  PO QHS for anxiety/ETOH use d/o -Monitor for signs of withdrawal -Continue ETOH Ativan Detox protocol as per MAR - Oral thiamine and MVI replacement as per MAR -Continue Claritin 10 mg daily for seasonal allergies -Continue Losartan 50 mg daily for tachycardia -Continue Inderal 60 mg daily for hypertension - Abstinence from substances encouraged  - SW to look into options for outpatient SA treatment at discharge  -Trazodone to 100 mg nightly PRN for insomnia -Lidoderm patch daily for lower back pain -Continue Mesalamine EC 4.8 g daily for  ulcerative colitis -Continue albuterol inhaler PRN for wheezing/SOB   Other PRNS -Continue Tylenol 650 mg every 6 hours PRN for mild pain -Continue Maalox 30 mg every 4 hrs PRN for indigestion -Continue Imodium 2-4 mg as needed for diarrhea -Continue Milk of Magnesia as needed every 6 hrs for constipation -Continue Zofran disintegrating tabs every 6 hrs PRN for nausea    Discharge Planning: Social work and case management to assist with discharge planning and identification of hospital follow-up needs prior to discharge Estimated LOS: 5-7 days Discharge Concerns: Need to establish a safety plan; Medication compliance and effectiveness Discharge Goals: Return home with outpatient referrals for mental health follow-up including medication management/psychotherapy  Cecilie Lowersina C Jovonte Commins, FNP 09/10/2021, 2:06 PM Patient ID: Jordan Foster, female   DOB: [birth year], 41 y.o.   MRN: [id number]

## 2021-09-10 NOTE — [facility] Group Notes (Signed)
[facility] Group Notes:  (Nursing/MHT/Case Management/Adjunct)  Date:  09/10/2021  Time:  8:56 PM  Type of Therapy:   wrap up   Participation Level:  Active  Participation Quality:  Appropriate and Attentive  Affect:  Appropriate  Cognitive:  Appropriate  Insight:  Appropriate, Good, and Improving  Engagement in Group:  Developing/Improving  Modes of Intervention:  Discussion  Summary of Progress/Problems: PT says that she was able to speak with the doctor which was positive for her. She has a goal to work on her discharge plan. Lorita Officer 09/10/2021, 8:56 PM

## 2021-09-10 NOTE — Progress Notes (Signed)
D. Pt presented with a sad affect/ anxious mood- rated her depression, hopelessness and anxiety a 4/4/5, respectively. Pt reported sleeping well last night, but endorsed poor concentration and low energy level today. Pt currently denies SI/HI and AVH .  A. Labs and vitals monitored. Pt given scheduled meds and  lidocaine patch for back pain 6/10.  Pt supported emotionally and encouraged to express concerns and ask questions.   R. Pt remains safe with 15 minute checks. Will continue POC.

## 2021-09-11 NOTE — [facility] Group Notes (Signed)
Adult Psychoeducational Group Not Date:  09/11/2021 Time:  2778-2423 Group Topic/Focus: PROGRESSIVE RELAXATION. A group where deep breathing is taught and tensing and relaxation muscle groups is used. Imagery is used as well.  Pts are asked to imagine 3 pillars that hold them up when they are not able to hold themselves up and to share that with the group.  Participation Level:  Active  Participation Quality:  Appropriate  Affect:  Appropriate  Cognitive:  Oriented  Insight: Improving  Engagement in Group:  Engaged  Modes of Intervention:  Activity, Discussion, Education, and Support  Additional Comments:  Rates her energy at a 6/10. States that her dad, her brother and her step mom hold her up.  Dione Housekeeper

## 2021-09-11 NOTE — Progress Notes (Signed)
[facility]  09/11/2021 9:35 AM [name]  MRN:  [id number]  Subjective:  Jordan Foster reports: "My mood is okay. I need a therapist as soon as possible before I am discharged. This has helped me a lot in the past."  Brief History: Jordan Foster is a 41 y.o. Caucasian female with a history of PTSD, MDD, GAD & polysubstance abuse who [place] [facility] with complaints of worsening depressive symptoms and +SI. Pt denied having a plan this time, but reports a suicide attempt in 2018 via overdosing on 3 months supply of her medications & cocaine, leading to "flat-lining" for 7 minutes. For this admission, pt reports that she felt the onset of symptoms that she experienced prior to her overdose in 2018, and was transferred to this [place] [facility] for treatment and stabilization of her mood.  Daily Patient assessment Note: For this encounter, pt is seen in her room on the 300 hall.  Taeler was seen face to face this morning in her room. Chart reviewed and findings shared with the Tx Team and discussed with Dr. Loleta ChanceHill. She is getting along with peers and actively participating in therapeutic milieu and group therapy. Alert and oriented x 4 . Speech fluent and congruent. Mood / Affect appropriate, anxious, depressed and Euthymic. Thought process coherent and linear. Thought content is within normal limit.  Rated depression as "4" and anxiety as "6" on a scale of 0 to 10. Sleeping better with trazodone, however, last night she was so anxious and could feel her heart beat pounding, and was able to sleep for 5 hours. Denied SI, HI, AVH. She is having less back pain and continues with gabapentin as ordered. She continues to struggle with recall sometime due to her history of TBI. Encourage to look into Omega 3 supplement that could help with this issue. She has poor future orientation, both short and long term. Made patient  aware that as per the SW, she is scheduled with the Therapist on 09/21/21 and another appointment with the Psychiatrist on 09/30/21.  Principal Problem: MDD (major depressive disorder), recurrent episode (HCC) Diagnosis: Principal Problem:   MDD (major depressive disorder), recurrent episode (HCC) Active Problems:   Anxiety   Post-traumatic stress disorder, chronic   Alcohol abuse   Cocaine abuse (HCC)   Ulcerative colitis (HCC)  Total Time spent with patient: 30 minutes  Past Psychiatric History: as above  Past Medical History:  Past Medical History:  Diagnosis Date   Asthma    Depression    Hypertension    Positive TB test     Past Surgical History:  Procedure Laterality Date   LASIK     NASAL SINUS SURGERY     Family History:  Family History  Problem Relation Age of Onset   Hypertension Mother    Family Psychiatric  History: maternal cousin completed suicide, mother with depression. Social History:  Social History   Substance and Sexual Activity  Alcohol Use Yes   Alcohol/week: 35.0 standard drinks   Types: 35 Shots of liquor per week     Social History   Substance and Sexual Activity  Drug Use Yes   Frequency: 3.0 times per week   Types: Marijuana, Cocaine    Social History   Socioeconomic History   Marital status: Single    Spouse name: Not on file   Number of children: 0   Years of education: 16   Highest education  level: Not on file  Occupational History   Occupation: [employer]   Tobacco Use   Smoking status: Never   Smokeless tobacco: Never  Vaping Use   Vaping Use: Never used  Substance and Sexual Activity   Alcohol use: Yes    Alcohol/week: 35.0 standard drinks    Types: 35 Shots of liquor per week   Drug use: Yes    Frequency: 3.0 times per week    Types: Marijuana, Cocaine   Sexual activity: Yes    Comment: W/ Female Partner  Other Topics Concern   Not on file  Social History Narrative   Fun/Hobby: Working to determine    Social Determinants of Corporate investment banker Strain: Not on file  Food Insecurity: Not on file  Transportation Needs: Not on file  Physical Activity: Not on file  Stress: Not on file  Social Connections: Not on file   Additional Social History:     Sleep: Fair  Appetite:  Poor  Current Medications: Current Facility-Administered Medications  Medication Dose Route Frequency Provider Last Rate Last Admin   acetaminophen (TYLENOL) tablet 650 mg  650 mg Oral Q6H PRN Estella Husk, MD   650 mg at 09/11/21 0746   albuterol (VENTOLIN HFA) 108 (90 Base) MCG/ACT inhaler 2 puff  2 puff Inhalation Q4H PRN Estella Husk, MD       alum & mag hydroxide-simeth (MAALOX/MYLANTA) 200-200-20 MG/5ML suspension 30 mL  30 mL Oral Q4H PRN Estella Husk, MD       ARIPiprazole (ABILIFY) tablet 10 mg  10 mg Oral Daily Nkwenti, Doris, NP   10 mg at 09/11/21 0744   desvenlafaxine (PRISTIQ) 24 hr tablet 50 mg  50 mg Oral Daily Nkwenti, Doris, NP   50 mg at 09/11/21 0744   gabapentin (NEURONTIN) capsule 300 mg  300 mg Oral BID WC Hill, Shelbie Hutching, MD   300 mg at 09/11/21 0744   gabapentin (NEURONTIN) capsule 600 mg  600 mg Oral QHS Hill, Shelbie Hutching, MD   600 mg at 09/10/21 2104   hydrOXYzine (ATARAX) tablet 25 mg  25 mg Oral TID PRN Cecilie Lowers, FNP   25 mg at 09/11/21 0745   lidocaine (LIDODERM) 5 % 1 patch  1 patch Transdermal Daily Starleen Blue, NP   1 patch at 09/11/21 0757   loratadine (CLARITIN) tablet 10 mg  10 mg Oral Daily Estella Husk, MD   10 mg at 09/11/21 0744   losartan (COZAAR) tablet 50 mg  50 mg Oral Daily Otho Bellows, RPH   50 mg at 09/11/21 0743   magnesium hydroxide (MILK OF MAGNESIA) suspension 30 mL  30 mL Oral Daily PRN Estella Husk, MD       melatonin tablet 5 mg  5 mg Oral QHS Hill, Shelbie Hutching, MD   5 mg at 09/10/21 2104   mesalamine (LIALDA) EC tablet 4.8 g  4.8 g Oral Daily Otho Bellows, RPH   4.8 g at 09/11/21 1017    mometasone-formoterol (DULERA) 200-5 MCG/ACT inhaler 2 puff  2 puff Inhalation BID Estella Husk, MD   2 puff at 09/11/21 5102   multivitamin with minerals tablet 1 tablet  1 tablet Oral Daily Massengill, Harrold Donath, MD   1 tablet at 09/11/21 0744   omega-3 acid ethyl esters (LOVAZA) capsule 1 g  1 g Oral BID Roselle Locus, MD   1 g at 09/11/21 0743   ondansetron (ZOFRAN) tablet  4 mg  4 mg Oral Daily Estella Husk, MD   4 mg at 09/11/21 0743   pantoprazole (PROTONIX) EC tablet 40 mg  40 mg Oral Daily Estella Husk, MD   40 mg at 09/11/21 1610   polyvinyl alcohol (LIQUIFILM TEARS) 1.4 % ophthalmic solution 1 drop  1 drop Both Eyes TID PRN Estella Husk, MD   1 drop at 09/07/21 1200   prazosin (MINIPRESS) capsule 2 mg  2 mg Oral QHS Estella Husk, MD   2 mg at 09/10/21 2104   propranolol ER (INDERAL LA) 24 hr capsule 60 mg  60 mg Oral Daily Estella Husk, MD   60 mg at 09/11/21 0743   psyllium (HYDROCIL/METAMUCIL) 1 packet  1 packet Oral Daily Starleen Blue, NP   1 packet at 09/11/21 9604   thiamine tablet 100 mg  100 mg Oral Daily Massengill, Harrold Donath, MD   100 mg at 09/11/21 0744   traZODone (DESYREL) tablet 100 mg  100 mg Oral QHS PRN Starleen Blue, NP   100 mg at 09/10/21 2103    Lab Results:  No results found for this or any previous visit (from the past 48 hour(s)).   Blood Alcohol level:  Lab Results  Component Value Date   ETH 73 (H) 09/03/2021    Metabolic Disorder Labs: Lab Results  Component Value Date   HGBA1C 5.2 09/03/2021   MPG 102.54 09/03/2021   No results found for: PROLACTIN Lab Results  Component Value Date   CHOL 230 (H) 09/03/2021   TRIG 77 09/03/2021   HDL 130 09/03/2021   CHOLHDL 1.8 09/03/2021   VLDL 15 09/03/2021   LDLCALC 85 09/03/2021    Physical Findings: AIMS: 0 CIWA:  CIWA-Ar Total: 2 COWS:   n/a  Musculoskeletal: Strength & Muscle Tone: within normal limits Gait & Station: normal Patient leans:  N/A  Psychiatric Specialty Exam:  Presentation  General Appearance: Appropriate for Environment; Casual; Fairly Groomed  Eye Contact:Good  Speech:Clear and Coherent; Normal Rate  Speech Volume:Normal  Handedness:Right  Mood and Affect  Mood:Euthymic; Depressed; Anxious  Affect:Appropriate; Congruent  Thought Process  Thought Processes:Coherent; Linear  Descriptions of Associations:Intact  Orientation:Full (Time, Place and Person)  Thought Content:Logical; WDL  History of Schizophrenia/Schizoaffective disorder:No  Duration of Psychotic Symptoms:No data recorded Hallucinations:Hallucinations: None Description of Command Hallucinations: N/A  Ideas of Reference:None  Suicidal Thoughts:Suicidal Thoughts: No SI Passive Intent and/or Plan: -- (N/A)  Homicidal Thoughts:Homicidal Thoughts: No  Sensorium  Memory:Immediate Fair; Recent Fair; Remote Fair  Judgment:Fair  Insight:Fair  Executive Functions  Concentration:Good  Attention Span:Good  Recall:Good  Fund of Knowledge:Fair  Language:Good  Psychomotor Activity  Psychomotor Activity:Psychomotor Activity: Normal  Assets  Assets:Communication Skills; Physical Health; Social Support; Desire for Improvement  Sleep  Sleep:Sleep: Good Number of Hours of Sleep: 5  Physical Exam: Physical Exam Vitals and nursing note reviewed.  Constitutional:      Appearance: Normal appearance.  HENT:     Head: Normocephalic.     Right Ear: External ear normal.     Left Ear: External ear normal.     Nose: Nose normal.     Mouth/Throat:     Mouth: Mucous membranes are moist.     Pharynx: Oropharynx is clear.  Eyes:     Extraocular Movements: Extraocular movements intact.     Conjunctiva/sclera: Conjunctivae normal.     Pupils: Pupils are equal, round, and reactive to light.  Cardiovascular:     Rate and Rhythm:  Normal rate.     Pulses: Normal pulses.     Comments: BP 111/61, P 127. Nursing staff to  recheck. Pulmonary:     Effort: Pulmonary effort is normal.  Abdominal:     Palpations: Abdomen is soft.  Genitourinary:    Comments: deferred Musculoskeletal:        General: Normal range of motion.     Cervical back: Normal range of motion and neck supple.  Skin:    General: Skin is warm.  Neurological:     General: No focal deficit present.     Mental Status: She is alert and oriented to person, place, and time.  Psychiatric:        Behavior: Behavior normal.   Review of Systems  Constitutional: Negative.  Negative for chills and fever.  HENT: Negative.  Negative for hearing loss and tinnitus.   Eyes: Negative.  Negative for blurred vision and photophobia.  Respiratory: Negative.  Negative for cough, sputum production, shortness of breath and wheezing.   Cardiovascular: Negative.  Negative for chest pain and palpitations.       BP 111/61, P 127. Nursing staff to recheck.   Gastrointestinal: Negative.  Negative for abdominal pain, constipation, diarrhea, heartburn, nausea and vomiting.  Genitourinary: Negative.  Negative for dysuria, frequency and urgency.  Musculoskeletal:  Positive for back pain. Negative for falls, joint pain, myalgias and neck pain.  Skin: Negative.  Negative for itching and rash.  Neurological: Negative.  Negative for dizziness, tingling, tremors, sensory change, speech change, focal weakness, seizures, loss of consciousness, weakness and headaches.  Endo/Heme/Allergies: Negative.  Negative for environmental allergies and polydipsia. Does not bruise/bleed easily.       Latex High  Itching    Psychiatric/Behavioral:  Positive for depression and substance abuse. Negative for hallucinations, memory loss and suicidal ideas. The patient is nervous/anxious and has insomnia.   Blood pressure 111/61, pulse (!) 127, temperature 97.6 F (36.4 C), resp. rate 18, height  (1.702 m), weight 68.2 kg, SpO2 98 %. Body mass index is 23.56 kg/m.  Treatment Plan  Summary: Daily contact with patient to assess and evaluate symptoms and progress in treatment and Medication management   Observation Level/Precautions:  15 minute checks  Laboratory:  Labs reviewed   Psychotherapy:  Unit Group sessions  Medications:  See [facility]  Consultations:  To be determined   Discharge Concerns:  Safety, medication compliance, mood stability  Estimated LOS: 5-7 days  Other:  N/A    PLAN Safety and Monitoring: Voluntary admission to inpatient psychiatric unit for safety, stabilization and treatment Daily contact with patient to assess and evaluate symptoms and progress in treatment Patient's case to be discussed in multi-disciplinary team meeting Observation Level : q15 minute checks Vital signs: q12 hours Precautions: suicide   Long Term Goal(s): Improvement in symptoms so as ready for discharge   Short Term Goals: Ability to identify changes in lifestyle to reduce recurrence of condition will improve, Ability to disclose and discuss suicidal ideas, Ability to demonstrate self-control will improve, Ability to identify and develop effective coping behaviors will improve, Compliance with prescribed medications will improve, and Ability to identify triggers associated with substance abuse/mental health issues will improve         Diagnoses Principal Problem:   MDD (major depressive disorder), recurrent episode (HCC) Active Problems:   Anxiety   Post-traumatic stress disorder, chronic   Alcohol abuse   Cocaine abuse (HCC)   Ulcerative colitis (HCC)  Medications -Continue Hydroxyzine 25 mg every 6 hours PRN -Discontinued Effexor d/t h/o hypertension -Continue Pristiq 50 mg daily for MDD -Continue Gabapentin 300mg  PO with breakfast and lunch and 600mg  PO QHS for anxiety/ETOH use d/o -Monitor for signs of withdrawal -Continue ETOH Ativan Detox protocol as per MAR - Oral thiamine and MVI replacement as per MAR -Continue Claritin 10 mg  daily for seasonal allergies -Continue Losartan 50 mg daily for tachycardia -Continue Inderal 60 mg daily for hypertension - Abstinence from substances encouraged  - SW to look into options for outpatient SA treatment at discharge  -Trazodone to 100 mg nightly PRN for insomnia -Lidoderm patch daily for lower back pain -Continue Mesalamine EC 4.8 g daily for  ulcerative colitis -Continue albuterol inhaler PRN for wheezing/SOB   Other PRNS -Continue Tylenol 650 mg every 6 hours PRN for mild pain -Continue Maalox 30 mg every 4 hrs PRN for indigestion -Continue Imodium 2-4 mg as needed for diarrhea -Continue Milk of Magnesia as needed every 6 hrs for constipation -Continue Zofran disintegrating tabs every 6 hrs PRN for nausea    Discharge Planning: Social work and case management to assist with discharge planning and identification of hospital follow-up needs prior to discharge Estimated LOS: 5-7 days Discharge Concerns: Need to establish a safety plan; Medication compliance and effectiveness Discharge Goals: Return home with outpatient referrals for mental health follow-up including medication management/psychotherapy  , FNP 09/11/2021, 9:35 AM  Patient ID: Cecilie Lowers, female   DOB: 04-02-1981, 41 y.o.   MRN: [id number]

## 2021-09-11 NOTE — Progress Notes (Signed)
   09/11/21 2203  Psych Admission Type (Psych Patients Only)  Admission Status Voluntary  Psychosocial Assessment  Patient Complaints Anxiety  Eye Contact Fair  Facial Expression Animated  Affect Appropriate to circumstance  Speech Logical/coherent  Interaction Assertive  Motor Activity Other (Comment) (WDL)  Appearance/Hygiene Unremarkable  Behavior Characteristics Appropriate to situation  Mood Anxious;Pleasant  Thought Process  Coherency WDL  Content WDL  Delusions None reported or observed  Perception WDL  Hallucination None reported or observed  Judgment Impaired  Confusion None  Danger to Self  Current suicidal ideation? Denies  Self-Injurious Behavior No self-injurious ideation or behavior indicators observed or expressed   Agreement Not to Harm Self Yes  Description of Agreement verbal  Danger to Others  Danger to Others None reported or observed

## 2021-09-11 NOTE — [facility] Group Notes (Signed)
.  Psychoeducational Group Note    Date:  5/21//23 Time: 1300-1400    Purpose of Group: . The group focus' on teaching patients on how to identify their needs and their Life Skills:  A group where two lists are made. What people need and what are things that we do that are unhealthy. The lists are developed by the patients and it is explained that we often do the actions that are not healthy to get our list of needs met.  Goal:: to develop the coping skills needed to get their needs met  Participation Level:  Active  Participation Quality:  Appropriate  Affect:  Appropriate  Cognitive:  Oriented  Insight:  Improving  Engagement in Group:  Engaged  Additional Comments: Rates her energy at an 8. Participated in the group.  Paulino Rily

## 2021-09-11 NOTE — Progress Notes (Signed)
D. Pt continues to complain of back pain, 6/10, and increased anxiety. Pt rated her depression, hopelessness and anxiety a 3/3/6, respectively.  Pt reported that her goal was "to work on getting a therapist."  Pt currently denies SI/HI and AVH  Pt has been visible in the milieu interacting well with peers and attending groups A. Labs and vitals monitored. Pt given and educated on medications. Pt given Tylenol for back pain, and Vistaril for anxiety. Pt supported emotionally and encouraged to express concerns and ask questions.   R. Pt remains safe with 15 minute checks. Will continue POC.

## 2021-09-11 NOTE — Progress Notes (Signed)
[facility] Group Notes:  (Nursing/MHT/Case Management/Adjunct)  Date:  09/11/2021  Time:  2015  Type of Therapy:   wrap up group  Participation Level:  Active  Participation Quality:  Appropriate, Attentive, Sharing, and Supportive  Affect:  Appropriate  Cognitive:  Alert  Insight:  Improving  Engagement in Group:  Engaged  Modes of Intervention:  Clarification, Education, and Support  Summary of Progress/Problems Positive thinking and self-care were discussed.    Marcille Buffy 09/11/2021, 9:20 PM

## 2021-09-12 ENCOUNTER — Encounter (HOSPITAL_COMMUNITY): Payer: Self-pay

## 2021-09-12 MED ORDER — WHITE PETROLATUM EX OINT
TOPICAL_OINTMENT | CUTANEOUS | Status: AC
  Filled 2021-09-12: qty 10

## 2021-09-12 MED ORDER — METHOCARBAMOL 500 MG PO TABS
500.0000 mg | ORAL_TABLET | Freq: Three times a day (TID) | ORAL | Status: DC
  Administered 2021-09-12 – 2021-09-13 (×2): 500 mg via ORAL
  Filled 2021-09-12 (×6): qty 1

## 2021-09-12 MED ORDER — METHOCARBAMOL 500 MG PO TABS
500.0000 mg | ORAL_TABLET | Freq: Four times a day (QID) | ORAL | Status: DC | PRN
  Administered 2021-09-12: 500 mg via ORAL
  Filled 2021-09-12: qty 1

## 2021-09-12 NOTE — [facility] Group Notes (Signed)
  Spiritual care group on grief and loss facilitated by chaplain Dyanne Carrel, [facility]   Group Goal:   Support / Education around grief and loss   Members engage in facilitated group support and psycho-social education.   Group Description:   Following introductions and group rules, group members engaged in facilitated group dialog and support around topic of loss, with particular support around experiences of loss in their lives. Group Identified types of loss (relationships / self / things) and identified patterns, circumstances, and changes that precipitate losses. Reflected on thoughts / feelings around loss, normalized grief responses, and recognized variety in grief experience. Group noted Worden's four tasks of grief in discussion.   Group drew on Adlerian / Rogerian, narrative, MI,   Patient Progress: Jordan Foster attended group for the first part, before being called out to meet with provider.  Jordan Foster participated and showed engagement during the duration of their time in the group.  [address] [facility] Pager, [phone]

## 2021-09-12 NOTE — BH IP Treatment Plan (Signed)
Interdisciplinary Treatment and Diagnostic Plan Update  09/12/2021 Time of Session: 0830 Ellason KESHON LEFLER MRN: [id number]  Principal Diagnosis: MDD (major depressive disorder), recurrent episode ([place])  Secondary Diagnoses: Principal Problem:   MDD (major depressive disorder), recurrent episode ([place]) Active Problems:   Anxiety   Post-traumatic stress disorder, chronic   Alcohol abuse   Cocaine abuse ([place])   Ulcerative colitis ([place])   Current Medications:  Current Facility-Administered Medications  Medication Dose Route Frequency Provider Last Rate Last Admin   acetaminophen (TYLENOL) tablet 650 mg  650 mg Oral Q6H PRN Ival Bible, MD   650 mg at 09/11/21 0746   albuterol (VENTOLIN HFA) 108 (90 Base) MCG/ACT inhaler 2 puff  2 puff Inhalation Q4H PRN Ival Bible, MD       alum & mag hydroxide-simeth (MAALOX/MYLANTA) 200-200-20 MG/5ML suspension 30 mL  30 mL Oral Q4H PRN Ival Bible, MD       ARIPiprazole (ABILIFY) tablet 10 mg  10 mg Oral Daily Nkwenti, Doris, NP   10 mg at 09/12/21 0819   desvenlafaxine (PRISTIQ) 24 hr tablet 50 mg  50 mg Oral Daily Nkwenti, Tamela Oddi, NP   50 mg at 09/12/21 [id number]   gabapentin (NEURONTIN) capsule 300 mg  300 mg Oral BID WC Hill, Jackie Plum, MD   300 mg at 09/12/21 0819   gabapentin (NEURONTIN) capsule 600 mg  600 mg Oral QHS Hill, Jackie Plum, MD   600 mg at 09/11/21 2059   hydrOXYzine (ATARAX) tablet 25 mg  25 mg Oral TID PRN Laretta Bolster, FNP   25 mg at 09/12/21 0825   lidocaine (LIDODERM) 5 % 1 patch  1 patch Transdermal Daily Nicholes Rough, NP   1 patch at 09/12/21 [id number]   loratadine (CLARITIN) tablet 10 mg  10 mg Oral Daily Ival Bible, MD   10 mg at 09/12/21 [id number]   losartan (COZAAR) tablet 50 mg  50 mg Oral Daily Minda Ditto, RPH   50 mg at 09/12/21 [id number]   magnesium hydroxide (MILK OF MAGNESIA) suspension 30 mL  30 mL Oral Daily PRN Ival Bible, MD       melatonin tablet 5 mg  5 mg Oral QHS  Hill, Jackie Plum, MD   5 mg at 09/11/21 2059   mesalamine (LIALDA) EC tablet 4.8 g  4.8 g Oral Daily Minda Ditto, RPH   4.8 g at 09/12/21 [id number]   mometasone-formoterol (DULERA) 200-5 MCG/ACT inhaler 2 puff  2 puff Inhalation BID Ival Bible, MD   2 puff at 09/12/21 0818   multivitamin with minerals tablet 1 tablet  1 tablet Oral Daily Massengill, Ovid Curd, MD   1 tablet at 09/12/21 [id number]   omega-3 acid ethyl esters (LOVAZA) capsule 1 g  1 g Oral BID Maida Sale, MD   1 g at 09/12/21 0821   ondansetron (ZOFRAN) tablet 4 mg  4 mg Oral Daily Ival Bible, MD   4 mg at 09/12/21 [id number]   pantoprazole (PROTONIX) EC tablet 40 mg  40 mg Oral Daily Ival Bible, MD   40 mg at 09/12/21 [id number]   polyvinyl alcohol (LIQUIFILM TEARS) 1.4 % ophthalmic solution 1 drop  1 drop Both Eyes TID PRN Ival Bible, MD   1 drop at 09/11/21 1810   prazosin (MINIPRESS) capsule 2 mg  2 mg Oral QHS Ival Bible, MD   2 mg at 09/11/21 2100   propranolol ER (INDERAL LA) 24 hr capsule  60 mg  60 mg Oral Daily Ival Bible, MD   60 mg at 09/12/21 0818   psyllium (HYDROCIL/METAMUCIL) 1 packet  1 packet Oral Daily Nicholes Rough, NP   1 packet at 09/11/21 [id number]   thiamine tablet 100 mg  100 mg Oral Daily Massengill, Nathan, MD   100 mg at 09/12/21 0820   traZODone (DESYREL) tablet 100 mg  100 mg Oral QHS PRN Nicholes Rough, NP   100 mg at 09/11/21 2059   PTA Medications: Medications Prior to Admission  Medication Sig Dispense Refill Last Dose   albuterol (VENTOLIN HFA) 108 (90 Base) MCG/ACT inhaler Inhale 2 puffs into the lungs every 4 (four) hours as needed.      ARIPiprazole (ABILIFY) 5 MG tablet Take 5 mg by mouth daily.      budesonide-formoterol (SYMBICORT) 160-4.5 MCG/ACT inhaler Inhale 2 puffs into the lungs 2 (two) times daily.      carboxymethylcellulose (REFRESH PLUS) 0.5 % SOLN Place 1 drop into both eyes 3 (three) times daily as needed.      cetirizine (ZYRTEC)  10 MG tablet Take 10 mg by mouth daily.      losartan (COZAAR) 50 MG tablet Take 50 mg by mouth daily.      mesalamine (LIALDA) 1.2 g EC tablet Take 4.8 g by mouth daily.      ondansetron (ZOFRAN) 4 MG tablet Take 1 tablet by mouth daily.      pantoprazole (PROTONIX) 40 MG tablet Take 40 mg by mouth daily.      prazosin (MINIPRESS) 2 MG capsule Take 2 mg by mouth at bedtime.      propranolol ER (INDERAL LA) 60 MG 24 hr capsule Take 60 mg by mouth daily.      traZODone (DESYREL) 50 MG tablet TAKE 1 TABLET (50 MG TOTAL) BY MOUTH AT BEDTIME AS NEEDED FOR SLEEP. 90 tablet 0    venlafaxine XR (EFFEXOR-XR) 37.5 MG 24 hr capsule Take 1 capsule (37.5 mg total) by mouth daily with breakfast.       Patient Stressors: Financial difficulties   Health problems   Marital or family conflict   Occupational concerns    Patient Strengths: Hydrographic surveyor for treatment/growth   Treatment Modalities: Medication Management, Group therapy, Case management,  1 to 1 session with clinician, Psychoeducation, Recreational therapy.   Physician Treatment Plan for Primary Diagnosis: MDD (major depressive disorder), recurrent episode ([place]) Long Term Goal(s): Improvement in symptoms so as ready for discharge   Short Term Goals: Ability to identify changes in lifestyle to reduce recurrence of condition will improve Ability to disclose and discuss suicidal ideas Ability to demonstrate self-control will improve Ability to identify and develop effective coping behaviors will improve Compliance with prescribed medications will improve Ability to identify triggers associated with substance abuse/mental health issues will improve  Medication Management: Evaluate patient's response, side effects, and tolerance of medication regimen.  Therapeutic Interventions: 1 to 1 sessions, Unit Group sessions and Medication administration.  Evaluation of Outcomes: Progressing  Physician Treatment Plan for  Secondary Diagnosis: Principal Problem:   MDD (major depressive disorder), recurrent episode ([place]) Active Problems:   Anxiety   Post-traumatic stress disorder, chronic   Alcohol abuse   Cocaine abuse ([place])   Ulcerative colitis ([place])  Long Term Goal(s): Improvement in symptoms so as ready for discharge   Short Term Goals: Ability to identify changes in lifestyle to reduce recurrence of condition will improve Ability to disclose and discuss suicidal ideas Ability  to demonstrate self-control will improve Ability to identify and develop effective coping behaviors will improve Compliance with prescribed medications will improve Ability to identify triggers associated with substance abuse/mental health issues will improve     Medication Management: Evaluate patient's response, side effects, and tolerance of medication regimen.  Therapeutic Interventions: 1 to 1 sessions, Unit Group sessions and Medication administration.  Evaluation of Outcomes: Progressing   RN Treatment Plan for Primary Diagnosis: MDD (major depressive disorder), recurrent episode ([place]) Long Term Goal(s): Knowledge of disease and therapeutic regimen to maintain health will improve  Short Term Goals: Ability to remain free from injury will improve, Ability to verbalize frustration and anger appropriately will improve, Ability to demonstrate self-control, Ability to participate in decision making will improve, Ability to verbalize feelings will improve, Ability to disclose and discuss suicidal ideas, Ability to identify and develop effective coping behaviors will improve, and Compliance with prescribed medications will improve  Medication Management: RN will administer medications as ordered by provider, will assess and evaluate patient's response and provide education to patient for prescribed medication. RN will report any adverse and/or side effects to prescribing provider.  Therapeutic Interventions: 1 on 1 counseling  sessions, Psychoeducation, Medication administration, Evaluate responses to treatment, Monitor vital signs and CBGs as ordered, Perform/monitor CIWA, COWS, AIMS and Fall Risk screenings as ordered, Perform wound care treatments as ordered.  Evaluation of Outcomes: Progressing   LCSW Treatment Plan for Primary Diagnosis: MDD (major depressive disorder), recurrent episode ([place]) Long Term Goal(s): Safe transition to appropriate next level of care at discharge, Engage patient in therapeutic group addressing interpersonal concerns.  Short Term Goals: Engage patient in aftercare planning with referrals and resources, Increase social support, Increase ability to appropriately verbalize feelings, Increase emotional regulation, Facilitate acceptance of mental health diagnosis and concerns, Facilitate patient progression through stages of change regarding substance use diagnoses and concerns, and Identify triggers associated with mental health/substance abuse issues  Therapeutic Interventions: Assess for all discharge needs, 1 to 1 time with Social worker, Explore available resources and support systems, Assess for adequacy in community support network, Educate family and significant other(s) on suicide prevention, Complete Psychosocial Assessment, Interpersonal group therapy.  Evaluation of Outcomes: Progressing   Progress in Treatment: Attending groups: Yes. Participating in groups: Yes. Taking medication as prescribed: Yes. Toleration medication: Yes. Family/Significant other contact made: Yes, individual(s) contacted:  SPE completed with Walker Shadow  Patient understands diagnosis: Yes. Discussing patient identified problems/goals with staff: Yes. Medical problems stabilized or resolved: Yes. Denies suicidal/homicidal ideation: Yes. Issues/concerns per patient self-inventory: Yes. Other: none  New problem(s) identified: No, Describe:  No additional problems/concerns expressed at this time.    New Short Term/Long Term Goal(s): Patient to work towards detox, medication management for mood stabilization; elimination of SI thoughts; development of comprehensive mental wellness/sobriety plan.  Patient Goals:  No additional goals identified at this time. Patient to continue to work towards original goals identified in initial treatment team meeting. CSW will remain available to patient should they voice additional treatment goals.   Discharge Plan or Barriers: No psychosocial barriers identified at this time, patient to return to place of residence when appropriate for discharge.   Reason for Continuation of Hospitalization: Depression  Estimated Length of Stay: 1-7 days   Last 3 [place] Suicide Severity Risk Score: Flowsheet Row Admission (Current) from 09/06/2021 in [facility] ED from 09/03/2021 in [facility] ED from 12/26/2020 in [facility]  [place]  No Risk No Risk No Risk       Last PHQ 2/9 Scores:    09/03/2021    8:20 AM 09/22/2016   10:30 AM  Depression screen PHQ 2/9  Decreased Interest 3 1  Down, Depressed, Hopeless 3 0  PHQ - 2 Score 6 1  Altered sleeping 3 1  Tired, decreased energy 2 0  Change in appetite 1 1  Feeling bad or failure about yourself  3 0  Trouble concentrating 2 1  Moving slowly or fidgety/restless 0 0  Suicidal thoughts 2 0  PHQ-9 Score 19 4  Difficult doing work/chores Somewhat difficult Not difficult at all    Scribe for Treatment Team: Durenda Hurt, Latanya Presser 09/12/2021 10:12 AM

## 2021-09-12 NOTE — Progress Notes (Signed)
[facility] MD Progress Note  09/12/2021 5:17 PM Jordan Foster  MRN:  [id number]  Subjective:  Jordan Foster reports: "I feel good. I really like this combination of medications that I am on. I've not felt like this for a long time."  Brief History: Jordan Foster is a 41 y.o. Caucasian female with a history of PTSD, MDD, GAD & polysubstance abuse who presented to the [facility] health urgent care [facility]) with complaints of worsening depressive symptoms and +SI. Pt denied having a plan this time, but reports a suicide attempt in 2018 via overdosing on 3 months supply of her medications & cocaine, leading to "flat-lining" for 7 minutes. For this admission, pt reports that she felt the onset of symptoms that she experienced prior to her overdose in 2018, and was transferred to this [place] health hospital for treatment and stabilization of her mood.  Daily Patient assessment Note: Pt AAOx4, presents today with a euthymic mood & affect is congruent. Pt's attention to personal hygiene and grooming is fair, eye contact is good, speech is clear & coherent. Thought contents are organized and logical, and pt currently denies SI/HI/AVH or paranoia. There is no evidence of delusional thoughts.    Pt reports that she is continuing to tolerate her medications well, she denies any medication related side effects, and states that this current medication regimen is the best she has been on. She reports that her depressive symptoms are resolving, her anxiety is resolved, and she feels ready for discharge. She is agreeable to being discharged tomorrow (5/23). Pt reports that her sleep quality last night was good, and reports a good appetite. She continues to reports lower back pain, is on a lidoderm patch which she states is not effective. Robaxin 500 mg PRN ordered, but she has been educated that she will not be discharging home on this medication, and verbalizes understanding.  Principal  Problem: MDD (major depressive disorder), recurrent episode ([place]) Diagnosis: Principal Problem:   MDD (major depressive disorder), recurrent episode ([place]) Active Problems:   Anxiety   Post-traumatic stress disorder, chronic   Alcohol abuse   Cocaine abuse ([place])   Ulcerative colitis ([place])  Total Time spent with patient: 30 minutes  Past Psychiatric History: as above  Past Medical History:  Past Medical History:  Diagnosis Date   Asthma    Depression    Hypertension    Positive TB test     Past Surgical History:  Procedure Laterality Date   LASIK     NASAL SINUS SURGERY     Family History:  Family History  Problem Relation Age of Onset   Hypertension Mother    Family Psychiatric  History: maternal cousin completed suicide, mother with depression. Social History:  Social History   Substance and Sexual Activity  Alcohol Use Yes   Alcohol/week: 35.0 standard drinks   Types: 35 Shots of liquor per week     Social History   Substance and Sexual Activity  Drug Use Yes   Frequency: 3.0 times per week   Types: Marijuana, Cocaine    Social History   Socioeconomic History   Marital status: Single    Spouse name: Not on file   Number of children: 0   Years of education: 16   Highest education level: Not on file  Occupational History   Occupation: [employer]   Tobacco Use   Smoking status: Never   Smokeless tobacco: Never  Vaping Use   Vaping Use: Never used  Substance and Sexual Activity   Alcohol use: Yes    Alcohol/week: 35.0 standard drinks    Types: 35 Shots of liquor per week   Drug use: Yes    Frequency: 3.0 times per week    Types: Marijuana, Cocaine   Sexual activity: Yes    Comment: W/ Female Partner  Other Topics Concern   Not on file  Social History Narrative   Fun/Hobby: Working to determine   Social Determinants of Radio broadcast assistant Strain: Not on file  Food Insecurity: Not on file  Transportation Needs: Not on file   Physical Activity: Not on file  Stress: Not on file  Social Connections: Not on file   Additional Social History:     Sleep: Fair  Appetite:  Poor  Current Medications: Current Facility-Administered Medications  Medication Dose Route Frequency Provider Last Rate Last Admin   acetaminophen (TYLENOL) tablet 650 mg  650 mg Oral Q6H PRN Ival Bible, MD   650 mg at 09/11/21 0746   albuterol (VENTOLIN HFA) 108 (90 Base) MCG/ACT inhaler 2 puff  2 puff Inhalation Q4H PRN Ival Bible, MD       alum & mag hydroxide-simeth (MAALOX/MYLANTA) 200-200-20 MG/5ML suspension 30 mL  30 mL Oral Q4H PRN Ival Bible, MD       ARIPiprazole (ABILIFY) tablet 10 mg  10 mg Oral Daily Delsa Walder, NP   10 mg at 09/12/21 0819   desvenlafaxine (PRISTIQ) 24 hr tablet 50 mg  50 mg Oral Daily Rhegan Trunnell, NP   50 mg at 09/12/21 [id number]   gabapentin (NEURONTIN) capsule 300 mg  300 mg Oral BID WC Hill, Jackie Plum, MD   300 mg at 09/12/21 1130   gabapentin (NEURONTIN) capsule 600 mg  600 mg Oral QHS Hill, Jackie Plum, MD   600 mg at 09/11/21 2059   hydrOXYzine (ATARAX) tablet 25 mg  25 mg Oral TID PRN Laretta Bolster, FNP   25 mg at 09/12/21 0825   lidocaine (LIDODERM) 5 % 1 patch  1 patch Transdermal Daily Nicholes Rough, NP   1 patch at 09/12/21 [id number]   loratadine (CLARITIN) tablet 10 mg  10 mg Oral Daily Ival Bible, MD   10 mg at 09/12/21 [id number]   losartan (COZAAR) tablet 50 mg  50 mg Oral Daily Minda Ditto, RPH   50 mg at 09/12/21 [id number]   magnesium hydroxide (MILK OF MAGNESIA) suspension 30 mL  30 mL Oral Daily PRN Ival Bible, MD       melatonin tablet 5 mg  5 mg Oral QHS Hill, Jackie Plum, MD   5 mg at 09/11/21 2059   mesalamine (LIALDA) EC tablet 4.8 g  4.8 g Oral Daily Minda Ditto, RPH   4.8 g at 09/12/21 [id number]   methocarbamol (ROBAXIN) tablet 500 mg  500 mg Oral Q6H PRN Nicholes Rough, NP   500 mg at 09/12/21 1245   methocarbamol (ROBAXIN) tablet 500 mg   500 mg Oral TID Nicholes Rough, NP   500 mg at 09/12/21 1716   mometasone-formoterol (DULERA) 200-5 MCG/ACT inhaler 2 puff  2 puff Inhalation BID Ival Bible, MD   2 puff at 09/12/21 0818   multivitamin with minerals tablet 1 tablet  1 tablet Oral Daily Massengill, Ovid Curd, MD   1 tablet at 09/12/21 [id number]   omega-3 acid ethyl esters (LOVAZA) capsule 1 g  1 g Oral BID Hill, Jackie Plum, MD   1 g  at 09/12/21 1716   ondansetron (ZOFRAN) tablet 4 mg  4 mg Oral Daily Ival Bible, MD   4 mg at 09/12/21 [id number]   pantoprazole (PROTONIX) EC tablet 40 mg  40 mg Oral Daily Ival Bible, MD   40 mg at 09/12/21 [id number]   polyvinyl alcohol (LIQUIFILM TEARS) 1.4 % ophthalmic solution 1 drop  1 drop Both Eyes TID PRN Ival Bible, MD   1 drop at 09/11/21 1810   prazosin (MINIPRESS) capsule 2 mg  2 mg Oral QHS Ival Bible, MD   2 mg at 09/11/21 2100   propranolol ER (INDERAL LA) 24 hr capsule 60 mg  60 mg Oral Daily Ival Bible, MD   60 mg at 09/12/21 0818   psyllium (HYDROCIL/METAMUCIL) 1 packet  1 packet Oral Daily Nicholes Rough, NP   1 packet at 09/11/21 [id number]   thiamine tablet 100 mg  100 mg Oral Daily Massengill, Ovid Curd, MD   100 mg at 09/12/21 0820   traZODone (DESYREL) tablet 100 mg  100 mg Oral QHS PRN Nicholes Rough, NP   100 mg at 09/11/21 2059   Lab Results:  No results found for this or any previous visit (from the past 82 hour(s)).  Blood Alcohol level:  Lab Results  Component Value Date   ETH 73 (H) 123456   Metabolic Disorder Labs: Lab Results  Component Value Date   HGBA1C 5.2 09/03/2021   MPG 102.54 09/03/2021   No results found for: PROLACTIN Lab Results  Component Value Date   CHOL 230 (H) 09/03/2021   TRIG 77 09/03/2021   HDL 130 09/03/2021   CHOLHDL 1.8 09/03/2021   VLDL 15 09/03/2021   LDLCALC 85 09/03/2021   Physical Findings: AIMS: 0 CIWA:  CIWA-Ar Total: 0 COWS:   n/a  Musculoskeletal: Strength & Muscle Tone:  within normal limits Gait & Station: normal Patient leans: N/A  Psychiatric Specialty Exam:  Presentation  General Appearance: Appropriate for Environment  Eye Contact:Good  Speech:Clear and Coherent  Speech Volume:Normal  Handedness:Right  Mood and Affect  Mood:Euthymic  Affect:Appropriate  Thought Process  Thought Processes:Coherent  Descriptions of Associations:Intact  Orientation:Full (Time, Place and Person)  Thought Content:Logical  History of Schizophrenia/Schizoaffective disorder:No  Duration of Psychotic Symptoms:No data recorded Hallucinations:Hallucinations: None Description of Command Hallucinations: N/A  Ideas of Reference:None  Suicidal Thoughts:Suicidal Thoughts: No SI Passive Intent and/or Plan: -- (N/A)  Homicidal Thoughts:Homicidal Thoughts: No  Sensorium  Memory:Immediate Good; Recent Good  Judgment:Good  Insight:Good  Executive Functions  Concentration:Good  Attention Span:Good  [place] of Knowledge:Good  Language:Good  Psychomotor Activity  Psychomotor Activity:Psychomotor Activity: Normal  Assets  Assets:Communication Skills  Sleep  Sleep:Sleep: Good Number of Hours of Sleep: 5  Physical Exam: Physical Exam Vitals and nursing note reviewed.  Constitutional:      Appearance: Normal appearance.  HENT:     Head: Normocephalic.     Right Ear: External ear normal.     Left Ear: External ear normal.     Nose: Nose normal.     Mouth/Throat:     Mouth: Mucous membranes are moist.     Pharynx: Oropharynx is clear.  Eyes:     Extraocular Movements: Extraocular movements intact.     Conjunctiva/sclera: Conjunctivae normal.     Pupils: Pupils are equal, round, and reactive to light.  Cardiovascular:     Rate and Rhythm: Normal rate.     Pulses: Normal pulses.     Comments: BP  111/61, P 127. Nursing staff to recheck. Pulmonary:     Effort: Pulmonary effort is normal.  Abdominal:     Palpations: Abdomen  is soft.  Genitourinary:    Comments: deferred Musculoskeletal:        General: Normal range of motion.     Cervical back: Normal range of motion and neck supple.  Skin:    General: Skin is warm.  Neurological:     General: No focal deficit present.     Mental Status: She is alert and oriented to person, place, and time.  Psychiatric:        Behavior: Behavior normal.   Review of Systems  Constitutional: Negative.  Negative for chills and fever.  HENT: Negative.  Negative for hearing loss and tinnitus.   Eyes: Negative.  Negative for blurred vision and photophobia.  Respiratory: Negative.  Negative for cough, sputum production, shortness of breath and wheezing.   Cardiovascular: Negative.  Negative for chest pain and palpitations.       BP 111/61, P 127. Nursing staff to recheck.   Gastrointestinal: Negative.  Negative for abdominal pain, constipation, diarrhea, heartburn, nausea and vomiting.  Genitourinary: Negative.  Negative for dysuria, frequency and urgency.  Musculoskeletal:  Positive for back pain. Negative for falls, joint pain, myalgias and neck pain.  Skin: Negative.  Negative for itching and rash.  Neurological: Negative.  Negative for dizziness, tingling, tremors, sensory change, speech change, focal weakness, seizures, loss of consciousness, weakness and headaches.  Endo/Heme/Allergies: Negative.  Negative for environmental allergies and polydipsia. Does not bruise/bleed easily.       Latex High  Itching    Psychiatric/Behavioral:  Positive for depression and substance abuse. Negative for hallucinations, memory loss and suicidal ideas. The patient is nervous/anxious and has insomnia.   Blood pressure (!) 138/103, pulse 86, temperature 98.9 F (37.2 C), temperature source Oral, resp. rate 17, height 5\' 7"  (1.702 m), weight 68.2 kg, SpO2 100 %. Body mass index is 23.56 kg/m.  Treatment Plan Summary: Daily contact with patient to assess and evaluate symptoms and progress  in treatment and Medication management   Observation Level/Precautions:  15 minute checks  Laboratory:  Labs reviewed   Psychotherapy:  Unit Group sessions  Medications:  See [facility]  Consultations:  To be determined   Discharge Concerns:  Safety, medication compliance, mood stability  Estimated LOS: 5-7 days  Other:  N/A    PLAN Safety and Monitoring: Voluntary admission to inpatient psychiatric unit for safety, stabilization and treatment Daily contact with patient to assess and evaluate symptoms and progress in treatment Patient's case to be discussed in multi-disciplinary team meeting Observation Level : q15 minute checks Vital signs: q12 hours Precautions: suicide   Long Term Goal(s): Improvement in symptoms so as ready for discharge   Short Term Goals: Ability to identify changes in lifestyle to reduce recurrence of condition will improve, Ability to disclose and discuss suicidal ideas, Ability to demonstrate self-control will improve, Ability to identify and develop effective coping behaviors will improve, Compliance with prescribed medications will improve, and Ability to identify triggers associated with substance abuse/mental health issues will improve         Diagnoses Principal Problem:   MDD (major depressive disorder), recurrent episode ([place]) Active Problems:   Anxiety   Post-traumatic stress disorder, chronic   Alcohol abuse   Cocaine abuse ([place])   Ulcerative colitis ([place])  Medications -Continue Hydroxyzine 25 mg every 6 hours PRN -Discontinued Effexor d/t h/o hypertension -Continue Pristiq 50 mg daily for MDD -Continue Gabapentin 300mg  PO with breakfast and lunch and 600mg  PO QHS for anxiety/ETOH use d/o -Monitor for signs of withdrawal - Oral thiamine and MVI replacement as per MAR -Continue Claritin 10 mg daily for seasonal allergies -Continue Losartan 50 mg daily for tachycardia -Continue Inderal 60 mg daily for hypertension -  Abstinence from substances encouraged  - SW to look into options for outpatient SA treatment at discharge  -Trazodone to 100 mg nightly PRN for insomnia -Lidoderm patch daily for lower back pain -Continue Mesalamine EC 4.8 g daily for  ulcerative colitis -Continue albuterol inhaler PRN for wheezing/SOB   Other PRNS -Continue Tylenol 650 mg every 6 hours PRN for mild pain -Continue Maalox 30 mg every 4 hrs PRN for indigestion -Continue Imodium 2-4 mg as needed for diarrhea -Continue Milk of Magnesia as needed every 6 hrs for constipation -Continue Zofran disintegrating tabs every 6 hrs PRN for nausea -Start Robaxin 500 mg PRN Q6H for muscle spasms (pt understands that she will not be discharged on this medication)    Discharge Planning: Social work and case management to assist with discharge planning and identification of hospital follow-up needs prior to discharge Estimated LOS: 5-7 days Discharge Concerns: Need to establish a safety plan; Medication compliance and effectiveness Discharge Goals: Return home with outpatient referrals for mental health follow-up including medication management/psychotherapy  Nicholes Rough, NP 09/12/2021, 5:17 PM  Patient ID: Tally Joe, female   DOB: November 12, 1980, 41 y.o.   MRN: [id number]

## 2021-09-12 NOTE — Group Note (Signed)
Recreation Therapy Group Note   Group Topic:Stress Management  Group Date: 09/12/2021 Start Time: 0940 End Time: 0953 Facilitators: Caroll Rancher, LRT,CTRS Location: [address] Dayroom   Goal Area(s) Addresses:  Patient will actively participate in stress management techniques presented during session.  Patient will successfully identify benefit of practicing stress management post d/c.   Group Description: Guided Imagery. LRT provided education, instruction, and demonstration on practice of visualization via guided imagery. Patient was asked to participate in the technique introduced during session. LRT debriefed including topics of mindfulness, stress management and specific scenarios each patient could use these techniques. Patients were given suggestions of ways to access scripts post d/c and encouraged to explore Youtube and other apps available on smartphones, tablets, and computers.   Affect/Mood: Appropriate   Participation Level: Engaged   Participation Quality: Independent   Behavior: Appropriate   Speech/Thought Process: Focused   Insight: Good   Judgement: Good   Modes of Intervention: Script, Soft Music   Patient Response to Interventions:  Engaged   Education Outcome:  Acknowledges education and In group clarification offered    Clinical Observations/Individualized Feedback: Pt attended and participated in group session.     Plan: Continue to engage patient in RT group sessions 2-3x/week.   Caroll Rancher, LRT,CTRS  09/12/2021 10:55 AM

## 2021-09-12 NOTE — BHH Group Notes (Signed)
Pt attended AA group 

## 2021-09-12 NOTE — Progress Notes (Signed)
Adult Psychoeducational Group Note  Date:  09/12/2021 Time:  1:12 PM  Group Topic/Focus:  Goals Group:   The focus of this group is to help patients establish daily goals to achieve during treatment and discuss how the patient can incorporate goal setting into their daily lives to aide in recovery.  Participation Level:  Active  Participation Quality:  Appropriate  Affect:  Appropriate  Cognitive:  Appropriate  Insight: Appropriate  Engagement in Group:  Engaged  Modes of Intervention:  Education  Additional Comments: Patient attended morning orientation/goal's group and said that her goal for today is to discuss her discharged plan.   Kaliope Quinonez W Lundon Rosier [id number], 1:12 PM

## 2021-09-12 NOTE — Group Note (Signed)
[facility] LCSW Group Therapy Note   Group Date: 09/12/2021 Start Time: 1300 End Time: 1400   Type of Therapy/Topic:  Group Therapy:  Emotion Regulation  Participation Level:  Active   Mood: Euthymic   Description of Group:    The purpose of this group is to assist patients in learning to regulate negative emotions and experience positive emotions. Patients will be guided to discuss ways in which they have been vulnerable to their negative emotions. These vulnerabilities will be juxtaposed with experiences of positive emotions or situations, and patients challenged to use positive emotions to combat negative ones. Special emphasis will be placed on coping with negative emotions in conflict situations, and patients will process healthy conflict resolution skills.  Therapeutic Goals: Patient will identify two positive emotions or experiences to reflect on in order to balance out negative emotions:  Patient will label two or more emotions that they find the most difficult to experience:  Patient will be able to demonstrate positive conflict resolution skills through discussion or role plays:   Summary of Patient Progress:   Patient was present for the entirety of the group session. Patient was an active listener and participated in the topic of discussion, provided helpful advice to others, and added nuance to topic of conversation. Patient shared she recently experienced the death of a friend which caused her to feel hopeless. Patient was able to share the emotions she felt, thoughts that came to mind, and behaviors.     Therapeutic Modalities:   Cognitive Behavioral Therapy Feelings Identification Dialectical Behavioral Therapy   Corky Crafts, [place]

## 2021-09-12 NOTE — Progress Notes (Signed)
D. Pt presented with improved mood, reporting, "I feel great!" Pt has been visible in the milieu observed interacting  well with peers, and attending groups. Per pt's self inventory, pt rated her depression, hopelessness and anxiety a 2/2/3, respectively. Pt does continue to endorse some chronic back pain, which the lidocaine patch has given some relief.  Pt currently denies SI/HI and AVH  A. Labs and vitals monitored. Pt given and educated on medications. Pt supported emotionally and encouraged to express concerns and ask questions.   R. Pt remains safe with 15 minute checks. Will continue POC.

## 2021-09-12 NOTE — Group Note (Signed)
Occupational Therapy Group Note  Group Topic:Communication  Group Date: 09/12/2021 Start Time: 1400 End Time: 1500 Facilitators: Ted Mcalpine, OT   Group Description: Group encouraged increased engagement and participation through discussion focused on communication styles. Patients were educated on the different styles of communication including passive, aggressive, assertive, and passive-aggressive communication. Group members shared and reflected on which styles they most often find themselves communicating in and brainstormed strategies on how to transition and practice a more assertive approach. Further discussion explored how to use assertiveness skills and strategies to further advocate and ask questions as it relates to their treatment plan and mental health.   Therapeutic Goal(s): Identify practical strategies to improve communication skills  Identify how to use assertive communication skills to address individual needs and wants   Participation Level: Active and Engaged   Participation Quality: Independent   Behavior: Appropriate   Speech/Thought Process: Directed   Affect/Mood: Appropriate   Insight: Good   Judgement: Fair   Individualization: pt was engaged in their participation of group discussion/activity. New skills were identified  Modes of Intervention: Discussion and Education  Patient Response to Interventions:  Attentive, Engaged, Interested , and Receptive   Plan: Continue to engage patient in OT groups 2 - 3x/week.  09/12/2021  Ted Mcalpine, OT  Jordan Foster, OT

## 2021-09-13 MED ORDER — ARIPIPRAZOLE 10 MG PO TABS: 10.0000 mg | ORAL_TABLET | Freq: Every day | ORAL | 0 refills | Status: DC

## 2021-09-13 MED ORDER — PRAZOSIN HCL 2 MG PO CAPS: 2.0000 mg | ORAL_CAPSULE | Freq: Every day | ORAL | 0 refills | Status: DC

## 2021-09-13 MED ORDER — DESVENLAFAXINE SUCCINATE ER 50 MG PO TB24: 50.0000 mg | ORAL_TABLET | Freq: Every day | ORAL | 0 refills | Status: DC

## 2021-09-13 MED ORDER — MELATONIN 5 MG PO TABS: 5.0000 mg | ORAL_TABLET | Freq: Every day | ORAL | 0 refills | Status: DC

## 2021-09-13 MED ORDER — ADULT MULTIVITAMIN W/MINERALS CH: 1.0000 | ORAL_TABLET | Freq: Every day | ORAL | Status: DC

## 2021-09-13 MED ORDER — PROPRANOLOL HCL ER 60 MG PO CP24: 60.0000 mg | ORAL_CAPSULE | Freq: Every day | ORAL | 0 refills | Status: AC

## 2021-09-13 MED ORDER — GABAPENTIN 300 MG PO CAPS: 300.0000 mg | ORAL_CAPSULE | Freq: Two times a day (BID) | ORAL | 0 refills | Status: DC

## 2021-09-13 MED ORDER — LOSARTAN POTASSIUM 50 MG PO TABS: 50.0000 mg | ORAL_TABLET | Freq: Every day | ORAL | 0 refills | Status: DC

## 2021-09-13 MED ORDER — OMEGA-3-ACID ETHYL ESTERS 1 G PO CAPS: 1.0000 g | ORAL_CAPSULE | Freq: Two times a day (BID) | ORAL | Status: DC

## 2021-09-13 MED ORDER — HYDROXYZINE HCL 25 MG PO TABS: 25.0000 mg | ORAL_TABLET | Freq: Three times a day (TID) | ORAL | 0 refills | Status: AC | PRN

## 2021-09-13 MED ORDER — TRAZODONE HCL 100 MG PO TABS
100.0000 mg | ORAL_TABLET | Freq: Every evening | ORAL | 0 refills | Status: DC | PRN
Start: 2021-09-13 — End: 2023-06-21

## 2021-09-13 MED ORDER — PSYLLIUM 95 % PO PACK: 1.0000 | PACK | Freq: Every day | ORAL | Status: DC

## 2021-09-13 MED ORDER — LIDOCAINE 5 % EX PTCH: 1.0000 | MEDICATED_PATCH | Freq: Every day | CUTANEOUS | 0 refills | Status: AC

## 2021-09-13 MED ORDER — GABAPENTIN 300 MG PO CAPS: 600.0000 mg | ORAL_CAPSULE | Freq: Every day | ORAL | 0 refills | Status: DC

## 2021-09-13 NOTE — BHH Group Notes (Signed)
Adult Orientation Group Note  Date:  09/13/2021 Time:  10:56 AM  Group Topic/Focus:  Orientation:   The focus of this group is to educate the patient on the purpose and policies of crisis stabilization and provide a format to answer questions about their admission.  The group details unit policies and expectations of patients while admitted.  Participation Level:  Active  Participation Quality:  Appropriate  Affect:  Appropriate  Cognitive:  Appropriate  Insight: Appropriate  Engagement in Group:  Engaged  Modes of Intervention:  Education  Kern Reap 09/13/2021, 10:56 AM

## 2021-09-13 NOTE — Progress Notes (Signed)
   09/13/21 0400  Psych Admission Type (Psych Patients Only)  Admission Status Voluntary  Psychosocial Assessment  Patient Complaints Anxiety  Eye Contact Fair  Facial Expression Animated  Affect Appropriate to circumstance  Speech Logical/coherent  Interaction Assertive  Motor Activity Other (Comment) (WDL)  Appearance/Hygiene Unremarkable  Behavior Characteristics Cooperative;Appropriate to situation  Mood Pleasant  Thought Process  Coherency WDL  Content WDL  Delusions None reported or observed  Perception WDL  Hallucination None reported or observed  Judgment Impaired  Confusion None  Danger to Self  Current suicidal ideation? Denies  Self-Injurious Behavior No self-injurious ideation or behavior indicators observed or expressed   Agreement Not to Harm Self Yes  Description of Agreement verbal  Danger to Others  Danger to Others None reported or observed

## 2021-09-13 NOTE — [facility] Suicide Risk Assessment (Cosign Needed)
Suicide Risk Assessment  Discharge Assessment    [facility] Discharge Suicide Risk Assessment   Principal Problem: MDD (major depressive disorder), recurrent episode (HCC) Discharge Diagnoses: Principal Problem:   MDD (major depressive disorder), recurrent episode (HCC) Active Problems:   Anxiety   Post-traumatic stress disorder, chronic   Alcohol abuse   Cocaine abuse (HCC)   Ulcerative colitis (HCC)  Reason For Admission:  Jordan Foster is a 41 y.o. Caucasian female with a history of PTSD, MDD, GAD & polysubstance abuse who presented to the [facility] urgent care [facility]) with complaints of worsening depressive symptoms and +SI. Pt denied having a plan this time, but reports a suicide attempt in 2018 via overdosing on 3 months supply of her medications & cocaine, leading to "flat-lining" for 7 minutes. For this admission, pt reports that she felt the onset of symptoms that she experienced prior to her overdose in 2018, and was transferred to this [facility] for treatment and stabilization of her mood.                                 HOSPITAL COURSE During the patient's hospitalization, patient had extensive initial psychiatric evaluation, and follow-up psychiatric evaluations every day.  Patient's medications were adjusted on admission as follows:                        -Continue Hydroxyzine 25 mg every 6 hours PRN -Discontinued Effexor d/t h/o hypertension -Started Prestiq 50 mg daily for MDD -Started Gabapentin 200 mg TID for anxiety/ETOH use d/o -Started ETOH Ativan Detox protocol  - Oral thiamine and MVI replacement  -Continued Claritin 10 mg daily for seasonal allergies -Continued Losartan 50 mg daily for tachycardia -Continued Inderal 60 mg daily for hypertension -Started Trazodone 50 mg nightly PRN for insomnia -Continued Mesalamine EC 4.8 g daily for  ulcerative colitis -Continued albuterol inhaler PRN for wheezing/SOB  During the  hospitalization, other adjustments were made to the patient's medication regimen, with medications at discharge being as follows:  -Continue Hydroxyzine 25 mg every 6 hours PRN -Continue Pristiq 50 mg daily for MDD -Continue Gabapentin 300mg  PO with breakfast and lunch and 600mg  PO QHS for anxiety/ETOH use d/o -Continue Claritin 10 mg daily for seasonal allergies -Continue Losartan 50 mg daily for tachycardia -Continue Inderal 60 mg daily for hypertension -Lidoderm patch daily for lower back pain -Continue Mesalamine EC 4.8 g daily for  ulcerative colitis -Continue albuterol inhaler PRN for wheezing/SOB  Patient's care was discussed during the interdisciplinary team meeting every day during the hospitalization.  The patient denies having side effects to prescribed psychiatric medication. The patient was evaluated each day by a clinical provider to ascertain response to treatment. Improvement was noted by the patient's report of decreasing symptoms, improved sleep and appetite, affect, medication tolerance, behavior, and participation in unit programming.  Patient was asked each day to complete a self inventory noting mood, mental status, pain, new symptoms, anxiety and concerns.    Symptoms were reported as significantly decreased or resolved completely by discharge. On day of discharge, the patient reports that their mood is stable. The patient denied having suicidal thoughts for more than 48 hours prior to discharge.  Patient denies having homicidal thoughts.  Patient denies having auditory hallucinations.  Patient denies any visual hallucinations or other symptoms of psychosis. The patient is motivated to continue taking medication with a  goal of continued improvement in mental health.   The patient reports their target psychiatric symptoms of depression & anxiety responded well to the psychiatric medications, and the patient reports overall benefit from this psychiatric hospitalization. Supportive  psychotherapy was provided to the patient. The patient also participated in regular group therapy while hospitalized. Coping skills, problem solving as well as relaxation therapies were also part of the unit programming.  Total Time spent with patient: 30 minutes  Musculoskeletal: Strength & Muscle Tone: within normal limits Gait & Station: normal Patient leans: N/A  Psychiatric Specialty Exam  Presentation  General Appearance: Appropriate for Environment  Eye Contact:Good  Speech:Clear and Coherent  Speech Volume:Normal  Handedness:Right  Mood and Affect  Mood:Euthymic  Duration of Depression Symptoms: Less than two weeks  Affect:Appropriate  Thought Process  Thought Processes:Coherent  Descriptions of Associations:Intact  Orientation:Full (Time, Place and Person)  Thought Content:Logical  History of Schizophrenia/Schizoaffective disorder:No  Duration of Psychotic Symptoms:No data recorded Hallucinations:Hallucinations: None  Ideas of Reference:None  Suicidal Thoughts:Suicidal Thoughts: No  Homicidal Thoughts:Homicidal Thoughts: No  Sensorium  Memory:Immediate Good; Recent Good  Judgment:Good  Insight:Good  Executive Functions  Concentration:Good  Attention Span:Good  Recall:Good  Fund of Knowledge:Good  Language:Good  Psychomotor Activity  Psychomotor Activity:Psychomotor Activity: Normal  Assets  Assets:Communication Skills  Sleep  Sleep:Sleep: Good  Physical Exam: Physical Exam Constitutional:      Appearance: Normal appearance.  HENT:     Head: Normocephalic.     Nose: Nose normal. No congestion or rhinorrhea.  Eyes:     Pupils: Pupils are equal, round, and reactive to light.  Pulmonary:     Effort: Pulmonary effort is normal. No respiratory distress.  Musculoskeletal:        General: Normal range of motion.     Cervical back: Normal range of motion.  Neurological:     General: No focal deficit present.     Mental  Status: She is alert and oriented to person, place, and time.     Sensory: No sensory deficit.     Coordination: Coordination normal.  Psychiatric:        Behavior: Behavior normal.        Thought Content: Thought content normal.   Review of Systems  Constitutional: Negative.  Negative for fever.  HENT: Negative.  Negative for sore throat.   Eyes: Negative.   Respiratory: Negative.  Negative for cough.   Cardiovascular: Negative.  Negative for chest pain.  Gastrointestinal: Negative.  Negative for heartburn.  Genitourinary: Negative.   Musculoskeletal: Negative.   Skin: Negative.   Neurological: Negative.   Psychiatric/Behavioral:  Positive for depression (Pt reports that her depressive symptoms are improving. She denies SI/HI/AVH and is verbally contracting for safety outside of this [facility]) and substance abuse (Educated on substance use cessation). Negative for hallucinations and suicidal ideas. Nervous/anxious: anxiety is resolving on current medications. Insomnia: Insomnia has resolved on current medications.   Blood pressure 107/86, pulse 92, temperature 97.8 F (36.6 C), temperature source Oral, resp. rate 16, height 5\' 7"  (1.702 m), weight 68.2 kg, SpO2 100 %. Body mass index is 23.56 kg/m.  Mental Status Per Nursing Assessment::   On Admission:  NA  Demographic Factors:  Caucasian and Gay, lesbian, or bisexual orientation  Loss Factors: NA  Historical Factors: Prior suicide attempts  Risk Reduction Factors:   Sense of responsibility to family, Living with another person, especially a relative, and Positive social support  Continued Clinical Symptoms:  Patient reports that her depressive  symptoms have improved. She denies SI/HI/AVH and is verbally contracting for safety outside of this [facility].  Cognitive Features That Contribute To Risk:  None    Suicide Risk:  Minimal: No identifiable suicidal ideation.  Patients presenting with no risk factors but with morbid  ruminations; may be classified as minimal risk based on the severity of the depressive symptoms   Follow-up Information     [facility] Go on 09/30/2021.   Why: You have an appointment with this provider ([facility]) for medication management on 09/30/21 at 4:30 pm.  This appointment will be held in person.  There is currently a wait list for therapy appointments. Contact information: [address] [place] [address] [phone]         [facility] Follow up on 09/21/2021.   Why: You have an appointment for therapy services on 09/21/21 at 10:00 am.  This will be a Virtual appointment. Contact information: [address] [place], [place] [address] [phone]                Plan Of Care/Follow-up recommendations:   Labs were reviewed with the patient, and abnormal results were discussed with the patient.  The patient is able to verbalize their individual safety plan to this provider.  # It is recommended to the patient to continue psychiatric medications as prescribed, after discharge from the hospital.    # It is recommended to the patient to follow up with your outpatient psychiatric provider and PCP.  # It was discussed with the patient, the impact of alcohol, drugs, tobacco have been there overall psychiatric and medical wellbeing, and total abstinence from substance use was recommended the patient.ed.  # Prescriptions provided or sent directly to preferred pharmacy at discharge. Patient agreeable to plan. Given opportunity to ask questions. Appears to feel comfortable with discharge.    # In the event of worsening symptoms, the patient is instructed to call the crisis hotline (988), 911 and or go to the nearest ED for appropriate evaluation and treatment of symptoms. To follow-up with primary care provider for other medical issues, concerns and or health care needs  # Patient was discharged  home with a plan to follow up as noted above.   Starleen Blue, NP 09/13/2021, 9:07 AM

## 2021-09-13 NOTE — Progress Notes (Signed)
RN met with pt and reviewed pt's discharge instructions.  Pt verbalized understanding of discharge instructions and pt did not have any questions. RN reviewed and provided pt with a copy of SRA, AVS and Transition Record.  RN returned pt's belongings to pt.  Pt denied SI/HI/AVH and voiced no concerns.  Pt was appreciative of the care pt received at [facility].  Patient discharged to the lobby without incident. 

## 2021-09-13 NOTE — Progress Notes (Signed)
Patient's self inventory sheet, patient sleeps good, sleep medication helpful.  Good appetite, normal energy, good concentration.  Rated depression, hopeless and anxiety 2.  Denied withdrawals.  Denied SI.  Physical pain, back, neck, worst pain #6 in past 24 hours.  Goal is discharge.  It's been very helpful and I feel better than I have in long time.

## 2021-09-13 NOTE — Progress Notes (Signed)
  [facility] Adult Case Management Discharge Plan :  Will you be returning to the same living situation after discharge:  Yes,  Home with girlfriend  At discharge, do you have transportation home?: Yes,  Own car at [facility] Do you have the ability to pay for your medications: Yes,  Insurance/BCBS  Release of information consent forms completed and in the chart;  Patient's signature needed at discharge.  Patient to Follow up at:  Follow-up Information     [facility] Go on 09/30/2021.   Why: You have an appointment with this provider ([facility]) for medication management on 09/30/21 at 4:30 pm.  This appointment will be held in person.  There is currently a wait list for therapy appointments. Contact information: [address] [place] [address] [phone]         [facility] Follow up on 09/21/2021.   Why: You have an appointment for therapy services on 09/21/21 at 10:00 am.  This will be a Virtual appointment. Contact information: [address] [place], [place] [address] [phone]                 Next level of care provider has access to [facility] Link:no  Safety Planning and Suicide Prevention discussed: Yes,  with patient and girlfriend      Has patient been referred to the Quitline?: N/A patient is not a smoker  Patient has been referred for addiction treatment: Pt. refused referral Pt asked for therapy and medication management services only.   Aram Beecham, LCSWA 09/13/2021, 9:19 AM

## 2021-09-14 NOTE — Discharge Summary (Addendum)
Physician Discharge Summary Note  Patient:  Jordan Foster is an 41 y.o., female MRN:  [id number] DOB:  07-Nov-1980 Patient phone:  [phone] (home)  Patient address:   [address] [place] [address],  Total Time spent with patient: 30 minutes  Date of Admission:  09/06/2021 Date of Discharge: 09/13/2021  Reason for Admission:  Jordan Foster is a 41 y.o. Caucasian female with a history of PTSD, MDD, GAD & polysubstance abuse who presented to the [facility] health urgent care [facility]) with complaints of worsening depressive symptoms and +SI. Pt denied having a plan this time, but reports a suicide attempt in 2018 via overdosing on 3 months supply of her medications & cocaine, leading to "flat-lining" for 7 minutes. For this admission, pt reports that she felt the onset of symptoms that she experienced prior to her overdose in 2018, and was transferred to this [facility] for treatment and stabilization of her mood.                                       HOSPITAL COURSE During the patient's hospitalization, patient had extensive initial psychiatric evaluation, and follow-up psychiatric evaluations every day.  Patient's medications were adjusted on admission as follows:                        -Continue Hydroxyzine 25 mg every 6 hours PRN -Discontinued Effexor d/t h/o hypertension -Started Prestiq 50 mg daily for MDD -Started Gabapentin 200 mg TID for anxiety/ETOH use d/o -Started ETOH Ativan Detox protocol  - Oral thiamine and MVI replacement  -Continued Claritin 10 mg daily for seasonal allergies -Continued Losartan 50 mg daily for tachycardia -Continued Inderal 60 mg daily for hypertension -Started Trazodone 50 mg nightly PRN for insomnia -Continued Mesalamine EC 4.8 g daily for  ulcerative colitis -Continued albuterol inhaler PRN for wheezing/SOB   During the hospitalization, other adjustments were made to the patient's  medication regimen, with medications at discharge being as follows:  -Continue Hydroxyzine 25 mg every 6 hours PRN -Continue Pristiq 50 mg daily for MDD -Continue Gabapentin 300mg  PO with breakfast and lunch and 600mg  PO QHS for anxiety/ETOH use d/o -Continue Claritin 10 mg daily for seasonal allergies -Continue Losartan 50 mg daily for tachycardia -Continue Inderal 60 mg daily for hypertension -Lidoderm patch daily for lower back pain -Continue Mesalamine EC 4.8 g daily for  ulcerative colitis -Continue albuterol inhaler PRN for wheezing/SOB   Patient's care was discussed during the interdisciplinary team meeting every day during the hospitalization.  The patient denies having side effects to prescribed psychiatric medication. The patient was evaluated each day by a clinical provider to ascertain response to treatment. Improvement was noted by the patient's report of decreasing symptoms, improved sleep and appetite, affect, medication tolerance, behavior, and participation in unit programming.  Patient was asked each day to complete a self inventory noting mood, mental status, pain, new symptoms, anxiety and concerns.     Symptoms were reported as significantly decreased or resolved completely by discharge. On day of discharge, the patient reports that their mood is stable. The patient denied having suicidal thoughts for more than 48 hours prior to discharge.  Patient denies having homicidal thoughts.  Patient denies having auditory hallucinations.  Patient denies any visual hallucinations or other symptoms of psychosis. The patient is motivated to continue taking medication with  a goal of continued improvement in mental health.    The patient reports their target psychiatric symptoms of depression & anxiety responded well to the psychiatric medications, and the patient reports overall benefit from this psychiatric hospitalization. Supportive psychotherapy was provided to the patient. The patient  also participated in regular group therapy while hospitalized. Coping skills, problem solving as well as relaxation therapies were also part of the unit programming.   Total Time spent with patient: 30 minutes   Principal Problem: MDD (major depressive disorder), recurrent episode ([place]) Discharge Diagnoses: Principal Problem:   MDD (major depressive disorder), recurrent episode ([place]) Active Problems:   Anxiety   Post-traumatic stress disorder, chronic   Alcohol abuse   Cocaine abuse ([place])   Ulcerative colitis ([place])  Past Psychiatric History: As above  Past Medical History:  Past Medical History:  Diagnosis Date   Asthma    Depression    Hypertension    Positive TB test     Past Surgical History:  Procedure Laterality Date   LASIK     NASAL SINUS SURGERY     Family History:  Family History  Problem Relation Age of Onset   Hypertension Mother    Family Psychiatric  History: none reported  Social History:  Social History   Substance and Sexual Activity  Alcohol Use Yes   Alcohol/week: 35.0 standard drinks   Types: 35 Shots of liquor per week     Social History   Substance and Sexual Activity  Drug Use Yes   Frequency: 3.0 times per week   Types: Marijuana, Cocaine    Social History   Socioeconomic History   Marital status: Single    Spouse name: Not on file   Number of children: 0   Years of education: 16   Highest education level: Not on file  Occupational History   Occupation: [employer]   Tobacco Use   Smoking status: Never   Smokeless tobacco: Never  Vaping Use   Vaping Use: Never used  Substance and Sexual Activity   Alcohol use: Yes    Alcohol/week: 35.0 standard drinks    Types: 35 Shots of liquor per week   Drug use: Yes    Frequency: 3.0 times per week    Types: Marijuana, Cocaine   Sexual activity: Yes    Comment: W/ Female Partner  Other Topics Concern   Not on file  Social History Narrative   Fun/Hobby: Working to determine    Social Determinants of Radio broadcast assistant Strain: Not on file  Food Insecurity: Not on file  Transportation Needs: Not on file  Physical Activity: Not on file  Stress: Not on file  Social Connections: Not on file   Physical Findings: AIMS: 0 CIWA:  CIWA-Ar Total: 0 COWS:  n/a  Musculoskeletal: Strength & Muscle Tone: within normal limits Gait & Station: normal Patient leans: N/A  Psychiatric Specialty Exam:  Presentation  General Appearance: Casual; Well Groomed  Eye Contact:Good  Speech:Clear and Coherent; Normal Rate  Speech Volume:Normal  Handedness:Right  Mood and Affect  Mood:Euthymic  Affect:Appropriate; Full Range  Thought Process  Thought Processes:Linear  Descriptions of Associations:Intact  Orientation:Full (Time, Place and Person)  Thought Content:Logical  History of Schizophrenia/Schizoaffective disorder:No  Duration of Psychotic Symptoms:No data recorded Hallucinations:Hallucinations: None  Ideas of Reference:None  Suicidal Thoughts:Suicidal Thoughts: No  Homicidal Thoughts:Homicidal Thoughts: No  Sensorium  Memory:Immediate Good; Recent Good; Remote Good  Judgment:Good  Insight:Good  Executive Functions  Concentration:Good  Attention  Span:Good  Recall:Good  Fund of Knowledge:Good  Language:Good  Psychomotor Activity  Psychomotor Activity:Psychomotor Activity: Normal  Assets  Assets:Communication Skills; Housing; Social Support  Sleep  Sleep:Sleep: Good  Physical Exam: Physical Exam Constitutional:      Appearance: Normal appearance.  HENT:     Head: Normocephalic.     Nose: Nose normal. No congestion or rhinorrhea.  Eyes:     Pupils: Pupils are equal, round, and reactive to light.  Pulmonary:     Effort: Pulmonary effort is normal.  Musculoskeletal:        General: Normal range of motion.     Cervical back: Normal range of motion.  Neurological:     Mental Status: She is alert and oriented to  person, place, and time.  Psychiatric:        Behavior: Behavior normal.   Review of Systems  Constitutional: Negative.  Negative for fever.  HENT: Negative.    Eyes: Negative.   Respiratory: Negative.    Cardiovascular: Negative.   Gastrointestinal: Negative.   Genitourinary: Negative.   Musculoskeletal: Negative.   Skin: Negative.   Psychiatric/Behavioral:  Positive for depression (Pt reports that depressive symptoms are resolving. She denies SI/HI/AVH and verbally contracts for safety outside of this [facility]) and substance abuse. Negative for hallucinations, memory loss and suicidal ideas. Nervous/anxious: Anxiety is resolving with medications. Insomnia: resolving with medications.   Blood pressure 107/86, pulse 92, temperature 97.8 F (36.6 C), temperature source Oral, resp. rate 16, height 5\' 7"  (1.702 m), weight 68.2 kg, SpO2 100 %. Body mass index is 23.56 kg/m.  Social History   Tobacco Use  Smoking Status Never  Smokeless Tobacco Never   Tobacco Cessation:  N/A, patient does not currently use tobacco products   Blood Alcohol level:  Lab Results  Component Value Date   ETH 73 (H) 123456    Metabolic Disorder Labs:  Lab Results  Component Value Date   HGBA1C 5.2 09/03/2021   MPG 102.54 09/03/2021   No results found for: PROLACTIN Lab Results  Component Value Date   CHOL 230 (H) 09/03/2021   TRIG 77 09/03/2021   HDL 130 09/03/2021   CHOLHDL 1.8 09/03/2021   VLDL 15 09/03/2021   [place] 85 09/03/2021   See Psychiatric Specialty Exam and Suicide Risk Assessment completed by Attending Physician prior to discharge.  Discharge destination:  Home  Is patient on multiple antipsychotic therapies at discharge:  No   Has Patient had three or more failed trials of antipsychotic monotherapy by history:  No  Recommended Plan for Multiple Antipsychotic Therapies: NA  Discharge Instructions     Diet - low sodium heart healthy   Complete by: As directed     Increase activity slowly   Complete by: As directed       Allergies as of 09/13/2021       Reactions   Latex Itching        Medication List     STOP taking these medications    venlafaxine XR 37.5 MG 24 hr capsule Commonly known as: EFFEXOR-XR       TAKE these medications      Indication  albuterol 108 (90 Base) MCG/ACT inhaler Commonly known as: VENTOLIN HFA Inhale 2 puffs into the lungs every 4 (four) hours as needed.  Indication: Asthma   ARIPiprazole 10 MG tablet Commonly known as: ABILIFY Take 1 tablet (10 mg total) by mouth daily. What changed:  medication strength how much to take  Indication: Major  Depressive Disorder   budesonide-formoterol 160-4.5 MCG/ACT inhaler Commonly known as: SYMBICORT Inhale 2 puffs into the lungs 2 (two) times daily.  Indication: Asthma   carboxymethylcellulose 0.5 % Soln Commonly known as: REFRESH PLUS Place 1 drop into both eyes 3 (three) times daily as needed.  Indication: Irritation of the Eye   cetirizine 10 MG tablet Commonly known as: ZYRTEC Take 10 mg by mouth daily.  Indication: Hayfever   desvenlafaxine 50 MG 24 hr tablet Commonly known as: PRISTIQ Take 1 tablet (50 mg total) by mouth daily.  Indication: Major Depressive Disorder   gabapentin 300 MG capsule Commonly known as: NEURONTIN Take 1 capsule (300 mg total) by mouth 2 (two) times daily with breakfast and lunch.  Indication: Abuse or Misuse of Alcohol   gabapentin 300 MG capsule Commonly known as: NEURONTIN Take 2 capsules (600 mg total) by mouth at bedtime.  Indication: Abuse or Misuse of Alcohol   hydrOXYzine 25 MG tablet Commonly known as: ATARAX Take 1 tablet (25 mg total) by mouth 3 (three) times daily as needed for anxiety.  Indication: Feeling Anxious   lidocaine 5 % Commonly known as: LIDODERM Place 1 patch onto the skin daily. Remove & Discard patch within 12 hours or as directed by MD  Indication: pain   losartan 50 MG  tablet Commonly known as: COZAAR Take 1 tablet (50 mg total) by mouth daily.  Indication: High Blood Pressure Disorder   melatonin 5 MG Tabs Take 1 tablet (5 mg total) by mouth at bedtime.  Indication: Trouble Sleeping   mesalamine 1.2 g EC tablet Commonly known as: LIALDA Take 4.8 g by mouth daily.  Indication: bowel   multivitamin with minerals Tabs tablet Take 1 tablet by mouth daily.  Indication: mental health   omega-3 acid ethyl esters 1 g capsule Commonly known as: LOVAZA Take 1 capsule (1 g total) by mouth 2 (two) times daily.  Indication: High Amount of Triglycerides in the Blood   ondansetron 4 MG tablet Commonly known as: ZOFRAN Take 1 tablet by mouth daily.  Indication: Nausea and Vomiting   pantoprazole 40 MG tablet Commonly known as: PROTONIX Take 40 mg by mouth daily.  Indication: Heartburn   prazosin 2 MG capsule Commonly known as: MINIPRESS Take 1 capsule (2 mg total) by mouth at bedtime.  Indication: Frightening Dreams   propranolol ER 60 MG 24 hr capsule Commonly known as: INDERAL LA Take 1 capsule (60 mg total) by mouth daily.  Indication: High Blood Pressure Disorder   psyllium 95 % Pack Commonly known as: HYDROCIL/METAMUCIL Take 1 packet by mouth daily.  Indication: ulcerative colitis   traZODone 100 MG tablet Commonly known as: DESYREL Take 1 tablet (100 mg total) by mouth at bedtime as needed for sleep. What changed:  medication strength how much to take  Indication: Trouble Sleeping, Major Depressive Disorder        Follow-up [facility] Go on 09/30/2021.   Why: You have an appointment with this provider ([facility]) for medication management on 09/30/21 at 4:30 pm.  This appointment will be held in person.  There is currently a wait list for therapy appointments. Contact information: [place] [address] [place]  Follow up on 09/21/2021.   Why: You have an appointment for therapy services on 09/21/21 at 10:00 am.  This will be a Virtual appointment. Contact  information: [place], [place], [place] [address] [phone]                Follow-up recommendations:   Labs were reviewed with the patient, and abnormal results were discussed with the patient.   The patient is able to verbalize their individual safety plan to this provider.   # It is recommended to the patient to continue psychiatric medications as prescribed, after discharge from the hospital.     # It is recommended to the patient to follow up with your outpatient psychiatric provider and PCP.   # It was discussed with the patient, the impact of alcohol, drugs, tobacco have been there overall psychiatric and medical wellbeing, and total abstinence from substance use was recommended the patient.ed.   # Prescriptions provided or sent directly to preferred pharmacy at discharge. Patient agreeable to plan. Given opportunity to ask questions. Appears to feel comfortable with discharge.    # In the event of worsening symptoms, the patient is instructed to call the crisis hotline (988), 911 and or go to the nearest ED for appropriate evaluation and treatment of symptoms. To follow-up with primary care provider for other medical issues, concerns and or health care needs   # Patient was discharged home with a plan to follow up as noted above.   Signed: Nicholes Rough, NP 09/14/2021, 10:53 AM

## 2021-09-27 ENCOUNTER — Telehealth (HOSPITAL_COMMUNITY): Payer: Self-pay | Admitting: Nurse Practitioner

## 2021-09-27 NOTE — BH Assessment (Signed)
Care Management - Follow Up [facility] Discharges   Patient has been placed in an inpatient psychiatric hospital ([place]) on  09-03-2021

## 2023-06-20 ENCOUNTER — Encounter (HOSPITAL_COMMUNITY): Payer: Self-pay

## 2023-06-20 ENCOUNTER — Emergency Department (HOSPITAL_COMMUNITY)
Admission: EM | Admit: 2023-06-20 | Discharge: 2023-06-21 | Disposition: A | Discharge status: Other Acute Inpatient Hospital | Payer: No Typology Code available for payment source | Attending: Emergency Medicine | Admitting: Emergency Medicine

## 2023-06-20 ENCOUNTER — Other Ambulatory Visit: Payer: Self-pay

## 2023-06-20 DIAGNOSIS — Z91040 Latex allergy status: Secondary | ICD-10-CM | POA: Insufficient documentation

## 2023-06-20 DIAGNOSIS — F329 Major depressive disorder, single episode, unspecified: Secondary | ICD-10-CM | POA: Insufficient documentation

## 2023-06-20 DIAGNOSIS — Y908 Blood alcohol level of 240 mg/100 ml or more: Secondary | ICD-10-CM | POA: Insufficient documentation

## 2023-06-20 DIAGNOSIS — T424X2A Poisoning by benzodiazepines, intentional self-harm, initial encounter: Secondary | ICD-10-CM | POA: Diagnosis not present

## 2023-06-20 DIAGNOSIS — F10129 Alcohol abuse with intoxication, unspecified: Secondary | ICD-10-CM | POA: Insufficient documentation

## 2023-06-20 DIAGNOSIS — T426X2A Poisoning by other antiepileptic and sedative-hypnotic drugs, intentional self-harm, initial encounter: Secondary | ICD-10-CM | POA: Insufficient documentation

## 2023-06-20 DIAGNOSIS — J45909 Unspecified asthma, uncomplicated: Secondary | ICD-10-CM | POA: Diagnosis not present

## 2023-06-20 DIAGNOSIS — F4310 Post-traumatic stress disorder, unspecified: Secondary | ICD-10-CM | POA: Diagnosis not present

## 2023-06-20 DIAGNOSIS — R7401 Elevation of levels of liver transaminase levels: Secondary | ICD-10-CM | POA: Diagnosis not present

## 2023-06-20 DIAGNOSIS — I10 Essential (primary) hypertension: Secondary | ICD-10-CM | POA: Diagnosis not present

## 2023-06-20 DIAGNOSIS — T1491XA Suicide attempt, initial encounter: Secondary | ICD-10-CM

## 2023-06-20 LAB — COMPREHENSIVE METABOLIC PANEL
ALT: 61 U/L — ABNORMAL HIGH (ref 0–44)
AST: 96 U/L — ABNORMAL HIGH (ref 15–41)
Albumin: 4 g/dL (ref 3.5–5.0)
Alkaline Phosphatase: 68 U/L (ref 38–126)
Anion gap: 12 (ref 5–15)
BUN: 18 mg/dL (ref 6–20)
CO2: 24 mmol/L (ref 22–32)
Calcium: 8.9 mg/dL (ref 8.9–10.3)
Chloride: 104 mmol/L (ref 98–111)
Creatinine, Ser: 0.89 mg/dL (ref 0.44–1.00)
GFR, Estimated: >60 mL/min (ref >60)
Glucose, Bld: 90 mg/dL (ref 70–99)
Potassium: 3.7 mmol/L (ref 3.5–5.1)
Sodium: 140 mmol/L (ref 135–145)
Total Bilirubin: 0.6 mg/dL (ref 0.0–1.2)
Total Protein: 7.7 g/dL (ref 6.5–8.1)

## 2023-06-20 LAB — CBC
HCT: 37.3 % (ref 36.0–46.0)
Hemoglobin: 12.1 g/dL (ref 12.0–15.0)
MCH: 30.5 pg (ref 26.0–34.0)
MCHC: 32.4 g/dL (ref 30.0–36.0)
MCV: 94 fL (ref 80.0–100.0)
Platelets: 176 10*3/uL (ref 150–400)
RBC: 3.97 MIL/uL (ref 3.87–5.11)
RDW: 15 % (ref 11.5–15.5)
WBC: 8 10*3/uL (ref 4.0–10.5)
nRBC: 0 % (ref 0.0–0.2)

## 2023-06-20 LAB — ETHANOL: Alcohol, Ethyl (B): 330 mg/dL (ref <10)

## 2023-06-20 LAB — ACETAMINOPHEN LEVEL: Acetaminophen (Tylenol), Serum: <10 ug/mL — ABNORMAL LOW (ref 10–30)

## 2023-06-20 LAB — HCG, SERUM, QUALITATIVE: Preg, Serum: NEGATIVE

## 2023-06-20 LAB — SALICYLATE LEVEL: Salicylate Lvl: <7 mg/dL — ABNORMAL LOW (ref 7.0–30.0)

## 2023-06-20 MED ORDER — LACTATED RINGERS IV BOLUS
1000.0000 mL | Freq: Once | INTRAVENOUS | Status: AC
  Administered 2023-06-20: 1000 mL via INTRAVENOUS

## 2023-06-20 MED ORDER — SODIUM CHLORIDE 0.9 % IV BOLUS
1000.0000 mL | Freq: Once | INTRAVENOUS | Status: AC
  Administered 2023-06-20: 1000 mL via INTRAVENOUS

## 2023-06-20 NOTE — ED Triage Notes (Signed)
 BIB EMS from home for SI. Denies HI.  Pt girlfriend broke up with her today causing this.  Took 6, 600mg  gabapentin tabs and 3, 300mg  gabapentin tabs, 1.5mg  of xanax, and 2 shots of tequila PTA. Pt answers all questions appropriately, but is lethargic.

## 2023-06-20 NOTE — ED Notes (Signed)
 Pt currently not awake and alert enough to dress out, will dress out when pt is more awake/alert.

## 2023-06-20 NOTE — ED Notes (Addendum)
 POISON CONTROL Consult: Monitor for respiratory depression N/V/D Baseline EKG Cardiac Monitoring 4 hour post tylenol Obs 6 hours Per Angelique Blonder from [facility]

## 2023-06-20 NOTE — ED Provider Notes (Signed)
 [facility] EMERGENCY DEPARTMENT AT [facility] Provider Note   CSN: [id number] Arrival date & time: 06/20/23  1912     History  Chief Complaint  Patient presents with   Suicide Attempt    Jordan Foster is a 43 y.o. female with medical history of asthma, depression, hypertension.  Patient presents to ED for evaluation of SI.  States that she took 3600 mg gabapentin, 3 mg of Xanax, 4 shots of tequila earlier tonight in an attempt to end her life.  Reports that she did this because her girlfriend recently left her today.  Reports multiple instances of suicide attempts in the past.  Reports being psychiatrically hospitalized in the past.  Reports access to firearms.  Denies HI, AVH.  Denies medical complaints.  IVC paperwork filled out.  HPI     Home Medications Prior to Admission medications   Medication Sig Start Date End Date Taking? Authorizing Provider  albuterol (VENTOLIN HFA) 108 (90 Base) MCG/ACT inhaler Inhale 2 puffs into the lungs every 4 (four) hours as needed. 04/20/19 11/12/21  [provider]  ARIPiprazole (ABILIFY) 10 MG tablet Take 1 tablet (10 mg total) by mouth daily. 09/14/21 10/14/21  Massengill, Harrold Donath, MD  budesonide-formoterol (SYMBICORT) 160-4.5 MCG/ACT inhaler Inhale 2 puffs into the lungs 2 (two) times daily. 11/12/20 11/12/21  [provider]  carboxymethylcellulose (REFRESH PLUS) 0.5 % SOLN Place 1 drop into both eyes 3 (three) times daily as needed.    [provider]  cetirizine (ZYRTEC) 10 MG tablet Take 10 mg by mouth daily. 10/19/16   [provider]  desvenlafaxine (PRISTIQ) 50 MG 24 hr tablet Take 1 tablet (50 mg total) by mouth daily. 09/14/21 10/14/21  Massengill, Harrold Donath, MD  gabapentin (NEURONTIN) 300 MG capsule Take 1 capsule (300 mg total) by mouth 2 (two) times daily with breakfast and lunch. 09/13/21 10/13/21  Massengill, Harrold Donath, MD  gabapentin (NEURONTIN) 300 MG capsule Take 2 capsules (600 mg total) by  mouth at bedtime. 09/13/21 10/13/21  Massengill, Harrold Donath, MD  losartan (COZAAR) 50 MG tablet Take 1 tablet (50 mg total) by mouth daily. 09/13/21 10/13/21  Massengill, Harrold Donath, MD  melatonin 5 MG TABS Take 1 tablet (5 mg total) by mouth at bedtime. 09/13/21   Massengill, Harrold Donath, MD  mesalamine (LIALDA) 1.2 g EC tablet Take 4.8 g by mouth daily. 07/18/21   [provider]  Multiple Vitamin (MULTIVITAMIN WITH MINERALS) TABS tablet Take 1 tablet by mouth daily. 09/14/21   Massengill, Harrold Donath, MD  omega-3 acid ethyl esters (LOVAZA) 1 g capsule Take 1 capsule (1 g total) by mouth 2 (two) times daily. 09/13/21   Massengill, Harrold Donath, MD  ondansetron (ZOFRAN) 4 MG tablet Take 1 tablet by mouth daily. 04/05/21   [provider]  pantoprazole (PROTONIX) 40 MG tablet Take 40 mg by mouth daily. 08/04/21   [provider]  prazosin (MINIPRESS) 2 MG capsule Take 1 capsule (2 mg total) by mouth at bedtime. 09/13/21 10/13/21  Massengill, Harrold Donath, MD  propranolol ER (INDERAL LA) 60 MG 24 hr capsule Take 1 capsule (60 mg total) by mouth daily. 09/13/21 10/13/21  Massengill, Harrold Donath, MD  psyllium (HYDROCIL/METAMUCIL) 95 % PACK Take 1 packet by mouth daily. 09/13/21   Massengill, Harrold Donath, MD  traZODone (DESYREL) 100 MG tablet Take 1 tablet (100 mg total) by mouth at bedtime as needed for sleep. 09/13/21 10/13/21  Phineas Inches, MD      Allergies    Latex    Review of Systems  Review of Systems  Unable to perform ROS: Acuity of condition (Level 5 caveat)  All other systems reviewed and are negative.   Physical Exam Updated Vital Signs BP (!) 100/59   Pulse 89   Temp 98.4 F (36.9 C) (Oral)   Resp 11   Ht 5\' 7"  (1.702 m)   Wt 72.6 kg   SpO2 100%   BMI 25.06 kg/m  Physical Exam Vitals and nursing note reviewed.  Constitutional:      General: She is not in acute distress.    Appearance: She is well-developed.  HENT:     Head: Normocephalic and atraumatic.  Eyes:     Conjunctiva/sclera:  Conjunctivae normal.  Cardiovascular:     Rate and Rhythm: Normal rate and regular rhythm.     Heart sounds: No murmur heard. Pulmonary:     Effort: Pulmonary effort is normal. No respiratory distress.     Breath sounds: Normal breath sounds.  Abdominal:     Palpations: Abdomen is soft.     Tenderness: There is no abdominal tenderness.  Musculoskeletal:        General: No swelling.     Cervical back: Neck supple.  Skin:    General: Skin is warm and dry.     Capillary Refill: Capillary refill takes less than 2 seconds.  Neurological:     Mental Status: She is alert.  Psychiatric:        Mood and Affect: Mood normal.     ED Results / Procedures / Treatments   Labs (all labs ordered are listed, but only abnormal results are displayed) Labs Reviewed  COMPREHENSIVE METABOLIC PANEL - Abnormal; Notable for the following components:      Result Value   AST 96 (*)    ALT 61 (*)    All other components within normal limits  ETHANOL - Abnormal; Notable for the following components:   Alcohol, Ethyl (B) 330 (*)    All other components within normal limits  SALICYLATE LEVEL - Abnormal; Notable for the following components:   Salicylate Lvl <7.0 (*)    All other components within normal limits  ACETAMINOPHEN LEVEL - Abnormal; Notable for the following components:   Acetaminophen (Tylenol), Serum <10 (*)    All other components within normal limits  CBC  HCG, SERUM, QUALITATIVE  RAPID URINE DRUG SCREEN, HOSP PERFORMED  ACETAMINOPHEN LEVEL    EKG EKG Interpretation Date/Time:  Wednesday June 20 2023 20:09:31 EST Ventricular Rate:  89 PR Interval:  130 QRS Duration:  105 QT Interval:  381 QTC Calculation: 464 R Axis:   86  Text Interpretation: Sinus rhythm Nonspecific T abnrm, anterolateral leads No significant change since last tracing Confirmed by Elayne Snare ([id number]) on 06/20/2023 9:08:36 PM  Radiology No results found.  Procedures Procedures    Medications  Ordered in ED Medications  sodium chloride 0.9 % bolus 1,000 mL (0 mLs Intravenous Stopped 06/20/23 2053)    ED Course/ Medical Decision Making/ A&P Clinical Course as of 06/20/23 2142  Wed Jun 20, 2023  2018 30 600mg  gabapentin, 3mg  xanax, 4 shots tequila [CG]  2019 Lethargy with gabapentin, nausea vomiting diarrhea. With tequila and xanax, concern for hypoxia 2/2 to CNS depression. IV fluids for hypotension. Vasopressors if IV fluids dont work. Repeat acetaminophen at 4 hours/ [CG]  2020 Atleast 6 hour observation.  [CG]    Clinical Course User Index [CG] Al Decant, PA-C   Medical Decision Making Amount and/or Complexity of Data Reviewed Labs:  ordered.   43 year old female presents for evaluation.  Please see HPI for further details.  On exam the patient is very lethargic.  Patient arouses to sternal rub.  Reports that she took thirty 600 mg gabapentin, 3 mg of Xanax, multiple shots of tequila.  Reports that she did this in an attempt into her life.  IVC paperwork filled out at this time.  She is hypoxic with oxygen saturation 90%.  The patient was placed on a nasal cannula at this time 2 L or minute.  Blood pressure slightly soft with 90 systolic.  Provided with 1 L of fluid for hypotension.  Patient lung sounds are clear bilaterally.  Abdomen soft and compressible.  Patient arouses to sternal rub and follows commands.  Spoke with poison control who recommends repeat acetaminophen level at 4 hours.  They recommend at least a 6-hour observation.  Labs drawn for the patient.  Patient lab significant for alcohol level of 330.  No white count elevation, no anemia.  Metabolic panel unremarkable.  AST and ALT are elevated in the setting of alcohol intoxication.  Acetaminophen, salicylate undetectable.  At this time, patient will require time to metabolize.  Will require TTS consult to determine disposition.  Will sign out to oncoming provider [facility].  Plan of management  discussed.   Final Clinical Impression(s) / ED Diagnoses Final diagnoses:  Suicide attempt [facility])    Rx / DC Orders ED Discharge Orders     None         Al Decant, PA-C 06/20/23 2142    Elayne Snare K, DO 06/20/23 2335

## 2023-06-20 NOTE — BH Assessment (Signed)
 TTS attempted to assess pt. Pt is currently not awake or alert due to recent overdose. Per chart, pt took 6, 600mg  gabapentin tabs and 3, 300mg  gabapentin tabs, 1.5mg  of xanax, and 2 shots of tequila . Jordan Foster, Paramedic reports that pt is to be observed for 6 hours per poison control. TTS will be notified once pt is awake and alert for assessment.

## 2023-06-21 ENCOUNTER — Inpatient Hospital Stay (HOSPITAL_COMMUNITY)
Admission: AD | Admit: 2023-06-21 | Discharge: 2023-06-23 | DRG: 885 | Disposition: A | Discharge status: Home / Self Care | Payer: Federal, State, Local not specified - PPO | Source: Other Acute Inpatient Hospital | Attending: Psychiatry | Admitting: Psychiatry

## 2023-06-21 ENCOUNTER — Encounter (HOSPITAL_COMMUNITY): Payer: Self-pay | Admitting: Nurse Practitioner

## 2023-06-21 DIAGNOSIS — Z91410 Personal history of adult physical and sexual abuse: Secondary | ICD-10-CM

## 2023-06-21 DIAGNOSIS — F4310 Post-traumatic stress disorder, unspecified: Secondary | ICD-10-CM | POA: Diagnosis present

## 2023-06-21 DIAGNOSIS — Z87820 Personal history of traumatic brain injury: Secondary | ICD-10-CM

## 2023-06-21 DIAGNOSIS — I10 Essential (primary) hypertension: Secondary | ICD-10-CM | POA: Diagnosis present

## 2023-06-21 DIAGNOSIS — T424X2D Poisoning by benzodiazepines, intentional self-harm, subsequent encounter: Secondary | ICD-10-CM | POA: Diagnosis not present

## 2023-06-21 DIAGNOSIS — Z79899 Other long term (current) drug therapy: Secondary | ICD-10-CM | POA: Diagnosis not present

## 2023-06-21 DIAGNOSIS — F411 Generalized anxiety disorder: Secondary | ICD-10-CM | POA: Diagnosis present

## 2023-06-21 DIAGNOSIS — Z5989 Other problems related to housing and economic circumstances: Secondary | ICD-10-CM | POA: Diagnosis not present

## 2023-06-21 DIAGNOSIS — F1020 Alcohol dependence, uncomplicated: Secondary | ICD-10-CM | POA: Diagnosis present

## 2023-06-21 DIAGNOSIS — T426X2A Poisoning by other antiepileptic and sedative-hypnotic drugs, intentional self-harm, initial encounter: Secondary | ICD-10-CM | POA: Diagnosis not present

## 2023-06-21 DIAGNOSIS — F333 Major depressive disorder, recurrent, severe with psychotic symptoms: Principal | ICD-10-CM | POA: Diagnosis present

## 2023-06-21 DIAGNOSIS — F1290 Cannabis use, unspecified, uncomplicated: Secondary | ICD-10-CM | POA: Diagnosis present

## 2023-06-21 DIAGNOSIS — Z62810 Personal history of physical and sexual abuse in childhood: Secondary | ICD-10-CM | POA: Diagnosis not present

## 2023-06-21 DIAGNOSIS — F17290 Nicotine dependence, other tobacco product, uncomplicated: Secondary | ICD-10-CM | POA: Diagnosis present

## 2023-06-21 DIAGNOSIS — Z9151 Personal history of suicidal behavior: Secondary | ICD-10-CM

## 2023-06-21 DIAGNOSIS — T1491XA Suicide attempt, initial encounter: Secondary | ICD-10-CM | POA: Diagnosis not present

## 2023-06-21 DIAGNOSIS — K5090 Crohn's disease, unspecified, without complications: Secondary | ICD-10-CM | POA: Diagnosis present

## 2023-06-21 DIAGNOSIS — Z9185 Personal history of military service: Secondary | ICD-10-CM

## 2023-06-21 DIAGNOSIS — Z7951 Long term (current) use of inhaled steroids: Secondary | ICD-10-CM | POA: Diagnosis not present

## 2023-06-21 DIAGNOSIS — F1490 Cocaine use, unspecified, uncomplicated: Secondary | ICD-10-CM | POA: Diagnosis present

## 2023-06-21 DIAGNOSIS — Z8249 Family history of ischemic heart disease and other diseases of the circulatory system: Secondary | ICD-10-CM

## 2023-06-21 DIAGNOSIS — Z91040 Latex allergy status: Secondary | ICD-10-CM | POA: Diagnosis not present

## 2023-06-21 LAB — ACETAMINOPHEN LEVEL: Acetaminophen (Tylenol), Serum: <10 ug/mL — ABNORMAL LOW (ref 10–30)

## 2023-06-21 LAB — RAPID URINE DRUG SCREEN, HOSP PERFORMED
Amphetamines: NOT DETECTED
Barbiturates: NOT DETECTED
Benzodiazepines: POSITIVE — AB
Cocaine: NOT DETECTED
Opiates: NOT DETECTED
Tetrahydrocannabinol: NOT DETECTED

## 2023-06-21 MED ORDER — LORAZEPAM 1 MG PO TABS: 1.0000 mg | ORAL_TABLET | Freq: Two times a day (BID) | ORAL | Status: DC

## 2023-06-21 MED ORDER — DESVENLAFAXINE SUCCINATE ER 50 MG PO TB24
50.0000 mg | ORAL_TABLET | Freq: Every day | ORAL | Status: DC
  Administered 2023-06-21 – 2023-06-23 (×3): 50 mg via ORAL
  Filled 2023-06-21 (×6): qty 1

## 2023-06-21 MED ORDER — HALOPERIDOL 5 MG PO TABS: 5.0000 mg | ORAL_TABLET | Freq: Three times a day (TID) | ORAL | Status: DC | PRN

## 2023-06-21 MED ORDER — PRAZOSIN HCL 1 MG PO CAPS: 2.0000 mg | ORAL_CAPSULE | Freq: Every evening | ORAL | Status: DC | PRN

## 2023-06-21 MED ORDER — HALOPERIDOL LACTATE 5 MG/ML IJ SOLN: 5.0000 mg | Freq: Three times a day (TID) | INTRAMUSCULAR | Status: DC | PRN

## 2023-06-21 MED ORDER — HALOPERIDOL LACTATE 5 MG/ML IJ SOLN: 10.0000 mg | Freq: Three times a day (TID) | INTRAMUSCULAR | Status: DC | PRN

## 2023-06-21 MED ORDER — HYDROXYZINE HCL 25 MG PO TABS: 25.0000 mg | ORAL_TABLET | Freq: Four times a day (QID) | ORAL | Status: DC | PRN

## 2023-06-21 MED ORDER — LIDOCAINE 5 % EX PTCH
1.0000 | MEDICATED_PATCH | Freq: Every day | CUTANEOUS | Status: DC | PRN
Start: 2023-06-21 — End: 2023-06-23

## 2023-06-21 MED ORDER — HYDROXYZINE HCL 25 MG PO TABS
25.0000 mg | ORAL_TABLET | Freq: Three times a day (TID) | ORAL | Status: DC | PRN
  Administered 2023-06-22 – 2023-06-23 (×2): 25 mg via ORAL
  Filled 2023-06-21 (×2): qty 1

## 2023-06-21 MED ORDER — LOSARTAN POTASSIUM 50 MG PO TABS
50.0000 mg | ORAL_TABLET | Freq: Every day | ORAL | Status: DC
  Administered 2023-06-21 – 2023-06-22 (×2): 50 mg via ORAL
  Filled 2023-06-21 (×5): qty 1

## 2023-06-21 MED ORDER — LORAZEPAM 1 MG PO TABS: 1.0000 mg | ORAL_TABLET | Freq: Every day | ORAL | Status: DC

## 2023-06-21 MED ORDER — GABAPENTIN 300 MG PO CAPS
600.0000 mg | ORAL_CAPSULE | Freq: Four times a day (QID) | ORAL | Status: DC
  Administered 2023-06-21 – 2023-06-23 (×8): 600 mg via ORAL
  Filled 2023-06-21 (×15): qty 2

## 2023-06-21 MED ORDER — PROPRANOLOL HCL ER 60 MG PO CP24
60.0000 mg | ORAL_CAPSULE | Freq: Every day | ORAL | Status: DC
  Administered 2023-06-21 – 2023-06-23 (×3): 60 mg via ORAL
  Filled 2023-06-21 (×6): qty 1

## 2023-06-21 MED ORDER — VITAMIN B-1 100 MG PO TABS
100.0000 mg | ORAL_TABLET | Freq: Every day | ORAL | Status: DC
  Administered 2023-06-22 – 2023-06-23 (×2): 100 mg via ORAL
  Filled 2023-06-21 (×4): qty 1

## 2023-06-21 MED ORDER — TRAZODONE HCL 50 MG PO TABS
50.0000 mg | ORAL_TABLET | Freq: Every evening | ORAL | Status: DC | PRN
  Administered 2023-06-21 – 2023-06-22 (×2): 50 mg via ORAL
  Filled 2023-06-21 (×2): qty 1

## 2023-06-21 MED ORDER — DIPHENHYDRAMINE HCL 25 MG PO CAPS: 50.0000 mg | ORAL_CAPSULE | Freq: Three times a day (TID) | ORAL | Status: DC | PRN

## 2023-06-21 MED ORDER — ADULT MULTIVITAMIN W/MINERALS CH
1.0000 | ORAL_TABLET | Freq: Every day | ORAL | Status: DC
  Administered 2023-06-21 – 2023-06-23 (×3): 1 via ORAL
  Filled 2023-06-21 (×6): qty 1

## 2023-06-21 MED ORDER — PANTOPRAZOLE SODIUM 40 MG PO TBEC
40.0000 mg | DELAYED_RELEASE_TABLET | Freq: Every day | ORAL | Status: DC
  Administered 2023-06-21 – 2023-06-23 (×3): 40 mg via ORAL
  Filled 2023-06-21 (×6): qty 1

## 2023-06-21 MED ORDER — NICOTINE POLACRILEX 2 MG MT GUM: 2.0000 mg | CHEWING_GUM | OROMUCOSAL | Status: DC | PRN

## 2023-06-21 MED ORDER — ARIPIPRAZOLE 10 MG PO TABS
10.0000 mg | ORAL_TABLET | Freq: Every day | ORAL | Status: DC
  Administered 2023-06-21 – 2023-06-23 (×3): 10 mg via ORAL
  Filled 2023-06-21 (×5): qty 1

## 2023-06-21 MED ORDER — FLUTICASONE FUROATE-VILANTEROL 200-25 MCG/ACT IN AEPB
1.0000 | INHALATION_SPRAY | Freq: Every day | RESPIRATORY_TRACT | Status: DC
  Administered 2023-06-21 – 2023-06-23 (×3): 1 via RESPIRATORY_TRACT
  Filled 2023-06-21: qty 28

## 2023-06-21 MED ORDER — LOPERAMIDE HCL 2 MG PO CAPS: 2.0000 mg | ORAL_CAPSULE | ORAL | Status: DC | PRN

## 2023-06-21 MED ORDER — LORAZEPAM 2 MG/ML IJ SOLN: 2.0000 mg | Freq: Three times a day (TID) | INTRAMUSCULAR | Status: DC | PRN

## 2023-06-21 MED ORDER — LORAZEPAM 1 MG PO TABS
1.0000 mg | ORAL_TABLET | Freq: Four times a day (QID) | ORAL | Status: AC
  Administered 2023-06-21 (×3): 1 mg via ORAL
  Filled 2023-06-21 (×3): qty 1

## 2023-06-21 MED ORDER — DIPHENHYDRAMINE HCL 50 MG/ML IJ SOLN
50.0000 mg | Freq: Three times a day (TID) | INTRAMUSCULAR | Status: DC | PRN
Start: 2023-06-21 — End: 2023-06-23

## 2023-06-21 MED ORDER — LORAZEPAM 1 MG PO TABS
1.0000 mg | ORAL_TABLET | Freq: Three times a day (TID) | ORAL | Status: AC
  Administered 2023-06-22 (×3): 1 mg via ORAL
  Filled 2023-06-21 (×3): qty 1

## 2023-06-21 MED ORDER — ALBUTEROL SULFATE HFA 108 (90 BASE) MCG/ACT IN AERS
2.0000 | INHALATION_SPRAY | RESPIRATORY_TRACT | Status: DC | PRN
  Administered 2023-06-22: 2 via RESPIRATORY_TRACT
  Filled 2023-06-21: qty 6.7

## 2023-06-21 MED ORDER — ONDANSETRON 4 MG PO TBDP
4.0000 mg | ORAL_TABLET | Freq: Four times a day (QID) | ORAL | Status: DC | PRN
  Administered 2023-06-21 – 2023-06-23 (×3): 4 mg via ORAL
  Filled 2023-06-21 (×3): qty 1

## 2023-06-21 MED ORDER — LORAZEPAM 1 MG PO TABS: 1.0000 mg | ORAL_TABLET | Freq: Four times a day (QID) | ORAL | Status: DC | PRN

## 2023-06-21 MED ORDER — DIPHENHYDRAMINE HCL 50 MG/ML IJ SOLN: 50.0000 mg | Freq: Three times a day (TID) | INTRAMUSCULAR | Status: DC | PRN

## 2023-06-21 NOTE — ED Provider Notes (Signed)
 Patient has been observed for greater than 6 hours.  Vital signs have normalized.  No longer requiring oxygen.  TTS consult pending.  Discharge per TTS.     Roxy Horseman, PA-C 06/21/23 [id number]    Nicanor Alcon, April, MD 06/21/23 [phone]

## 2023-06-21 NOTE — Progress Notes (Signed)
 Pt has been accepted to [facility] on 06/21/2023 Bed assignment: 303-1  Pt meets inpatient criteria per: Phebe Colla NP  Attending Physician will be: Dr. Phineas Inches, MD   Report can be called to:Adult unit: [phone]  Pt can arrive after Discharges   Care Team Notified: [facility] Linsey Strader RN,Valerie Erma Heritage RN, Roddie Mc RN, April Wilson     [place] Simya Tercero LCSW-A   06/21/2023 9:16 AM
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
## 2023-06-21 NOTE — Plan of Care (Signed)

## 2023-06-21 NOTE — ED Notes (Signed)
 Pt asked for ginger ale and it was given.
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
## 2023-06-21 NOTE — BHH Group Notes (Signed)
 Psychoeducational Group Note  Date:  06/21/2023 Time:  2000  Group Topic/Focus:  Wrap up group  Participation Level: Did Not Attend  Participation Quality:  Not Applicable  Affect:  Not Applicable  Cognitive:  Not Applicable  Insight:  Not Applicable  Engagement in Group: Not Applicable  Additional Comments:  Did not attend.  Marcille Buffy 06/21/2023, 9:25 PM

## 2023-06-21 NOTE — ED Notes (Signed)
 Placed a bag with pt meds in her locker and locked up.  Meds were brought from Triage and were found there on the counter unsecured by staff this morning.
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
## 2023-06-21 NOTE — BH Assessment (Signed)
 TTS attempted to assess pt. [name],RN reports that she attempted to wake pt up for assessment but continues to be somnolent for an assessment. TTS will be notified once pt is awake and alert for assessment.
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
## 2023-06-21 NOTE — ED Notes (Signed)
Patient currently speaking with TTS 

## 2023-06-21 NOTE — ED Notes (Signed)
 Pt has 1 belongings bag placed in locker
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
## 2023-06-22 ENCOUNTER — Encounter (HOSPITAL_COMMUNITY): Payer: Self-pay

## 2023-06-22 DIAGNOSIS — T1491XA Suicide attempt, initial encounter: Secondary | ICD-10-CM | POA: Diagnosis not present

## 2023-06-22 NOTE — BH Assessment (Signed)
 Pt was in bed at time of assessment at 2120 on 06/21/2023, awakened without difficulty, pt cooperative. Alert and oriented, answers questions appropriately. Denies SI/HI. Pt reports anxiety d/t her girlfriend breaking up with her. Pt did get up and come to window for her meds and ask for a snack. Pt did not attend evening group.

## 2023-06-22 NOTE — Progress Notes (Signed)
 Collateral contact - Department of [facility] ([place])- [phone]    Patient asked CSW to call the [place] office to inquire if she needs to be transferred to the [facility] hospital, as they provide mental health services (she doesn't want a bill from the current hospital). She said that she would prefer to remain here.  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  If patient stays until Sunday, she should be fine.  However, if she is still in the hospital on Monday, she should call [facility], [phone], as they should be informed about it.  The woman said that, to be on the safe side, patient may want to call them already.    CSW provided patient with the phone number.    Read Drivers, LCSWA 06/22/2023

## 2023-06-22 NOTE — Plan of Care (Signed)
 Pt presents with sad expression and depressed affect. Denies SI, HI, AVH, and pain. Reports anxiety and depression 8/10 related to the recent loss of her relationship with her girlfriend. Pt is still experiencing tremors and anxiety symptoms related to withdrawal and continues to be monitored with [facility]. Medication compliant with no adverse reactions. Safety checks maintained at q 15 minutes. Support, encouragement, and reassurance offered to the pt.   Problem: Education: Goal: Emotional status will improve Outcome: Progressing Goal: Mental status will improve Outcome: Progressing Goal: Verbalization of understanding the information provided will improve Outcome: Progressing   Problem: Activity: Goal: Interest or engagement in activities will improve Outcome: Progressing   Problem: Safety: Goal: Periods of time without injury will increase Outcome: Progressing

## 2023-06-22 NOTE — Progress Notes (Signed)
   06/22/23 0544  15 Minute Checks  Location Bedroom  Visual Appearance Calm  Behavior Composed  Sleep (Behavioral Health Patients Only)  Calculate sleep? (Click Yes once per 24 hr at 0600 safety check) Yes  Documented sleep last 24 hours 7.75

## 2023-06-22 NOTE — Progress Notes (Signed)
 [facility] MD Progress Note  06/22/2023 12:26 PM Jordan Foster  MRN:  [id number]  Subjective:     Patient is a 43 year old female with a past psychiatric history of MDD, GAD, PTSD and alcohol use disorder, who was admitted to the psychiatric unit after overdosing on gabapentin and Xanax. She has a history of severe suicide attempt resulting in her actually flat from 7 minutes back in 2018. She was medically cleared by outside hospital prior to this admission.   Yesterday the psychiatry team made the following recommendations: -Restart home Pristiq at 50 mg once daily for MDD GAD and PTSD -Restart home Abilify 10 mg once daily for MDD -Restart present 2 mg nightly as needed for nightmares associated with PTSD -Start CIWA with scheduled Ativan taper for alcohol use disorder  On assessment today, the patient reports that her mood is less depressed.  Reports she does not experience to her knowledge any alcohol withdrawal symptoms.  She is on an Ativan taper.  She reports anxiety is elevated still about wanting to return home and be discharged.  She states she has a friend that can come live with her for a period of time, as otherwise she would be alone at home, without any supervision for safety.  Reports appetite is okay.  Reports sleep is okay.  Denies any SI.  Denies any side effects to restarting medications yesterday.   Principal Problem: Major depressive disorder, recurrent, severe with psychotic features (HCC) Diagnosis: Principal Problem:   Major depressive disorder, recurrent, severe with psychotic features (HCC) Active Problems:   PTSD (post-traumatic stress disorder)   GAD (generalized anxiety disorder)   Alcohol use disorder, severe, dependence (HCC)  Total Time spent with patient: 20 minutes  Past Psychiatric History:  Patient reports she was previously diagnosed with MDD, GAD, PTSD.  She also has alcohol use disorder.  Reports having a total of 3 psychiatric hospitalizations  prior to this, 1 in [place] after she attempted suicide by cutting her left arm, 1 in 2018 after overdosing on medication, causing her to flatline for 7 minutes, and then was admitted to a psychiatric hospital, and also in 2023 when she was having suicidal thoughts admitted to the psychiatric hospital.  Patient reports medication trials outside of her current medications include Zoloft and Paxil.  Patient reports she has a current psychiatrist Loura Halt, who prescribes her prescription psychiatric medications. Overall the patient reports that her Pristiq and Abilify are helpful for treating her depressive symptoms and PTSD symptoms outside of the current psychosocial stressors.  She would like to continue his medications unchanged.  She reports prazosin is helpful for nightmares and takes this as needed.  Past Medical History:  Past Medical History:  Diagnosis Date   Asthma    Depression    Hypertension    Positive TB test     Past Surgical History:  Procedure Laterality Date   LASIK     NASAL SINUS SURGERY     Family History:  Family History  Problem Relation Age of Onset   Hypertension Mother    Family Psychiatric  History: See H&P Social History:  Social History   Substance and Sexual Activity  Alcohol Use Yes   Alcohol/week: 35.0 standard drinks of alcohol   Types: 35 Shots of liquor per week     Social History   Substance and Sexual Activity  Drug Use Not Currently    Social History   Socioeconomic History   Marital status: Single    Spouse  name: Not on file   Number of children: 0   Years of education: 21   Highest education level: Not on file  Occupational History   Occupation: [employer]   Tobacco Use   Smoking status: Never   Smokeless tobacco: Never  Vaping Use   Vaping status: Every Day   Substances: Nicotine  Substance and Sexual Activity   Alcohol use: Yes    Alcohol/week: 35.0 standard drinks of alcohol    Types: 35 Shots of liquor  per week   Drug use: Not Currently   Sexual activity: Yes    Comment: W/ Female Partner  Other Topics Concern   Not on file  Social History Narrative   Fun/Hobby: Working to determine   Social Drivers of Corporate investment banker Strain: Low Risk  (05/02/2023)   Received from [facility]   Overall Financial Resource Strain (CARDIA)    Difficulty of Paying Living Expenses: Not hard at all  Food Insecurity: No Food Insecurity (06/21/2023)   Hunger Vital Sign    Worried About Running Out of Food in the Last Year: Never true    Ran Out of Food in the Last Year: Never true  Transportation Needs: No Transportation Needs (06/21/2023)   PRAPARE - Administrator, Civil Service (Medical): No    Lack of Transportation (Non-Medical): No  Physical Activity: Not on file  Stress: Not on file  Social Connections: Unknown (09/03/2021)   Received from [facility]   Social Network    Social Network: Not on file   Additional Social History:                           Current Medications: Current Facility-Administered Medications  Medication Dose Route Frequency Provider Last Rate Last Admin   albuterol (VENTOLIN HFA) 108 (90 Base) MCG/ACT inhaler 2 puff  2 puff Inhalation Q4H PRN Schae Cando, Harrold Donath, MD       ARIPiprazole (ABILIFY) tablet 10 mg  10 mg Oral Daily Rashan Patient, MD   10 mg at 06/22/23 0805   desvenlafaxine (PRISTIQ) 24 hr tablet 50 mg  50 mg Oral Daily [name], Harrold Donath, MD   50 mg at 06/22/23 4782   haloperidol (HALDOL) tablet 5 mg  5 mg Oral TID PRN Onuoha, Chinwendu V, NP       And   diphenhydrAMINE (BENADRYL) capsule 50 mg  50 mg Oral TID PRN Onuoha, Chinwendu V, NP       haloperidol lactate (HALDOL) injection 5 mg  5 mg Intramuscular TID PRN Onuoha, Chinwendu V, NP       And   diphenhydrAMINE (BENADRYL) injection 50 mg  50 mg Intramuscular TID PRN Onuoha, Chinwendu V, NP       And   LORazepam (ATIVAN) injection 2 mg  2 mg Intramuscular TID PRN  Onuoha, Chinwendu V, NP       haloperidol lactate (HALDOL) injection 10 mg  10 mg Intramuscular TID PRN Onuoha, Chinwendu V, NP       And   diphenhydrAMINE (BENADRYL) injection 50 mg  50 mg Intramuscular TID PRN Onuoha, Chinwendu V, NP       And   LORazepam (ATIVAN) injection 2 mg  2 mg Intramuscular TID PRN Onuoha, Chinwendu V, NP       fluticasone furoate-vilanterol (BREO ELLIPTA) 200-25 MCG/ACT 1 puff  1 puff Inhalation Daily Savva Beamer, Harrold Donath, MD   1 puff at 06/22/23 0805   gabapentin (  NEURONTIN) capsule 600 mg  600 mg Oral QID Chinelo Benn, Harrold Donath, MD   600 mg at 06/22/23 1150   hydrOXYzine (ATARAX) tablet 25 mg  25 mg Oral TID PRN Monick Rena, Harrold Donath, MD       lidocaine (LIDODERM) 5 % 1 patch  1 patch Transdermal Daily PRN Pattricia Weiher, Harrold Donath, MD       loperamide (IMODIUM) capsule 2-4 mg  2-4 mg Oral PRN Toluwani Ruder, Harrold Donath, MD       LORazepam (ATIVAN) tablet 1 mg  1 mg Oral Q6H PRN Dari Carpenito, Harrold Donath, MD       LORazepam (ATIVAN) tablet 1 mg  1 mg Oral TID Phineas Inches, MD   1 mg at 06/22/23 1151   Followed by   Melene Muller ON 06/23/2023] LORazepam (ATIVAN) tablet 1 mg  1 mg Oral BID Amorette Charrette, Harrold Donath, MD       Followed by   Melene Muller ON 06/24/2023] LORazepam (ATIVAN) tablet 1 mg  1 mg Oral Daily Trejuan Matherne, Harrold Donath, MD       losartan (COZAAR) tablet 50 mg  50 mg Oral Daily Madhav Mohon, Harrold Donath, MD   50 mg at 06/22/23 0805   multivitamin with minerals tablet 1 tablet  1 tablet Oral Daily Lindamarie Maclachlan, Harrold Donath, MD   1 tablet at 06/22/23 9629   nicotine polacrilex (NICORETTE) gum 2 mg  2 mg Oral PRN Phineas Inches, MD       ondansetron (ZOFRAN-ODT) disintegrating tablet 4 mg  4 mg Oral Q6H PRN Phineas Inches, MD   4 mg at 06/22/23 5284   pantoprazole (PROTONIX) EC tablet 40 mg  40 mg Oral Daily Janson Lamar, Harrold Donath, MD   40 mg at 06/22/23 0805   prazosin (MINIPRESS) capsule 2 mg  2 mg Oral QHS PRN Tabb Croghan, Harrold Donath, MD       propranolol ER (INDERAL LA) 24 hr capsule 60 mg  60 mg Oral Daily  Avory Rahimi, Harrold Donath, MD   60 mg at 06/22/23 0805   thiamine (Vitamin B-1) tablet 100 mg  100 mg Oral Daily Greydis Stlouis, Harrold Donath, MD   100 mg at 06/22/23 0804   traZODone (DESYREL) tablet 50 mg  50 mg Oral QHS PRN Phineas Inches, MD   50 mg at 06/21/23 2127    Lab Results:  Results for orders placed or performed during the hospital encounter of 06/20/23 (from the past 48 hours)  Comprehensive metabolic panel     Status: Abnormal   Collection Time: 06/20/23  8:01 PM  Result Value Ref Range   Sodium 140 135 - 145 mmol/L   Potassium 3.7 3.5 - 5.1 mmol/L   Chloride 104 98 - 111 mmol/L   CO2 24 22 - 32 mmol/L   Glucose, Bld 90 70 - 99 mg/dL    Comment: Glucose reference range applies only to samples taken after fasting for at least 8 hours.   BUN 18 6 - 20 mg/dL   Creatinine, Ser 1.32 0.44 - 1.00 mg/dL   Calcium 8.9 8.9 - 44.0 mg/dL   Total Protein 7.7 6.5 - 8.1 g/dL   Albumin 4.0 3.5 - 5.0 g/dL   AST 96 (H) 15 - 41 U/L   ALT 61 (H) 0 - 44 U/L   Alkaline Phosphatase 68 38 - 126 U/L   Total Bilirubin 0.6 0.0 - 1.2 mg/dL   GFR, Estimated >10 >27 mL/min    Comment: (NOTE) Calculated using the CKD-EPI Creatinine Equation (2021)    Anion gap 12 5 - 15    Comment: Performed at [facility],  [address], [place], [place] [address]  Ethanol     Status: Abnormal   Collection Time: 06/20/23  8:01 PM  Result Value Ref Range   Alcohol, Ethyl (B) 330 (HH) <10 mg/dL    Comment: CRITICAL RESULT CALLED TO, READ BACK BY AND VERIFIED WITH RIVERS, T. RN AT 2031 ON 2.26.25. FA (NOTE) Lowest detectable limit for serum alcohol is 10 mg/dL.  For medical purposes only. Performed at [facility], [address] [place], [place] [address]   Salicylate level     Status: Abnormal   Collection Time: 06/20/23  8:01 PM  Result Value Ref Range   Salicylate Lvl <7.0 (L) 7.0 - 30.0 mg/dL    Comment: Performed at [facility], [address]  [place], [place] [address]  Acetaminophen level     Status: Abnormal   Collection Time: 06/20/23  8:01 PM  Result Value Ref Range   Acetaminophen (Tylenol), Serum <10 (L) 10 - 30 ug/mL    Comment: (NOTE) Therapeutic concentrations vary significantly. A range of 10-30 ug/mL  may be an effective concentration for many patients. However, some  are best treated at concentrations outside of this range. Acetaminophen concentrations >150 ug/mL at 4 hours after ingestion  and >50 ug/mL at 12 hours after ingestion are often associated with  toxic reactions.  Performed at [facility], [address], [place], [place] [address]   cbc     Status: None   Collection Time: 06/20/23  8:01 PM  Result Value Ref Range   WBC 8.0 4.0 - 10.5 K/uL   RBC 3.97 3.87 - 5.11 MIL/uL   Hemoglobin 12.1 12.0 - 15.0 g/dL   HCT 13.2 44.0 - 10.2 %   MCV 94.0 80.0 - 100.0 fL   MCH 30.5 26.0 - 34.0 pg   MCHC 32.4 30.0 - 36.0 g/dL   RDW 72.5 36.6 - 44.0 %   Platelets 176 150 - 400 K/uL   nRBC 0.0 0.0 - 0.2 %    Comment: Performed at [facility], [address], [place], [place] [address]  hCG, serum, qualitative     Status: None   Collection Time: 06/20/23  8:01 PM  Result Value Ref Range   Preg, Serum NEGATIVE NEGATIVE    Comment:        THE SENSITIVITY OF THIS METHODOLOGY IS >10 mIU/mL. Performed at [facility], [address], [place], [place] [address]   Acetaminophen level     Status: Abnormal   Collection Time: 06/21/23 12:30 AM  Result Value Ref Range   Acetaminophen (Tylenol), Serum <10 (L) 10 - 30 ug/mL    Comment: (NOTE) Therapeutic concentrations vary significantly. A range of 10-30 ug/mL  may be an effective concentration for many patients. However, some  are best treated at concentrations outside of this range. Acetaminophen concentrations >150 ug/mL at 4 hours after ingestion  and >50 ug/mL at 12 hours after ingestion are often associated  with  toxic reactions.  Performed at [facility], [address] [place], [place] [address]   Rapid urine drug screen (hospital performed)     Status: Abnormal   Collection Time: 06/21/23 10:00 AM  Result Value Ref Range   Opiates NONE DETECTED NONE DETECTED   Cocaine NONE DETECTED NONE DETECTED   Benzodiazepines POSITIVE (A) NONE DETECTED   Amphetamines NONE DETECTED NONE DETECTED   Tetrahydrocannabinol NONE DETECTED NONE DETECTED   Barbiturates NONE DETECTED NONE  DETECTED    Comment: (NOTE) DRUG SCREEN FOR MEDICAL PURPOSES ONLY.  IF CONFIRMATION IS NEEDED FOR ANY PURPOSE, NOTIFY LAB WITHIN 5 DAYS.  LOWEST DETECTABLE LIMITS FOR URINE DRUG SCREEN Drug Class                     Cutoff (ng/mL) Amphetamine and metabolites    1000 Barbiturate and metabolites    200 Benzodiazepine                 200 Opiates and metabolites        300 Cocaine and metabolites        300 THC                            50 Performed at [facility], [address] [place], [place] [address]     Blood Alcohol level:  Lab Results  Component Value Date   ETH 330 [facility]) 06/20/2023   ETH 73 (H) 09/03/2021    Metabolic Disorder Labs: Lab Results  Component Value Date   HGBA1C 5.2 09/03/2021   MPG 102.54 09/03/2021   No results found for: "PROLACTIN" Lab Results  Component Value Date   CHOL 230 (H) 09/03/2021   TRIG 77 09/03/2021   HDL 130 09/03/2021   CHOLHDL 1.8 09/03/2021   VLDL 15 09/03/2021   LDLCALC 85 09/03/2021    Physical Findings: AIMS:  , ,  ,  ,    CIWA:  CIWA-Ar Total: 7 COWS:     Musculoskeletal: Strength & Muscle Tone: within normal limits Gait & Station: normal Patient leans: N/A  Psychiatric Specialty Exam:  Presentation  General Appearance:  Casual; Fairly Groomed  Eye Contact: Fair  Speech: Normal Rate  Speech Volume: Decreased  Handedness:No data recorded  Mood and Affect  Mood: Anxious;  Depressed  Affect: Appropriate; Congruent; Depressed   Thought Process  Thought Processes: Linear  Descriptions of Associations:Intact  Orientation:Full (Time, Place and Person)  Thought Content:Logical  History of Schizophrenia/Schizoaffective disorder:No  Duration of Psychotic Symptoms:No data recorded Hallucinations:Hallucinations: None  Ideas of Reference:None  Suicidal Thoughts:Suicidal Thoughts: No  Homicidal Thoughts:Homicidal Thoughts: No   Sensorium  Memory: Immediate Fair; Recent Fair; Remote Fair  Judgment: Poor  Insight: Poor   Executive Functions  Concentration: Fair  Attention Span: Fair  Recall: Good  Fund of Knowledge: Good  Language: Good   Psychomotor Activity  Psychomotor Activity: Psychomotor Activity: Normal   Assets  Assets:No data recorded  Sleep  Sleep: Sleep: Poor    Physical Exam: Physical Exam Vitals reviewed.  Constitutional:      Appearance: She is normal weight.  Pulmonary:     Effort: Pulmonary effort is normal.  Neurological:     Mental Status: She is alert.     Motor: No weakness.     Gait: Gait normal.  Psychiatric:        Behavior: Behavior normal.        Judgment: Judgment normal.    Review of Systems  Constitutional:  Negative for chills and fever.  Cardiovascular:  Negative for chest pain and palpitations.  Neurological:  Negative for dizziness, tingling, tremors and headaches.  Psychiatric/Behavioral:  Positive for depression and substance abuse. Negative for hallucinations, memory loss and suicidal ideas. The patient is nervous/anxious. The patient does not have insomnia.   All other systems reviewed and are negative.  Blood pressure (!) 148/79, pulse 88, temperature 98.5 F (36.9 C),  temperature source Oral, resp. rate 18, height 5\' 7"  (1.702 m), weight 74.8 kg, SpO2 98%. Body mass index is 25.84 kg/m.   Treatment Plan Summary: Daily contact with patient to assess and evaluate  symptoms and progress in treatment and Medication management  ASSESSMENT:   Diagnoses / Active Problems: MDD severe recurrent without psychotic features GAD PTSD Alcohol use disorder Tobacco use disorder     PLAN: Safety and Monitoring:             -- Involuntary admission to inpatient psychiatric unit for safety, stabilization and treatment             -- Daily contact with patient to assess and evaluate symptoms and progress in treatment             -- Patient's case to be discussed in multi-disciplinary team meeting             -- Observation Level : q15 minute checks             -- Vital signs:  q12 hours             -- Precautions: suicide, elopement, and assault   2. Psychiatric Diagnoses and Treatment:               -Continue home Pristiq at 50 mg once daily for MDD GAD and PTSD -Continue home Abilify 10 mg once daily for MDD -Continue present 2 mg nightly as needed for nightmares associated with PTSD   -Continue CIWA with scheduled Ativan taper for alcohol use disorder   --  The risks/benefits/side-effects/alternatives to this medication were discussed in detail with the patient and time was given for questions. The patient consents to medication trial.                -- Metabolic profile and EKG monitoring obtained while on an atypical antipsychotic (BMI: Lipid Panel: HbgA1c: QTc:)              -- Encouraged patient to participate in unit milieu and in scheduled group therapies              -- Short Term Goals: Ability to identify changes in lifestyle to reduce recurrence of condition will improve, Ability to verbalize feelings will improve, Ability to disclose and discuss suicidal ideas, Ability to demonstrate self-control will improve, Ability to identify and develop effective coping behaviors will improve, Ability to maintain clinical measurements within normal limits will improve, Compliance with prescribed medications will improve, and Ability to identify triggers  associated with substance abuse/mental health issues will improve             -- Long Term Goals: Improvement in symptoms so as ready for discharge                3. Medical Issues Being Addressed:              Tobacco Use Disorder             -- Nicotine patch 21mg /24 hours as needed plus nicotine gum as needed             -- Smoking cessation encouraged     Restart Cozaar and propranolol for hypertension and tachycardia     4. Discharge Planning:              -- Social work and case management to assist with discharge planning and identification of hospital follow-up needs prior to discharge             --  Estimated LOS: 2-4 more days             -- Discharge Concerns: Need to establish a safety plan; Medication compliance and effectiveness             -- Discharge Goals: Return home with outpatient referrals for mental health follow-up including medication management/psychotherapy      Phineas Inches, MD 06/22/2023, 12:26 PM  Total Time Spent in Direct Patient Care:  I personally spent 30 minutes on the unit in direct patient care. The direct patient care time included face-to-face time with the patient, reviewing the patient's chart, communicating with other professionals, and coordinating care. Greater than 50% of this time was spent in counseling or coordinating care with the patient regarding goals of hospitalization, psycho-education, and discharge planning needs.   Phineas Inches, MD Psychiatrist

## 2023-06-22 NOTE — Progress Notes (Signed)
   06/22/23 1803 06/22/23 1806  Vital Signs  Pulse Rate 85 84  BP (!) 173/98 (!) 150/100  BP Location Left Arm Right Arm  BP Method Automatic Automatic  Patient Position (if appropriate) Sitting Sitting   Patient's BP elevated this evening. Pt denies alcohol withdrawal symptoms and headache. Patient presents with no s/s of distress at this time. [place], MD notified via secure chat. Writer encouraged patient to let staff know if she starts feeling symptomatic for high BP. Pt verbalized understanding.

## 2023-06-22 NOTE — Plan of Care (Signed)
   Problem: Education: Goal: Emotional status will improve Outcome: Not Progressing Goal: Mental status will improve Outcome: Not Progressing

## 2023-06-22 NOTE — BH IP Treatment Plan (Signed)
 Interdisciplinary Treatment and Diagnostic Plan Update  06/22/2023 Time of Session: 10:30 AM Jordan Foster MRN: [id number]  Principal Diagnosis: Major depressive disorder, recurrent, severe with psychotic features (HCC)  Secondary Diagnoses: Principal Problem:   Major depressive disorder, recurrent, severe with psychotic features (HCC) Active Problems:   PTSD (post-traumatic stress disorder)   GAD (generalized anxiety disorder)   Alcohol use disorder, severe, dependence (HCC)   Current Medications:  Current Facility-Administered Medications  Medication Dose Route Frequency Provider Last Rate Last Admin   albuterol (VENTOLIN HFA) 108 (90 Base) MCG/ACT inhaler 2 puff  2 puff Inhalation Q4H PRN Massengill, Harrold Donath, MD   2 puff at 06/22/23 1656   ARIPiprazole (ABILIFY) tablet 10 mg  10 mg Oral Daily Massengill, Harrold Donath, MD   10 mg at 06/22/23 0805   desvenlafaxine (PRISTIQ) 24 hr tablet 50 mg  50 mg Oral Daily Massengill, Harrold Donath, MD   50 mg at 06/22/23 0865   haloperidol (HALDOL) tablet 5 mg  5 mg Oral TID PRN Onuoha, Chinwendu V, NP       And   diphenhydrAMINE (BENADRYL) capsule 50 mg  50 mg Oral TID PRN Onuoha, Chinwendu V, NP       haloperidol lactate (HALDOL) injection 5 mg  5 mg Intramuscular TID PRN Onuoha, Chinwendu V, NP       And   diphenhydrAMINE (BENADRYL) injection 50 mg  50 mg Intramuscular TID PRN Onuoha, Chinwendu V, NP       And   LORazepam (ATIVAN) injection 2 mg  2 mg Intramuscular TID PRN Onuoha, Chinwendu V, NP       haloperidol lactate (HALDOL) injection 10 mg  10 mg Intramuscular TID PRN Onuoha, Chinwendu V, NP       And   diphenhydrAMINE (BENADRYL) injection 50 mg  50 mg Intramuscular TID PRN Onuoha, Chinwendu V, NP       And   LORazepam (ATIVAN) injection 2 mg  2 mg Intramuscular TID PRN Onuoha, Chinwendu V, NP       fluticasone furoate-vilanterol (BREO ELLIPTA) 200-25 MCG/ACT 1 puff  1 puff Inhalation Daily Massengill, Nathan, MD   1 puff at 06/22/23 0805    gabapentin (NEURONTIN) capsule 600 mg  600 mg Oral QID Massengill, Harrold Donath, MD   600 mg at 06/22/23 1653   hydrOXYzine (ATARAX) tablet 25 mg  25 mg Oral TID PRN Phineas Inches, MD   25 mg at 06/22/23 1653   lidocaine (LIDODERM) 5 % 1 patch  1 patch Transdermal Daily PRN Massengill, Harrold Donath, MD       loperamide (IMODIUM) capsule 2-4 mg  2-4 mg Oral PRN Massengill, Harrold Donath, MD       LORazepam (ATIVAN) tablet 1 mg  1 mg Oral Q6H PRN Massengill, Harrold Donath, MD       Melene Muller ON 06/23/2023] LORazepam (ATIVAN) tablet 1 mg  1 mg Oral BID Massengill, Nathan, MD       Followed by   Melene Muller ON 06/24/2023] LORazepam (ATIVAN) tablet 1 mg  1 mg Oral Daily Massengill, Nathan, MD       losartan (COZAAR) tablet 50 mg  50 mg Oral Daily Massengill, Nathan, MD   50 mg at 06/22/23 0805   multivitamin with minerals tablet 1 tablet  1 tablet Oral Daily Massengill, Nathan, MD   1 tablet at 06/22/23 0805   nicotine polacrilex (NICORETTE) gum 2 mg  2 mg Oral PRN Massengill, Harrold Donath, MD       ondansetron (ZOFRAN-ODT) disintegrating tablet 4 mg  4 mg  Oral Q6H PRN Phineas Inches, MD   4 mg at 06/22/23 8119   pantoprazole (PROTONIX) EC tablet 40 mg  40 mg Oral Daily Massengill, Harrold Donath, MD   40 mg at 06/22/23 0805   prazosin (MINIPRESS) capsule 2 mg  2 mg Oral QHS PRN Massengill, Harrold Donath, MD       propranolol ER (INDERAL LA) 24 hr capsule 60 mg  60 mg Oral Daily Massengill, Harrold Donath, MD   60 mg at 06/22/23 0805   thiamine (Vitamin B-1) tablet 100 mg  100 mg Oral Daily Massengill, Harrold Donath, MD   100 mg at 06/22/23 1478   traZODone (DESYREL) tablet 50 mg  50 mg Oral QHS PRN Phineas Inches, MD   50 mg at 06/21/23 2127   PTA Medications: Medications Prior to Admission  Medication Sig Dispense Refill Last Dose/Taking   ALPRAZolam (NIRAVAM) 0.5 MG dissolvable tablet Take 0.5 mg by mouth 3 (three) times daily as needed.   Taking As Needed   gabapentin (NEURONTIN) 600 MG tablet Take 600 mg by mouth 4 (four) times daily.   Taking    hydrOXYzine (ATARAX) 25 MG tablet Take 25 mg by mouth 3 (three) times daily as needed.   Taking As Needed   losartan (COZAAR) 50 MG tablet Take 100 mg by mouth daily.   Taking   Vedolizumab 108 MG/0.68ML SOAJ Inject 300 mg into the skin every 14 (fourteen) days.   Taking   albuterol (VENTOLIN HFA) 108 (90 Base) MCG/ACT inhaler Inhale 2 puffs into the lungs every 4 (four) hours as needed.      ARIPiprazole (ABILIFY) 10 MG tablet Take 1 tablet (10 mg total) by mouth daily. 30 tablet 0    budesonide-formoterol (SYMBICORT) 160-4.5 MCG/ACT inhaler Inhale 2 puffs into the lungs 2 (two) times daily.      cetirizine (ZYRTEC) 10 MG tablet Take 10 mg by mouth daily.      desvenlafaxine (PRISTIQ) 50 MG 24 hr tablet Take 1 tablet (50 mg total) by mouth daily. (Patient taking differently: Take 100 mg by mouth daily.) 30 tablet 0    ondansetron (ZOFRAN) 4 MG tablet Take 1 tablet by mouth daily.      pantoprazole (PROTONIX) 40 MG tablet Take 40 mg by mouth daily.      propranolol ER (INDERAL LA) 60 MG 24 hr capsule Take 1 capsule (60 mg total) by mouth daily. 30 capsule 0     Patient Stressors: Loss of relationship   Substance abuse   Traumatic event    Patient Strengths: Average or above average intelligence  Capable of independent living  Communication skills  Financial means  General fund of knowledge  Work skills   Treatment Modalities: Medication Management, Group therapy, Case management,  1 to 1 session with clinician, Psychoeducation, Recreational therapy.   Physician Treatment Plan for Primary Diagnosis: Major depressive disorder, recurrent, severe with psychotic features (HCC) Long Term Goal(s): Improvement in symptoms so as ready for discharge   Short Term Goals: Ability to identify changes in lifestyle to reduce recurrence of condition will improve Ability to verbalize feelings will improve Ability to disclose and discuss suicidal ideas Ability to demonstrate self-control will  improve Ability to identify and develop effective coping behaviors will improve Ability to maintain clinical measurements within normal limits will improve Compliance with prescribed medications will improve Ability to identify triggers associated with substance abuse/mental health issues will improve  Medication Management: Evaluate patient's response, side effects, and tolerance of medication regimen.  Therapeutic Interventions: 1 to  1 sessions, Unit Group sessions and Medication administration.  Evaluation of Outcomes: Not Progressing  Physician Treatment Plan for Secondary Diagnosis: Principal Problem:   Major depressive disorder, recurrent, severe with psychotic features (HCC) Active Problems:   PTSD (post-traumatic stress disorder)   GAD (generalized anxiety disorder)   Alcohol use disorder, severe, dependence (HCC)  Long Term Goal(s): Improvement in symptoms so as ready for discharge   Short Term Goals: Ability to identify changes in lifestyle to reduce recurrence of condition will improve Ability to verbalize feelings will improve Ability to disclose and discuss suicidal ideas Ability to demonstrate self-control will improve Ability to identify and develop effective coping behaviors will improve Ability to maintain clinical measurements within normal limits will improve Compliance with prescribed medications will improve Ability to identify triggers associated with substance abuse/mental health issues will improve     Medication Management: Evaluate patient's response, side effects, and tolerance of medication regimen.  Therapeutic Interventions: 1 to 1 sessions, Unit Group sessions and Medication administration.  Evaluation of Outcomes: Not Progressing   RN Treatment Plan for Primary Diagnosis: Major depressive disorder, recurrent, severe with psychotic features (HCC) Long Term Goal(s): Knowledge of disease and therapeutic regimen to maintain health will  improve  Short Term Goals: Ability to remain free from injury will improve, Ability to verbalize frustration and anger appropriately will improve, Ability to demonstrate self-control, Ability to participate in decision making will improve, Ability to verbalize feelings will improve, Ability to disclose and discuss suicidal ideas, Ability to identify and develop effective coping behaviors will improve, and Compliance with prescribed medications will improve  Medication Management: RN will administer medications as ordered by provider, will assess and evaluate patient's response and provide education to patient for prescribed medication. RN will report any adverse and/or side effects to prescribing provider.  Therapeutic Interventions: 1 on 1 counseling sessions, Psychoeducation, Medication administration, Evaluate responses to treatment, Monitor vital signs and CBGs as ordered, Perform/monitor CIWA, COWS, AIMS and Fall Risk screenings as ordered, Perform wound care treatments as ordered.  Evaluation of Outcomes: Not Progressing   LCSW Treatment Plan for Primary Diagnosis: Major depressive disorder, recurrent, severe with psychotic features (HCC) Long Term Goal(s): Safe transition to appropriate next level of care at discharge, Engage patient in therapeutic group addressing interpersonal concerns.  Short Term Goals: Engage patient in aftercare planning with referrals and resources, Increase social support, Increase ability to appropriately verbalize feelings, Increase emotional regulation, Facilitate acceptance of mental health diagnosis and concerns, Facilitate patient progression through stages of change regarding substance use diagnoses and concerns, Identify triggers associated with mental health/substance abuse issues, and Increase skills for wellness and recovery  Therapeutic Interventions: Assess for all discharge needs, 1 to 1 time with Social worker, Explore available resources and support  systems, Assess for adequacy in community support network, Educate family and significant other(s) on suicide prevention, Complete Psychosocial Assessment, Interpersonal group therapy.  Evaluation of Outcomes: Not Progressing   Progress in Treatment: Attending groups: No. Participating in groups: No. Taking medication as prescribed: Yes. Toleration medication: Yes. Family/Significant other contact made: No, consents are pending  Patient understands diagnosis: Yes. Discussing patient identified problems/goals with staff: Yes. Medical problems stabilized or resolved: Yes. Denies suicidal/homicidal ideation: Yes. Issues/concerns per patient self-inventory: No.  New problem(s) identified:  No  New Short Term/Long Term Goal(s):    medication stabilization, elimination of SI thoughts, development of comprehensive mental wellness plan.   Patient Goals:  "be in a calm place."   Discharge Plan or Barriers:  Patient recently admitted. CSW will continue to follow and assess for appropriate referrals and possible discharge planning.   Reason for Continuation of Hospitalization: Anxiety Depression Medication stabilization Suicidal ideation  Estimated Length of Stay:  5 - 7 days  Last 3 [place] Suicide Severity Risk Score: Flowsheet Row [facility] (Discharged) [facility]  C-SSRS RISK CATEGORY No Risk High Risk No Risk       Last PHQ 2/9 Scores:    09/03/2021    8:20 AM 09/22/2016   10:30 AM  Depression screen PHQ 2/9  Decreased Interest 3 1  Down, Depressed, Hopeless 3 0  PHQ - 2 Score 6 1  Altered sleeping 3 1  Tired, decreased energy 2 0  Change in appetite 1 1  Feeling bad or failure about yourself  3 0  Trouble concentrating 2 1  Moving slowly or fidgety/restless  0 0  Suicidal thoughts 2 0  PHQ-9 Score 19 4  Difficult doing work/chores Somewhat difficult Not difficult at all    Scribe for Treatment Team: Alla Feeling, LCSWA 06/22/2023 6:09 PM

## 2023-06-22 NOTE — [facility] Group Notes (Signed)
 [facility] Group Notes:  (Nursing/MHT/Case Management/Adjunct)  Date:  06/22/2023  Time:  2000  Type of Therapy:   AA group  Participation Level:  Did Not Attend  Participation Quality:   Did not attend  Affect:   Did not attend  Cognitive:  Did not attend  Insight:  None  Engagement in Group:   Did not attend  Modes of Intervention:   Did not attend  Summary of Progress/Problems:  Fay Records 06/22/2023, 8:22 PM

## 2023-06-22 NOTE — Group Note (Signed)
 Recreation Therapy Group Note   Group Topic:Problem Solving  Group Date: 06/22/2023 Start Time: 0930 End Time: 0950 Facilitators: Kathan Kirker-McCall, LRT,CTRS Location: 300 [address] Dayroom   Group Topic: Communication, Team Building, Problem Solving  Goal Area(s) Addresses:  Patient will effectively work with peer towards shared goal.  Patient will identify skill used to make activity successful.  Patient will identify how skills used during activity can be used to reach post d/c goals.  Patient will identify a daily goal. Patient will cooperate in goals group conversation.   Intervention: Cup [employer] bands with attached strings enough for each group member, 10 or more cups  Activity: Patient(s) were given a set of solo cups, a rubber band, and some tied strings. The objective is to build a pyramid with the cups by only using the rubber band and string to move the cups. After the activity the patient(s) are LRT debriefed and discussed what strategies worked, what didn't, and what lessons they can take from the activity and use in life post discharge.   Education Areas: Pharmacist, community, Counsellor, Discharge Planning   Education Outcome: Acknowledges education   Affect/Mood: N/A   Participation Level: Did not attend    Clinical Observations/Individualized Feedback:      Plan: Continue to engage patient in RT group sessions 2-3x/week.   Fernand Sorbello-McCall, LRT,CTRS 06/22/2023 11:23 AM

## 2023-06-22 NOTE — Progress Notes (Signed)
  During medication administration, patient requested help with sleep.  Administered PRN Trazodone per [facility] per patient request.

## 2023-06-23 DIAGNOSIS — T1491XA Suicide attempt, initial encounter: Secondary | ICD-10-CM

## 2023-06-23 MED ORDER — ARIPIPRAZOLE 10 MG PO TABS: 10.0000 mg | ORAL_TABLET | Freq: Every day | ORAL | 0 refills | Status: DC

## 2023-06-23 MED ORDER — LOSARTAN POTASSIUM 50 MG PO TABS: 100.0000 mg | ORAL_TABLET | Freq: Every day | ORAL | 0 refills | Status: DC

## 2023-06-23 MED ORDER — LORAZEPAM 1 MG PO TABS
1.0000 mg | ORAL_TABLET | Freq: Every day | ORAL | Status: DC
Start: 2023-06-24 — End: 2023-06-23

## 2023-06-23 MED ORDER — NICOTINE POLACRILEX 2 MG MT GUM: 2.0000 mg | CHEWING_GUM | OROMUCOSAL | 0 refills | Status: DC | PRN

## 2023-06-23 MED ORDER — PRAZOSIN HCL 2 MG PO CAPS: 2.0000 mg | ORAL_CAPSULE | Freq: Every evening | ORAL | Status: DC | PRN

## 2023-06-23 MED ORDER — LORAZEPAM 1 MG PO TABS
1.0000 mg | ORAL_TABLET | Freq: Two times a day (BID) | ORAL | Status: AC
  Administered 2023-06-23 (×2): 1 mg via ORAL
  Filled 2023-06-23 (×2): qty 1

## 2023-06-23 MED ORDER — LOSARTAN POTASSIUM 50 MG PO TABS
100.0000 mg | ORAL_TABLET | Freq: Every day | ORAL | Status: DC
  Administered 2023-06-23: 100 mg via ORAL
  Filled 2023-06-23 (×3): qty 2

## 2023-06-23 MED ORDER — CLONIDINE HCL 0.1 MG PO TABS: 0.1000 mg | ORAL_TABLET | ORAL | Status: DC | PRN

## 2023-06-23 NOTE — Group Note (Signed)
 Date:  06/23/2023 Time:  11:14 AM  Group Topic/Focus:  Goals Group:   The focus of this group is to help patients establish daily goals to achieve during treatment and discuss how the patient can incorporate goal setting into their daily lives to aide in recovery. Orientation:   The focus of this group is to educate the patient on the purpose and policies of crisis stabilization and provide a format to answer questions about their admission.  The group details unit policies and expectations of patients while admitted.    Participation Level:  Active  Participation Quality:  Attentive  Affect:  Appropriate  Cognitive:  Appropriate  Insight: Appropriate  Engagement in Group:  Engaged  Modes of Intervention:  Discussion and Orientation  Additional Comments:   Pt was calm and cooperative during group. Pt personal goal for today is to successfully discharge from treatment.   Jordan Foster 06/23/2023, 11:14 AM

## 2023-06-23 NOTE — Progress Notes (Signed)
 Pt in room in bed.  Pt with c/o anxiety and depression.  Pt stated this was d/t recent break-up with partner.  Pt reports she had attended group earlier today.  Denied SI, HI and AVH.  Needs assessed.  Pt denied. Safety checks continue for patient safety.  Pt safe on unit.

## 2023-06-23 NOTE — Group Note (Signed)
 LCSW Group Therapy Note   Group Date: 06/23/2023 Start Time: 0100 End Time: 0145   Type of Therapy and Topic:  Group Therapy:   Participation Level:  Active  Description of Group: This process group involved patients discussing the situations or people in their lives that frequently make them safe or unsafe.  Anxiety was a common factor among all group participants and many of them described home situations that keep them on edge and not able to feel completely safe.  Three questions were addressed during the group:  (1) What makes you feel safe (or unsafe)?  (2) Do you feel safe with yourself and why?  (3) If you don't feel safe, what can you do?  A lengthy discussion ensued in which group members empathized with each other, gave suggestions to one another, and expressed their feelings freely.  Therapeutic Goals:  Patient will describe what makes them feel safe or unsafe in their everyday lives. Patient will think about and discuss whether they feel safe with themselves and what reasons might contribute to feeling safe or unsafe. Patients will participate in planning for what can be done to help themselves feel safer.    Summary of Patient Progress:    The patient was able to share with the group that her safe place on the tree house where her and her father built when she was younger. Patient share that she has past trauma and sometime she forget to use her coping skills.  Therapeutic Modalities:   London Tarnowski O Jerod Mcquain, LCSWA 06/23/2023  2:55 PM

## 2023-06-23 NOTE — Progress Notes (Signed)
 Patient verbalizes readiness for discharge. All patient belongings returned to patient. Discharge instructions read and discussed with patient (appointments, medications, resources). Patient expressed gratitude for care provided. Patient discharged to lobby at 1540 where ride was waiting.

## 2023-06-23 NOTE — Progress Notes (Signed)
  [facility] Adult Case Management Discharge Plan :  Will you be returning to the same living situation after discharge:  Yes,  Patient will return the the B&B, where she lives temporarily while her home is being repaired /renovated At discharge, do you have transportation home?: Yes,  friend Trinna Post Blood will provide transportation home Do you have the ability to pay for your medications: Yes,  patient is covered by insurance  Release of information consent forms completed and in the chart;  Patient's signature needed at discharge.  Patient to Follow up at:  Follow-up Information     [facility] Go on 07/26/2023.   Why: You have a hospital follow up appointment for therapy and medication management services on 07/26/23 at 11:30 am, in person. * Location: (2nd floor at [facility]) Contact information: [address] [place] [address] [phone]         [facility] Go on 07/26/2023.   Contact information: [address] [place] [address] [phone]         Bubba Camp, LCSW. Schedule an appointment as soon as possible for a visit.   Why: visit will be virtual Contact information: [address] [place], [place] [address] [phone]                Next level of care provider has access to [facility] Link:no  Safety Planning and Suicide Prevention discussed: Yes,  completed on 06/23/23 with Trinna Post Blood     Has patient been referred to the Quitline?: Patient refused referral for treatment patient states that she does not use nicotine products but does vape  Patient has been referred for addiction treatment: Patient refused referral for treatment.  [address], [place], [place] 06/23/2023, 3:15 PM
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
## 2023-06-23 NOTE — Discharge Instructions (Signed)
-  Follow-up with your outpatient psychiatric provider -instructions on appointment date, time, and address (location) are provided to you in discharge paperwork.  -Take your psychiatric medications as prescribed at discharge - instructions are provided to you in the discharge paperwork As we discussed, ask your outpatient PCP or psychiatrist about naltrexone. We could not start it here bc your liver enzymes are elevated, but this is a medication that can reduce cravings for alcohol, that you can start as an outpatient.   -Follow-up with outpatient primary care doctor and other specialists -for management of preventative medicine and any chronic medical disease.  -Recommend abstinence from alcohol, tobacco, and other illicit drug use at discharge.   -If your psychiatric symptoms recur, worsen, or if you have side effects to your psychiatric medications, call your outpatient psychiatric provider, 911, 988 or go to the nearest emergency department.  -If suicidal thoughts occur, call your outpatient psychiatric provider, 911, 988 or go to the nearest emergency department.  Naloxone (Narcan) can help reverse an overdose when given to the victim quickly.  [facility] offers free naloxone kits and instructions/training on its use.  Add naloxone to your first aid kit and you can help save a life.   Pick up your free kit at the following locations:   [place]:  [facility], [address] [place] [address] [phone]) [facility] [address] [place] [address] [phone]) [facility] [address] [place] [address]  [place]: [facility] [address] [place] [address] ([phone]) [facility] [address] [place] [address] [phone])

## 2023-06-23 NOTE — BHH Suicide Risk Assessment (Addendum)
 [facility] INPATIENT:  Family/Significant Other Suicide Prevention Education  Suicide Prevention Education:  Education Completed; Alex Blood,  (name of family member/significant other) has been identified by the patient as the family member/significant other with whom the patient will be residing, and identified as the person(s) who will aid the patient in the event of a mental health crisis (suicidal ideations/suicide attempt).  With written consent from the patient, the family member/significant other has been provided the following suicide prevention education, prior to the and/or following the discharge of the patient.  CSW confirmed with Trinna Post Blood that there are no firearms in their current residence at the B&B but there are firearms in the home that patient is currently renovating. Per Trinna Post, she is not set to move back there for another few weeks but will make sure that once she moves back in they will removed.  3:07pm Return phone call to [facility] Blood, confirmed that the key to the fire arms is kept in a separate safe. Alex agrees to go to the home that is being renovated and will secure the key to ensure that patient does not have access to the firearms.  The suicide prevention education provided includes the following: Suicide risk factors Suicide prevention and interventions National Suicide Hotline telephone number [facility] assessment telephone number [facility] Emergency Assistance 911 [facility] and/or Residential Mobile Crisis Unit telephone number  Request made of family/significant other to: Remove weapons (e.g., guns, rifles, knives), all items previously/currently identified as safety concern.   Remove drugs/medications (over-the-counter, prescriptions, illicit drugs), all items previously/currently identified as a safety concern.   The family member/significant other verbalizes understanding of the suicide prevention education information provided.  The family  member/significant other agrees to remove the items of safety concern listed above.  Verna Czech [place] 06/23/2023, 12:39 PM
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
## 2023-06-23 NOTE — Plan of Care (Signed)

## 2023-10-18 ENCOUNTER — Emergency Department (HOSPITAL_COMMUNITY)
Admission: EM | Admit: 2023-10-18 | Discharge: 2023-10-18 | Disposition: A | Discharge status: Home / Self Care | Attending: Emergency Medicine | Admitting: Emergency Medicine

## 2023-10-18 ENCOUNTER — Encounter (HOSPITAL_COMMUNITY): Payer: Self-pay

## 2023-10-18 ENCOUNTER — Emergency Department (HOSPITAL_COMMUNITY)

## 2023-10-18 ENCOUNTER — Other Ambulatory Visit: Payer: Self-pay

## 2023-10-18 DIAGNOSIS — R7989 Other specified abnormal findings of blood chemistry: Secondary | ICD-10-CM | POA: Insufficient documentation

## 2023-10-18 DIAGNOSIS — R079 Chest pain, unspecified: Secondary | ICD-10-CM | POA: Insufficient documentation

## 2023-10-18 DIAGNOSIS — Z79899 Other long term (current) drug therapy: Secondary | ICD-10-CM | POA: Diagnosis not present

## 2023-10-18 DIAGNOSIS — R0602 Shortness of breath: Secondary | ICD-10-CM | POA: Diagnosis not present

## 2023-10-18 DIAGNOSIS — I10 Essential (primary) hypertension: Secondary | ICD-10-CM | POA: Diagnosis not present

## 2023-10-18 DIAGNOSIS — R300 Dysuria: Secondary | ICD-10-CM | POA: Insufficient documentation

## 2023-10-18 DIAGNOSIS — Z91040 Latex allergy status: Secondary | ICD-10-CM | POA: Insufficient documentation

## 2023-10-18 DIAGNOSIS — J45909 Unspecified asthma, uncomplicated: Secondary | ICD-10-CM | POA: Insufficient documentation

## 2023-10-18 DIAGNOSIS — Z7951 Long term (current) use of inhaled steroids: Secondary | ICD-10-CM | POA: Insufficient documentation

## 2023-10-18 LAB — URINALYSIS, W/ REFLEX TO CULTURE (INFECTION SUSPECTED)
Bilirubin Urine: NEGATIVE
Glucose, UA: NEGATIVE mg/dL
Hgb urine dipstick: NEGATIVE
Ketones, ur: NEGATIVE mg/dL
Nitrite: NEGATIVE
Protein, ur: NEGATIVE mg/dL
Specific Gravity, Urine: 1.008 (ref 1.005–1.030)
pH: 5 (ref 5.0–8.0)

## 2023-10-18 LAB — BASIC METABOLIC PANEL WITH GFR
Anion gap: 13 (ref 5–15)
BUN: 19 mg/dL (ref 6–20)
CO2: 24 mmol/L (ref 22–32)
Calcium: 9.1 mg/dL (ref 8.9–10.3)
Chloride: 103 mmol/L (ref 98–111)
Creatinine, Ser: 1.21 mg/dL — ABNORMAL HIGH (ref 0.44–1.00)
GFR, Estimated: 57 mL/min — ABNORMAL LOW (ref >60)
Glucose, Bld: 98 mg/dL (ref 70–99)
Potassium: 3.6 mmol/L (ref 3.5–5.1)
Sodium: 140 mmol/L (ref 135–145)

## 2023-10-18 LAB — CBC
HCT: 40.1 % (ref 36.0–46.0)
Hemoglobin: 13 g/dL (ref 12.0–15.0)
MCH: 32.2 pg (ref 26.0–34.0)
MCHC: 32.4 g/dL (ref 30.0–36.0)
MCV: 99.3 fL (ref 80.0–100.0)
Platelets: 278 10*3/uL (ref 150–400)
RBC: 4.04 MIL/uL (ref 3.87–5.11)
RDW: 13.2 % (ref 11.5–15.5)
WBC: 9.1 10*3/uL (ref 4.0–10.5)
nRBC: 0 % (ref 0.0–0.2)

## 2023-10-18 LAB — TROPONIN I (HIGH SENSITIVITY)
Troponin I (High Sensitivity): 2 ng/L (ref <18)
Troponin I (High Sensitivity): <2 ng/L (ref <18)

## 2023-10-18 LAB — HCG, SERUM, QUALITATIVE: Preg, Serum: NEGATIVE

## 2023-10-18 NOTE — ED Provider Notes (Signed)
 [place] EMERGENCY DEPARTMENT AT [facility] Provider Note   CSN: [id number] Arrival date & time: 10/18/23  1752     Patient presents with: Chest Pain and Shortness of Breath   Jordan Foster is a 43 y.o. female past medical history significant for asthma, hypertension, anxiety, and cocaine abuse presents today for chest pain and shortness of breath that began earlier today but worsened around 530.  Patient states it felt like an anxiety attack but none felt like a squeezing pressure on her chest which is new.  Patient also reports having dysuria times many months.  She states that her primary care physician gave her course of antibiotics but that did not help with the symptoms.  Patient denies fever, chills, back pain, hematuria, frequency, or urgency.    Chest Pain Associated symptoms: shortness of breath   Shortness of Breath Associated symptoms: chest pain        Prior to Admission medications   Medication Sig Start Date End Date Taking? Authorizing Provider  albuterol  (VENTOLIN  HFA) 108 (90 Base) MCG/ACT inhaler Inhale 2 puffs into the lungs every 4 (four) hours as needed. 04/20/19 11/12/21  [provider]  ARIPiprazole  (ABILIFY ) 10 MG tablet Take 1 tablet (10 mg total) by mouth daily. 06/23/23 07/23/23  Massengill, Rankin, MD  budesonide -formoterol  (SYMBICORT ) 160-4.5 MCG/ACT inhaler Inhale 2 puffs into the lungs 2 (two) times daily. 11/12/20 11/12/21  [provider]  cetirizine (ZYRTEC) 10 MG tablet Take 10 mg by mouth daily. 10/19/16   [provider]  desvenlafaxine  (PRISTIQ ) 50 MG 24 hr tablet Take 1 tablet (50 mg total) by mouth daily. Patient taking differently: Take 100 mg by mouth daily. 09/14/21 10/14/21  Massengill, Rankin, MD  gabapentin  (NEURONTIN ) 600 MG tablet Take 600 mg by mouth 4 (four) times daily. 06/15/23   [provider]  hydrOXYzine  (ATARAX ) 25 MG tablet Take 25 mg by mouth 3 (three) times daily as needed.  11/28/21   [provider]  losartan  (COZAAR ) 50 MG tablet Take 2 tablets (100 mg total) by mouth daily. 06/23/23   Massengill, Rankin, MD  nicotine  polacrilex (NICORETTE ) 2 MG gum Take 1 each (2 mg total) by mouth as needed for smoking cessation. 06/23/23   Massengill, Rankin, MD  ondansetron  (ZOFRAN ) 4 MG tablet Take 1 tablet by mouth daily. 04/05/21   [provider]  pantoprazole  (PROTONIX ) 40 MG tablet Take 40 mg by mouth daily. 08/04/21   [provider]  prazosin  (MINIPRESS ) 2 MG capsule Take 1 capsule (2 mg total) by mouth at bedtime as needed (nightmares). 06/23/23   Massengill, Rankin, MD  propranolol  ER (INDERAL  LA) 60 MG 24 hr capsule Take 1 capsule (60 mg total) by mouth daily. 09/13/21 10/13/21  Massengill, Rankin, MD  Vedolizumab 108 MG/0.68ML SOAJ Inject 300 mg into the skin every 14 (fourteen) days. 03/08/23   [provider]    Allergies: Latex    Review of Systems  Respiratory:  Positive for shortness of breath.   Cardiovascular:  Positive for chest pain.    Updated Vital Signs BP (!) 138/100   Pulse 94   Temp 98.2 F (36.8 C) (Oral)   Resp 16   SpO2 99%   Physical Exam Vitals and nursing note reviewed.  Constitutional:      General: She is not in acute distress.    Appearance: She is well-developed. She is not ill-appearing or diaphoretic.  HENT:     Head: Normocephalic and atraumatic.   Eyes:  Extraocular Movements: Extraocular movements intact.     Conjunctiva/sclera: Conjunctivae normal.    Cardiovascular:     Rate and Rhythm: Normal rate and regular rhythm.     Heart sounds: Normal heart sounds. No murmur heard. Pulmonary:     Effort: Pulmonary effort is normal. No respiratory distress.     Breath sounds: Normal breath sounds.  Abdominal:     Palpations: Abdomen is soft.     Tenderness: There is no abdominal tenderness.   Musculoskeletal:        General: No swelling.     Cervical back: Neck supple.     Right lower  leg: No edema.     Left lower leg: No edema.   Skin:    General: Skin is warm and dry.     Capillary Refill: Capillary refill takes less than 2 seconds.   Neurological:     General: No focal deficit present.     Mental Status: She is alert.   Psychiatric:        Mood and Affect: Mood normal.     (all labs ordered are listed, but only abnormal results are displayed) Labs Reviewed  BASIC METABOLIC PANEL WITH GFR - Abnormal; Notable for the following components:      Result Value   Creatinine, Ser 1.21 (*)    GFR, Estimated 57 (*)    All other components within normal limits  CBC  HCG, SERUM, QUALITATIVE  URINALYSIS, W/ REFLEX TO CULTURE (INFECTION SUSPECTED)  TROPONIN I (HIGH SENSITIVITY)  TROPONIN I (HIGH SENSITIVITY)    EKG: EKG Interpretation Date/Time:  Thursday October 18 2023 17:57:50 EDT Ventricular Rate:  96 PR Interval:  121 QRS Duration:  94 QT Interval:  345 QTC Calculation: 436 R Axis:   90  Text Interpretation: Sinus rhythm Borderline right axis deviation No significant change since last tracing Confirmed by Dean Clarity [phone]) on 10/18/2023 9:38:38 PM  Radiology: [facility] Chest 2 View Result Date: 10/18/2023 CLINICAL DATA:  Chest pain EXAM: CHEST - 2 VIEW COMPARISON:  None Available. FINDINGS: The heart size and mediastinal contours are within normal limits. Both lungs are clear. The visualized skeletal structures are unremarkable. IMPRESSION: No active cardiopulmonary disease. Electronically Signed   By: Dorethia Molt M.D.   On: 10/18/2023 19:29     Procedures   Medications Ordered in the ED - No data to display                                  Medical Decision Making Amount and/or Complexity of Data Reviewed Labs: ordered. Radiology: ordered.   This patient presents to the ED for concern of chest pain shortness of breath, this involves an extensive number of treatment options, and is a complaint that carries with it a high risk of complications  and morbidity.  The differential diagnosis includes STEMI, NSTEMI, anemia, arrhythmia, electrolyte abnormality, anxiety, GERD  Lab Tests:  I Ordered, and personally interpreted labs.  The pertinent results include: Mildly elevated creatinine at 1.21, CBC WNL, negative pregnancy, troponin 2, <2 UA pending   Imaging Studies ordered:  I ordered imaging studies including chest x-ray I independently visualized and interpreted imaging which showed no active cardiopulmonary disease I agree with the radiologist interpretation   Cardiac Monitoring: / EKG:  The patient was maintained on a cardiac monitor.  I personally viewed and interpreted the cardiac monitored which showed an underlying rhythm of: Sinus rhythm, borderline  RAD, no change from previous tracings  Test / Admission - Considered:  Considered for admission or further workup however patient's vital signs, physical exam, labs, and imaging of been reassuring.  Patient has asked to be discharged prior to her urinary analysis results, these will be sent for culture if necessary and the patient will be notified if those results require treatment.  Patient to follow-up with primary care if her symptoms persist for further evaluation workup.  Patient given return precautions.  I feel patient is safe for discharge at this time.     Final diagnoses:  Chest pain, unspecified type  Shortness of breath  Dysuria    ED Discharge Orders     None          Francis Ileana SAILOR, PA-C 10/18/23 2144    Franklyn Sid SAILOR, MD 10/18/23 2251

## 2023-10-18 NOTE — ED Triage Notes (Signed)
 Pt came in for chest pain and SOB earlier today but got worse about 5:30pm.

## 2023-10-18 NOTE — Discharge Instructions (Addendum)
 Today you were seen for chest pain, shortness of breath, and dysuria.  If you continue to have symptoms please follow-up with your primary care physician for further evaluation and workup.  You have 1 or more labs pending and will be notified if these labs require treatment.  Please return to the ED if you have uncontrollable vomiting, worsening chest pain, or worsening shortness of breath.  Thank you for letting us  treat you today. After reviewing your labs and imaging, I feel you are safe to go home. Please follow up with your PCP in the next several days and provide them with your records from this visit. Return to the Emergency Room if pain becomes severe or symptoms worsen.

## 2023-11-26 ENCOUNTER — Emergency Department (HOSPITAL_COMMUNITY)

## 2023-11-26 ENCOUNTER — Emergency Department (HOSPITAL_COMMUNITY)
Admission: EM | Admit: 2023-11-26 | Discharge: 2023-11-26 | Disposition: A | Discharge status: Home / Self Care | Attending: Emergency Medicine | Admitting: Emergency Medicine

## 2023-11-26 ENCOUNTER — Encounter (HOSPITAL_COMMUNITY): Payer: Self-pay | Admitting: Emergency Medicine

## 2023-11-26 ENCOUNTER — Other Ambulatory Visit: Payer: Self-pay

## 2023-11-26 DIAGNOSIS — R7401 Elevation of levels of liver transaminase levels: Secondary | ICD-10-CM | POA: Diagnosis not present

## 2023-11-26 DIAGNOSIS — S161XXA Strain of muscle, fascia and tendon at neck level, initial encounter: Secondary | ICD-10-CM | POA: Insufficient documentation

## 2023-11-26 DIAGNOSIS — S199XXA Unspecified injury of neck, initial encounter: Secondary | ICD-10-CM | POA: Diagnosis present

## 2023-11-26 DIAGNOSIS — J45909 Unspecified asthma, uncomplicated: Secondary | ICD-10-CM | POA: Insufficient documentation

## 2023-11-26 DIAGNOSIS — X58XXXA Exposure to other specified factors, initial encounter: Secondary | ICD-10-CM | POA: Diagnosis not present

## 2023-11-26 DIAGNOSIS — S0083XA Contusion of other part of head, initial encounter: Secondary | ICD-10-CM | POA: Diagnosis not present

## 2023-11-26 DIAGNOSIS — R7309 Other abnormal glucose: Secondary | ICD-10-CM | POA: Insufficient documentation

## 2023-11-26 DIAGNOSIS — Z79899 Other long term (current) drug therapy: Secondary | ICD-10-CM | POA: Diagnosis not present

## 2023-11-26 DIAGNOSIS — R55 Syncope and collapse: Secondary | ICD-10-CM | POA: Insufficient documentation

## 2023-11-26 DIAGNOSIS — R569 Unspecified convulsions: Secondary | ICD-10-CM | POA: Diagnosis not present

## 2023-11-26 DIAGNOSIS — I10 Essential (primary) hypertension: Secondary | ICD-10-CM | POA: Diagnosis not present

## 2023-11-26 DIAGNOSIS — Z91040 Latex allergy status: Secondary | ICD-10-CM | POA: Diagnosis not present

## 2023-11-26 LAB — URINALYSIS, ROUTINE W REFLEX MICROSCOPIC
Bacteria, UA: NONE SEEN
Bilirubin Urine: NEGATIVE
Glucose, UA: NEGATIVE mg/dL
Hgb urine dipstick: NEGATIVE
Ketones, ur: 5 mg/dL — AB
Nitrite: NEGATIVE
Protein, ur: NEGATIVE mg/dL
Specific Gravity, Urine: 1.016 (ref 1.005–1.030)
pH: 7 (ref 5.0–8.0)

## 2023-11-26 LAB — COMPREHENSIVE METABOLIC PANEL WITH GFR
ALT: 49 U/L — ABNORMAL HIGH (ref 0–44)
AST: 76 U/L — ABNORMAL HIGH (ref 15–41)
Albumin: 3.6 g/dL (ref 3.5–5.0)
Alkaline Phosphatase: 71 U/L (ref 38–126)
Anion gap: 14 (ref 5–15)
BUN: 17 mg/dL (ref 6–20)
CO2: 27 mmol/L (ref 22–32)
Calcium: 9.5 mg/dL (ref 8.9–10.3)
Chloride: 96 mmol/L — ABNORMAL LOW (ref 98–111)
Creatinine, Ser: 1.24 mg/dL — ABNORMAL HIGH (ref 0.44–1.00)
GFR, Estimated: 56 mL/min — ABNORMAL LOW (ref >60)
Glucose, Bld: 104 mg/dL — ABNORMAL HIGH (ref 70–99)
Potassium: 4.1 mmol/L (ref 3.5–5.1)
Sodium: 137 mmol/L (ref 135–145)
Total Bilirubin: 0.6 mg/dL (ref 0.0–1.2)
Total Protein: 7.6 g/dL (ref 6.5–8.1)

## 2023-11-26 LAB — HCG, SERUM, QUALITATIVE: Preg, Serum: NEGATIVE

## 2023-11-26 LAB — TROPONIN I (HIGH SENSITIVITY)
Troponin I (High Sensitivity): 5 ng/L (ref <18)
Troponin I (High Sensitivity): 4 ng/L (ref <18)

## 2023-11-26 LAB — CBG MONITORING, ED: Glucose-Capillary: 103 mg/dL — ABNORMAL HIGH (ref 70–99)

## 2023-11-26 LAB — RAPID URINE DRUG SCREEN, HOSP PERFORMED
Amphetamines: NOT DETECTED
Barbiturates: NOT DETECTED
Benzodiazepines: NOT DETECTED
Cocaine: POSITIVE — AB
Opiates: POSITIVE — AB
Tetrahydrocannabinol: POSITIVE — AB

## 2023-11-26 LAB — CBC
HCT: 39.9 % (ref 36.0–46.0)
Hemoglobin: 12.8 g/dL (ref 12.0–15.0)
MCH: 31.7 pg (ref 26.0–34.0)
MCHC: 32.1 g/dL (ref 30.0–36.0)
MCV: 98.8 fL (ref 80.0–100.0)
Platelets: 254 K/uL (ref 150–400)
RBC: 4.04 MIL/uL (ref 3.87–5.11)
RDW: 13.3 % (ref 11.5–15.5)
WBC: 9.7 K/uL (ref 4.0–10.5)
nRBC: 0 % (ref 0.0–0.2)

## 2023-11-26 MED ORDER — ONDANSETRON HCL 4 MG/2ML IJ SOLN
4.0000 mg | Freq: Once | INTRAMUSCULAR | Status: AC
  Administered 2023-11-26: 4 mg via INTRAVENOUS
  Filled 2023-11-26: qty 2

## 2023-11-26 MED ORDER — KETOROLAC TROMETHAMINE 15 MG/ML IJ SOLN
15.0000 mg | Freq: Once | INTRAMUSCULAR | Status: AC
  Administered 2023-11-26: 15 mg via INTRAVENOUS
  Filled 2023-11-26: qty 1

## 2023-11-26 MED ORDER — MORPHINE SULFATE (PF) 4 MG/ML IV SOLN
4.0000 mg | Freq: Once | INTRAVENOUS | Status: AC
  Administered 2023-11-26: 4 mg via INTRAVENOUS
  Filled 2023-11-26: qty 1

## 2023-11-26 MED ORDER — ACETAMINOPHEN 500 MG PO TABS
1000.0000 mg | ORAL_TABLET | Freq: Once | ORAL | Status: AC
  Administered 2023-11-26: 1000 mg via ORAL
  Filled 2023-11-26: qty 2

## 2023-11-26 NOTE — ED Notes (Signed)
 Patient unaware that a urine specimen was needed and just used the bathroom

## 2023-11-26 NOTE — ED Triage Notes (Signed)
 Patient presents after syncopal episode. When she woke she noticed neck pain, bump on her head and memory issues. Denies shortness of breath, chest pain, palpitations of visual changes. She reports this being her first syncopal episode. Patient was brushing her teeth prior to this episode. She is unaware of how long she was on the floor.

## 2023-11-26 NOTE — ED Provider Notes (Signed)
 [facility] Provider Note   CSN: [id number] Arrival date & time: 11/26/23  1353     History  Chief Complaint  Patient presents with   Loss of Consciousness    Jordan Foster is a 43 y.o. female with UC,  who presents with syncopal episode. When she woke she noticed severe 10/10 midline posterior neck pain radiating to L side; bump on the crown of her head. and memory issues. She states when she woke up she had no memory of her house, and thought someone had broken in and put stuff in her house because none of it was familiar. Didn't remember who the president was. Called 911. Denies shortness of breath, chest pain, palpitations of visual changes. Never has passed out before, never has had a seizure. Patient was brushing her teeth prior to this episode, doesn't remember feeling lightheaded/dizzy, chest pain, shortness of breath; felt normal immediately prior to episode. She is unaware of how long she was on the floor. No urinary incontinence but did bite her lip. No personal cardiac history. Usually uses THC, last night did use a bump of cocaine. Last night drank 6-7 shots, which is what she drinks about every day. No leg swelling, no h/o DVT/PE; no recent travel/hospitalizations/surgeries, no hormones. Did take her blood pressure pills this AM; no recent changes to the doses.    Past Medical History:  Diagnosis Date   Asthma    Depression    Hypertension    Positive TB test        Home Medications Prior to Admission medications   Medication Sig Start Date End Date Taking? Authorizing Provider  albuterol  (VENTOLIN  HFA) 108 (90 Base) MCG/ACT inhaler Inhale 2 puffs into the lungs every 4 (four) hours as needed. 04/20/19 11/12/21  [provider]  ARIPiprazole  (ABILIFY ) 10 MG tablet Take 1 tablet (10 mg total) by mouth daily. 06/23/23 07/23/23  Massengill, Rankin, MD  budesonide -formoterol  (SYMBICORT ) 160-4.5 MCG/ACT inhaler  Inhale 2 puffs into the lungs 2 (two) times daily. 11/12/20 11/12/21  [provider]  cetirizine (ZYRTEC) 10 MG tablet Take 10 mg by mouth daily. 10/19/16   [provider]  desvenlafaxine  (PRISTIQ ) 50 MG 24 hr tablet Take 1 tablet (50 mg total) by mouth daily. Patient taking differently: Take 100 mg by mouth daily. 09/14/21 10/14/21  Massengill, Rankin, MD  gabapentin  (NEURONTIN ) 600 MG tablet Take 600 mg by mouth 4 (four) times daily. 06/15/23   [provider]  hydrOXYzine  (ATARAX ) 25 MG tablet Take 25 mg by mouth 3 (three) times daily as needed. 11/28/21   [provider]  losartan  (COZAAR ) 50 MG tablet Take 2 tablets (100 mg total) by mouth daily. 06/23/23   Massengill, Rankin, MD  nicotine  polacrilex (NICORETTE ) 2 MG gum Take 1 each (2 mg total) by mouth as needed for smoking cessation. 06/23/23   Massengill, Rankin, MD  ondansetron  (ZOFRAN ) 4 MG tablet Take 1 tablet by mouth daily. 04/05/21   [provider]  pantoprazole  (PROTONIX ) 40 MG tablet Take 40 mg by mouth daily. 08/04/21   [provider]  prazosin  (MINIPRESS ) 2 MG capsule Take 1 capsule (2 mg total) by mouth at bedtime as needed (nightmares). 06/23/23   Massengill, Rankin, MD  propranolol  ER (INDERAL  LA) 60 MG 24 hr capsule Take 1 capsule (60 mg total) by mouth daily. 09/13/21 10/13/21  Massengill, Rankin, MD  Vedolizumab 108 MG/0.68ML SOAJ Inject 300 mg into the skin every 14 (fourteen) days.  03/08/23   [provider]      Allergies    Latex    Review of Systems   Review of Systems A 10 point review of systems was performed and is negative unless otherwise reported in HPI.  Physical Exam Updated Vital Signs BP (!) 146/90 (BP Location: Right Arm)   Pulse 89   Temp 98.5 F (36.9 C) (Oral)   Resp 14   SpO2 93%  Physical Exam General: Normal appearing female, lying in bed.  HEENT: PERRLA, EOMI, Sclera anicteric, MMM, trachea midline. +small hematoma to crown of skull; no  skull depressions; No trauma noted to face.  Cardiology: RRR, no murmurs/rubs/gallops.  Resp: Normal respiratory rate and effort. CTAB, no wheezes, rhonchi, crackles.  Abd: Soft, non-tender, non-distended. No rebound tenderness or guarding.  GU: Deferred. MSK: No peripheral edema or signs of trauma. Extremities without deformity or TTP. No cyanosis or clubbing. Skin: warm, dry.  Back: No CVA tenderness. +midline C spine TTP without deformities/stepoffs.  Neuro: A&Ox4, CNs II-XII grossly intact. MAEs. Sensation grossly intact.  Psych: Normal mood and affect.   ED Results / Procedures / Treatments   Labs (all labs ordered are listed, but only abnormal results are displayed) Labs Reviewed  COMPREHENSIVE METABOLIC PANEL WITH GFR - Abnormal; Notable for the following components:      Result Value   Chloride 96 (*)    Glucose, Bld 104 (*)    Creatinine, Ser 1.24 (*)    AST 76 (*)    ALT 49 (*)    GFR, Estimated 56 (*)    All other components within normal limits  CBG MONITORING, ED - Abnormal; Notable for the following components:   Glucose-Capillary 103 (*)    All other components within normal limits  CBC  URINALYSIS, ROUTINE W REFLEX MICROSCOPIC  HCG, SERUM, QUALITATIVE  RAPID URINE DRUG SCREEN, HOSP PERFORMED  TROPONIN I (HIGH SENSITIVITY)    EKG EKG Interpretation Date/Time:  Monday November 26 2023 14:03:07 EDT Ventricular Rate:  90 PR Interval:  136 QRS Duration:  91 QT Interval:  363 QTC Calculation: 445 R Axis:   87  Text Interpretation: Sinus rhythm Probable left atrial enlargement Nonspecific T abnrm, anterolateral leads No significant change since last tracing Confirmed by Franklyn Gills [phone]) on 11/26/2023 3:15:00 PM  Radiology CTH: 1. Right parietal scalp hematoma. No acute intracranial abnormality. No skull fracture. 2. Chronic sinusitis.   CT C-spine: Multilevel degenerative change in the cervical spine without acute fracture or subluxation.  CXR: No  active cardiopulmonary disease.   Procedures Procedures    Medications Ordered in ED Medications  morphine  (PF) 4 MG/ML injection 4 mg (has no administration in time range)    ED Course/ Medical Decision Making/ A&P                          Medical Decision Making Amount and/or Complexity of Data Reviewed Labs: ordered. Decision-making details documented in ED Course. Radiology: ordered. Decision-making details documented in ED Course.  Risk OTC drugs. Prescription drug management.    This patient presents to the ED for concern of LOC, this involves an extensive number of treatment options, and is a complaint that carries with it a high risk of complications and morbidity.  I considered the following differential and admission for this acute, potentially life threatening condition.  Patient is overall hemodynamically stable and well-appearing.  MDM:    Consider syncope vs seizure, given patient's lip biting  as well as confusion when she awoke and lack of prodromal sxs. Less likely but must still consider syncope though no prodromal sxs, no lightheadedness/dizziness. No significant hypo/hyperglycemia, no electrolyte derangements. She does have mildly elevated LFTs in s/o known EtOH use, no RUQ TTP and no signs of acute hepatobiliary disease. She is placed in c-collar given neck pain but reassuringly her CT c-spine doesn't show any acute fractures.  CT head shows a right parietal scalp hematoma but no ICH or skull fracture.  Patient is currently at her baseline mental status.  She has no history of seizures.  She has no trembling or tongue fasciculations that would indicate acute alcohol  withdrawal, no history of alcohol  withdrawal seizure.  Vital signs are stable and also do not indicate withdrawal.  EKG without any arrhythmia or ischemia and troponin is negative x 2, lower concern for ACS or aortic dissection.  Urine with no signs of UTI.  UA positive for cocaine THC,  as well as opiates  which were given here.  Cocaine and THC could have contributed to patient's seizure and I recommended that she stop using both illicit substances. Advised no driving, climbing height, or getting into bodies of water and advsied f/u with neurology in 1-2 weeks, referral placed. Patient reports understanding.   On reevaluation of patient's C-spine she does have some mild pain still with rotation of the head.  She does not have any focal neurodeficits.  She states her pain is mostly in the lateral neck but I discussed with the patient that I cannot definitively rule out a ligamentous injury without an MRI of her C-spine and that in the meantime she would need to continue wearing the c-collar.  Performed shared decision-making with the patient and she states that she believes her pain is muscular and reports understanding, she will take the c-collar with her and apply it if her pain gets worse and follow-up with the neurosurgeon. Likely w/ cervical strain. Recommended tylenol /ibuprofen and heat/ice.   Clinical Course as of 12/03/23 2240  Mon Nov 26, 2023  1514 Glucose-Capillary(!): 103 [HN]  1812 ALT(!): 49 [HN]  1812 AST(!): 76 +EtOH use [HN]  1958 CT Cervical Spine Wo Contrast Multilevel degenerative change in the cervical spine without acute fracture or subluxation.    [HN]  1958 CT Head Wo Contrast 1. Right parietal scalp hematoma. No acute intracranial abnormality. No skull fracture. 2. Chronic sinusitis.   [HN]  1959 Troponin I (High Sensitivity): 4 EKG w/o ischemic signs of arrhythmia; trop neg x2 [HN]    Clinical Course User Index [HN] Franklyn Sid SAILOR, MD    Labs: I Ordered, and personally interpreted labs.  The pertinent results include:  those listed above  Imaging Studies ordered: I ordered imaging studies including CTH, C-spine I independently visualized and interpreted imaging. I agree with the radiologist interpretation  Additional history obtained from chart review, EMS.    Cardiac Monitoring: The patient was maintained on a cardiac monitor.  I personally viewed and interpreted the cardiac monitored which showed an underlying rhythm of: Normal sinus rhythm  Reevaluation: After the interventions noted above, I reevaluated the patient and found that they have :improved  Social Determinants of Health: Lives independently  Disposition:  DC w/ discharge instructions/return precautions. All questions answered to patient's satisfaction.    Co morbidities that complicate the patient evaluation  Past Medical History:  Diagnosis Date   Asthma    Depression    Hypertension    Positive TB test  Medicines Meds ordered this encounter  Medications   morphine  (PF) 4 MG/ML injection 4 mg    I have reviewed the patients home medicines and have made adjustments as needed  Problem List / ED Course: Problem List Items Addressed This Visit   None Visit Diagnoses       Seizure [facility])    -  Primary   Relevant Orders   Ambulatory referral to Neurology     Cervical strain, acute, initial encounter                       This note was created using dictation software, which may contain spelling or grammatical errors.    Franklyn Sid SAILOR, MD 12/03/23 [phone]

## 2023-11-26 NOTE — ED Notes (Signed)
 Rn able to give discharge papers an instructions, but Patient was unable to stay for a final set of vitals.

## 2023-11-26 NOTE — ED Notes (Signed)
 C collar placed to patients neck. JRPRN

## 2023-11-26 NOTE — ED Notes (Signed)
 Red top tube collected and sent to lab for recollect. JRPRN

## 2023-11-26 NOTE — ED Notes (Signed)
 Called lab and added Troponin on to labs just drawn. JRPRN

## 2023-11-26 NOTE — Discharge Instructions (Addendum)
 Thank you for coming to [facility] Emergency Department. You were seen for losing consciousness at home and waking up confused. Based on your symptoms, it is most likely that you had a seizure today. It is also possible that you passed out. You can alternate taking Tylenol  and ibuprofen as needed for pain. You can take 650mg  tylenol  (acetaminophen ) every 4-6 hours, and 600 mg ibuprofen 3 times a day.    Please follow up with a neurologist within 1 to 2 weeks.  Please also follow-up with your cardiologist this week as originally scheduled.  Please do not climb heights, get into pools of water, drive, or operate heavy machinery until following up with a neurologist.  Do not operate heavy machinery. Avoid heights. Avoid pools of water. Avoid any activity that could be dangerous if you lost consciousness. Consult with your neurologist about when you can resume these activities. In most circumstances, seizures typically last less than 2-3 minutes, and do not require any intervention, besides keeping the patient safe from injury. During the seizure, the patient should be rolled onto their side in hopes of preventing aspiration should vomiting occur. Do not place anything in the patient's mouth.  Should the seizure persist greater than 5 minutes, intervention may be needed to get the seizure to stop and 911 should be called. Also, consider calling 911 for multiple seizures, injuries or if the patient is not improving towards baseline after a seizure.  Discussed state regulations regarding driving restrictions and the need to be free of seizures (that would impair the ability to operate a vehicle safely) for at least six months before returning to driving. The patient is aware that it is their responsibility to notify the [facility] and to not drive until approved.   Do not hesitate to return to the ED or call 911 if you experience: -Worsening symptoms -Chest pain, shortness of breath -Confusion, numbness/tingling,  asymmetric weakness, visual changes, slurred speech, severe headache -Further seizure-like activity -Lightheadedness, passing out -Fevers/chills -Anything else that concerns you

## 2023-12-18 ENCOUNTER — Ambulatory Visit: Admitting: Diagnostic Neuroimaging

## 2023-12-18 ENCOUNTER — Encounter: Payer: Self-pay | Admitting: Diagnostic Neuroimaging

## 2023-12-18 VITALS — BP 150/90 | HR 99 | Ht 67.0 in | Wt 158.2 lb

## 2023-12-18 DIAGNOSIS — R55 Syncope and collapse: Secondary | ICD-10-CM

## 2023-12-18 DIAGNOSIS — R4020 Unspecified coma: Secondary | ICD-10-CM

## 2023-12-18 NOTE — Patient Instructions (Addendum)
 NEW ONSET SYNCOPE VS SEIZURE   - check MRI brain, EEG  - follow up with rehab program re: gradually reducing alcohol  use (caution with withdrawal)  - According to [place] law, you can not drive unless you are seizure / syncope free for at least 6 months and under physician's care.   - Please maintain precautions. Do not participate in activities where a loss of awareness could harm you or someone else. No swimming alone, no tub bathing, no hot tubs, no driving, no operating motorized vehicles (cars, ATVs, motocycles, etc), lawnmowers, power tools or firearms. No standing at heights, such as rooftops, ladders or stairs. Avoid hot objects such as stoves, heaters, open fires. Wear a helmet when riding a bicycle, scooter, skateboard, etc. and avoid areas of traffic. Set your water heater to 120 degrees or less.
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
## 2023-12-19 ENCOUNTER — Other Ambulatory Visit

## 2023-12-19 ENCOUNTER — Encounter: Payer: Self-pay | Admitting: Diagnostic Neuroimaging

## 2023-12-19 DIAGNOSIS — R4020 Unspecified coma: Secondary | ICD-10-CM

## 2023-12-19 DIAGNOSIS — R55 Syncope and collapse: Secondary | ICD-10-CM

## 2023-12-19 MED ORDER — GADOBENATE DIMEGLUMINE 529 MG/ML IV SOLN
15.0000 mL | Freq: Once | INTRAVENOUS | Status: AC | PRN
  Administered 2023-12-19: 15 mL via INTRAVENOUS

## 2023-12-25 ENCOUNTER — Ambulatory Visit: Payer: Self-pay | Admitting: Diagnostic Neuroimaging

## 2024-01-17 ENCOUNTER — Other Ambulatory Visit: Admitting: *Deleted

## 2024-01-17 ENCOUNTER — Encounter: Payer: Self-pay | Admitting: *Deleted

## 2024-05-14 ENCOUNTER — Other Ambulatory Visit: Payer: Self-pay

## 2024-05-14 ENCOUNTER — Emergency Department (HOSPITAL_COMMUNITY)
Admission: EM | Admit: 2024-05-14 | Discharge: 2024-05-15 | Disposition: A | Discharge status: Other Acute Inpatient Hospital | Attending: Emergency Medicine | Admitting: Emergency Medicine

## 2024-05-14 DIAGNOSIS — I10 Essential (primary) hypertension: Secondary | ICD-10-CM | POA: Insufficient documentation

## 2024-05-14 DIAGNOSIS — F4310 Post-traumatic stress disorder, unspecified: Secondary | ICD-10-CM | POA: Insufficient documentation

## 2024-05-14 DIAGNOSIS — F10188 Alcohol abuse with other alcohol-induced disorder: Secondary | ICD-10-CM | POA: Diagnosis not present

## 2024-05-14 DIAGNOSIS — Z79899 Other long term (current) drug therapy: Secondary | ICD-10-CM | POA: Diagnosis not present

## 2024-05-14 DIAGNOSIS — F1090 Alcohol use, unspecified, uncomplicated: Secondary | ICD-10-CM | POA: Diagnosis not present

## 2024-05-14 DIAGNOSIS — Y906 Blood alcohol level of 120-199 mg/100 ml: Secondary | ICD-10-CM | POA: Insufficient documentation

## 2024-05-14 DIAGNOSIS — R45851 Suicidal ideations: Secondary | ICD-10-CM | POA: Insufficient documentation

## 2024-05-14 DIAGNOSIS — Z91040 Latex allergy status: Secondary | ICD-10-CM | POA: Diagnosis not present

## 2024-05-14 DIAGNOSIS — J45909 Unspecified asthma, uncomplicated: Secondary | ICD-10-CM | POA: Diagnosis not present

## 2024-05-14 DIAGNOSIS — Z7951 Long term (current) use of inhaled steroids: Secondary | ICD-10-CM | POA: Diagnosis not present

## 2024-05-14 DIAGNOSIS — F329 Major depressive disorder, single episode, unspecified: Secondary | ICD-10-CM | POA: Diagnosis present

## 2024-05-14 LAB — CBC
HCT: 41.7 % (ref 36.0–46.0)
Hemoglobin: 13.4 g/dL (ref 12.0–15.0)
MCH: 26 pg (ref 26.0–34.0)
MCHC: 32.1 g/dL (ref 30.0–36.0)
MCV: 80.8 fL (ref 80.0–100.0)
Platelets: 449 K/uL — ABNORMAL HIGH (ref 150–400)
RBC: 5.16 MIL/uL — ABNORMAL HIGH (ref 3.87–5.11)
RDW: 14.2 % (ref 11.5–15.5)
WBC: 14.8 K/uL — ABNORMAL HIGH (ref 4.0–10.5)
nRBC: 0 % (ref 0.0–0.2)

## 2024-05-14 LAB — COMPREHENSIVE METABOLIC PANEL WITH GFR
ALT: 9 U/L (ref 0–44)
AST: 21 U/L (ref 15–41)
Albumin: 4.4 g/dL (ref 3.5–5.0)
Alkaline Phosphatase: 95 U/L (ref 38–126)
Anion gap: 12 (ref 5–15)
BUN: 19 mg/dL (ref 6–20)
CO2: 24 mmol/L (ref 22–32)
Calcium: 9.1 mg/dL (ref 8.9–10.3)
Chloride: 104 mmol/L (ref 98–111)
Creatinine, Ser: 1.14 mg/dL — ABNORMAL HIGH (ref 0.44–1.00)
GFR, Estimated: >60 mL/min
Glucose, Bld: 99 mg/dL (ref 70–99)
Potassium: 4.1 mmol/L (ref 3.5–5.1)
Sodium: 139 mmol/L (ref 135–145)
Total Bilirubin: 0.3 mg/dL (ref 0.0–1.2)
Total Protein: 7.9 g/dL (ref 6.5–8.1)

## 2024-05-14 LAB — ETHANOL: Alcohol, Ethyl (B): 185 mg/dL — ABNORMAL HIGH

## 2024-05-14 LAB — HCG, SERUM, QUALITATIVE: Preg, Serum: NEGATIVE

## 2024-05-14 NOTE — ED Notes (Signed)
 Pt changed into paper scrubs and belongings placed in locker #3. Belongings included clothing, watch, rings, necklace, phone, keys, and vape.

## 2024-05-14 NOTE — ED Notes (Signed)
 Pt wanded by security.

## 2024-05-14 NOTE — ED Notes (Signed)
 Pts pocket knife turned into security. Yellow form kept with pts belongings.

## 2024-05-14 NOTE — ED Triage Notes (Signed)
 Patient arrives via [employer] for suicidal thoughts with plan. Patient had a loaded gun that was obtained by [employer]. Patient alert oriented x4. Patient endorses using cocaine within past hour. Patient endorses drinking alcohol  as well.

## 2024-05-14 NOTE — ED Notes (Signed)
 Patient states she is currently being seen at the [facility] clinic. Per patient [facility] has to [facility] contacted within 24 hours of arrival to ED.

## 2024-05-14 NOTE — ED Notes (Signed)
[employer] at bedside 

## 2024-05-14 NOTE — ED Provider Notes (Signed)
 " [facility] Provider Note   CSN: [id number] Arrival date & time: 05/14/24  1728     Patient presents with: Suicidal  HPI Jordan Foster is a 44 y.o. female  with MDD, well, hypertension, asthma, cocaine abuse, alcohol  abuse presenting for suicidal ideation. Brought in by [employer] and patient did have a loaded gun and reported that she was planned to use it but she states I am too much of a coward to pull the trigger.  She states she has been a few months sober but did start drinking today and also used cocaine in the last hour.  She denies any other symptoms.   HPI     Prior to Admission medications  Medication Sig Start Date End Date Taking? Authorizing Provider  albuterol  (VENTOLIN  HFA) 108 (90 Base) MCG/ACT inhaler Inhale 2 puffs into the lungs every 4 (four) hours as needed. 04/20/19 12/18/23  [provider]  ALPRAZolam  (XANAX  XR) 0.5 MG 24 hr tablet Take 0.5 mg by mouth 3 (three) times daily as needed for anxiety.    [provider]  ARIPiprazole  (ABILIFY ) 10 MG tablet Take 1 tablet (10 mg total) by mouth daily. 06/23/23 12/18/23  Massengill, Rankin, MD  budesonide -formoterol  (SYMBICORT ) 160-4.5 MCG/ACT inhaler Inhale 2 puffs into the lungs 2 (two) times daily. 11/12/20 12/18/23  [provider]  cetirizine (ZYRTEC) 10 MG tablet Take 10 mg by mouth daily. 10/19/16   [provider]  desvenlafaxine  (PRISTIQ ) 50 MG 24 hr tablet Take 1 tablet (50 mg total) by mouth daily. 09/14/21 12/18/23  Massengill, Rankin, MD  gabapentin  (NEURONTIN ) 600 MG tablet Take 600 mg by mouth 4 (four) times daily. 06/15/23   [provider]  hydrOXYzine  (ATARAX ) 25 MG tablet Take 25 mg by mouth 3 (three) times daily as needed. 11/28/21   [provider]  losartan  (COZAAR ) 50 MG tablet Take 2 tablets (100 mg total) by mouth daily. 06/23/23   Massengill, Rankin, MD  pantoprazole  (PROTONIX ) 40 MG tablet Take 40 mg by mouth  daily. 08/04/21   [provider]  prazosin  (MINIPRESS ) 2 MG capsule Take 1 capsule (2 mg total) by mouth at bedtime as needed (nightmares). 06/23/23   Massengill, Rankin, MD  promethazine  (PHENERGAN ) 25 MG tablet Take 25 mg by mouth every 6 (six) hours as needed for nausea or vomiting (N/V).    [provider]  propranolol  ER (INDERAL  LA) 60 MG 24 hr capsule Take 1 capsule (60 mg total) by mouth daily. Patient taking differently: Take 60 mg by mouth 2 (two) times daily. 09/13/21 12/18/23  Massengill, Rankin, MD  Vedolizumab 108 MG/0.68ML SOAJ Inject 300 mg into the skin every 14 (fourteen) days. 03/08/23   [provider]    Allergies: Latex, Adalimumab, and Pollen extract    Review of Systems  Updated Vital Signs BP (!) 125/92   Pulse 85   Temp 98.5 F (36.9 C) (Oral)   Resp 18   Ht 5' 7 (1.702 m)   Wt 70.3 kg   SpO2 100%   BMI 24.28 kg/m   Physical Exam Vitals and nursing note reviewed.  HENT:     Head: Normocephalic and atraumatic.     Mouth/Throat:     Mouth: Mucous membranes are moist.  Eyes:     General:        Right eye: No discharge.        Left eye: No discharge.     Conjunctiva/sclera: Conjunctivae normal.  Cardiovascular:     Rate and Rhythm: Normal rate and regular rhythm.     Pulses: Normal pulses.     Heart sounds: Normal heart sounds.  Pulmonary:     Effort: Pulmonary effort is normal.     Breath sounds: Normal breath sounds.  Abdominal:     General: Abdomen is flat.     Palpations: Abdomen is soft.  Skin:    General: Skin is warm and dry.  Neurological:     General: No focal deficit present.  Psychiatric:        Attention and Perception: Attention and perception normal.        Mood and Affect: Mood and affect normal.        Speech: Speech normal.        Behavior: Behavior normal.        Thought Content: Thought content normal.        Cognition and Memory: Cognition and memory normal.        Judgment: Judgment normal.      (all labs ordered are listed, but only abnormal results are displayed) Labs Reviewed  COMPREHENSIVE METABOLIC PANEL WITH GFR - Abnormal; Notable for the following components:      Result Value   Creatinine, Ser 1.14 (*)    All other components within normal limits  ETHANOL - Abnormal; Notable for the following components:   Alcohol , Ethyl (B) 185 (*)    All other components within normal limits  CBC - Abnormal; Notable for the following components:   WBC 14.8 (*)    RBC 5.16 (*)    Platelets 449 (*)    All other components within normal limits  HCG, SERUM, QUALITATIVE  URINE DRUG SCREEN    EKG: None  Radiology: No results found.   Procedures   Medications Ordered in the ED - No data to display                                  Medical Decision Making Amount and/or Complexity of Data Reviewed Labs: ordered.   44 year old well-appearing female presenting for suicidal ideation with a plan.  Exam was unremarkable.  She was pleasant and cooperative and seems that she wants help.  However given the concerning nature of the story with clear plan with load again to potentially end her life today prompted concern that she was potentially a harm to herself or others.  Placed her in IVC status.  Clinically she looks well.  Labs were largely unremarkable.  WBC was 14.8.  On serial reassessments denies any symptoms.  Placed her in psych hold.  She is effectively medically cleared at this time.  Awaiting TTS evaluation.  Still under IVC status.     Final diagnoses:  Suicidal ideation    ED Discharge Orders     None          Lang Norleen POUR, PA-C 05/14/24 2015    Tegeler, Lonni PARAS, MD 05/14/24 2315  "

## 2024-05-15 ENCOUNTER — Inpatient Hospital Stay
Admission: AD | Admit: 2024-05-15 | Discharge: 2024-05-20 | DRG: 885 | Disposition: A | Discharge status: Home / Self Care | Source: Intra-hospital | Attending: Psychiatry | Admitting: Psychiatry

## 2024-05-15 ENCOUNTER — Encounter: Payer: Self-pay | Admitting: Psychiatry

## 2024-05-15 DIAGNOSIS — Z9151 Personal history of suicidal behavior: Secondary | ICD-10-CM

## 2024-05-15 DIAGNOSIS — Z79899 Other long term (current) drug therapy: Secondary | ICD-10-CM

## 2024-05-15 DIAGNOSIS — F332 Major depressive disorder, recurrent severe without psychotic features: Principal | ICD-10-CM | POA: Diagnosis present

## 2024-05-15 DIAGNOSIS — F1090 Alcohol use, unspecified, uncomplicated: Secondary | ICD-10-CM | POA: Diagnosis not present

## 2024-05-15 DIAGNOSIS — Z7951 Long term (current) use of inhaled steroids: Secondary | ICD-10-CM

## 2024-05-15 DIAGNOSIS — F4310 Post-traumatic stress disorder, unspecified: Secondary | ICD-10-CM

## 2024-05-15 DIAGNOSIS — F419 Anxiety disorder, unspecified: Secondary | ICD-10-CM | POA: Diagnosis present

## 2024-05-15 DIAGNOSIS — I10 Essential (primary) hypertension: Secondary | ICD-10-CM | POA: Diagnosis present

## 2024-05-15 DIAGNOSIS — R4587 Impulsiveness: Secondary | ICD-10-CM | POA: Diagnosis present

## 2024-05-15 DIAGNOSIS — Z8249 Family history of ischemic heart disease and other diseases of the circulatory system: Secondary | ICD-10-CM

## 2024-05-15 DIAGNOSIS — F329 Major depressive disorder, single episode, unspecified: Secondary | ICD-10-CM

## 2024-05-15 DIAGNOSIS — R45851 Suicidal ideations: Secondary | ICD-10-CM | POA: Diagnosis present

## 2024-05-15 DIAGNOSIS — F1010 Alcohol abuse, uncomplicated: Secondary | ICD-10-CM | POA: Diagnosis present

## 2024-05-15 DIAGNOSIS — Z9185 Personal history of military service: Secondary | ICD-10-CM

## 2024-05-15 DIAGNOSIS — F1410 Cocaine abuse, uncomplicated: Secondary | ICD-10-CM | POA: Diagnosis present

## 2024-05-15 DIAGNOSIS — F17290 Nicotine dependence, other tobacco product, uncomplicated: Secondary | ICD-10-CM | POA: Diagnosis present

## 2024-05-15 MED ORDER — ALUM & MAG HYDROXIDE-SIMETH 200-200-20 MG/5ML PO SUSP: 30.0000 mL | ORAL | Status: DC | PRN

## 2024-05-15 MED ORDER — LOSARTAN POTASSIUM 50 MG PO TABS
100.0000 mg | ORAL_TABLET | Freq: Every day | ORAL | Status: DC
  Administered 2024-05-15: 100 mg via ORAL
  Filled 2024-05-15: qty 2

## 2024-05-15 MED ORDER — GABAPENTIN 400 MG PO CAPS
800.0000 mg | ORAL_CAPSULE | ORAL | Status: DC
  Administered 2024-05-15: 800 mg via ORAL
  Filled 2024-05-15: qty 8

## 2024-05-15 MED ORDER — HALOPERIDOL LACTATE 5 MG/ML IJ SOLN: 10.0000 mg | Freq: Three times a day (TID) | INTRAMUSCULAR | Status: DC | PRN

## 2024-05-15 MED ORDER — ALPRAZOLAM 0.25 MG PO TABS
0.5000 mg | ORAL_TABLET | Freq: Three times a day (TID) | ORAL | Status: DC
  Administered 2024-05-15: 0.5 mg via ORAL
  Filled 2024-05-15: qty 1

## 2024-05-15 MED ORDER — DIPHENHYDRAMINE HCL 50 MG/ML IJ SOLN: 50.0000 mg | Freq: Three times a day (TID) | INTRAMUSCULAR | Status: DC | PRN

## 2024-05-15 MED ORDER — LORAZEPAM 2 MG/ML IJ SOLN: 2.0000 mg | Freq: Three times a day (TID) | INTRAMUSCULAR | Status: DC | PRN

## 2024-05-15 MED ORDER — NICOTINE 21 MG/24HR TD PT24
21.0000 mg | MEDICATED_PATCH | Freq: Every day | TRANSDERMAL | Status: DC
  Administered 2024-05-16 – 2024-05-19 (×4): 21 mg via TRANSDERMAL
  Filled 2024-05-15 (×4): qty 1

## 2024-05-15 MED ORDER — NICOTINE POLACRILEX 2 MG MT GUM
2.0000 mg | CHEWING_GUM | OROMUCOSAL | Status: DC | PRN
  Administered 2024-05-15 – 2024-05-20 (×10): 2 mg via ORAL
  Filled 2024-05-15 (×12): qty 1

## 2024-05-15 MED ORDER — PROPRANOLOL HCL ER 60 MG PO CP24
60.0000 mg | ORAL_CAPSULE | Freq: Every day | ORAL | Status: DC
  Administered 2024-05-16 – 2024-05-20 (×5): 60 mg via ORAL
  Filled 2024-05-15 (×5): qty 1

## 2024-05-15 MED ORDER — GABAPENTIN 400 MG PO CAPS
800.0000 mg | ORAL_CAPSULE | ORAL | Status: DC
  Administered 2024-05-16 – 2024-05-20 (×9): 800 mg via ORAL
  Filled 2024-05-15 (×10): qty 2

## 2024-05-15 MED ORDER — ARIPIPRAZOLE 5 MG PO TABS
5.0000 mg | ORAL_TABLET | Freq: Every day | ORAL | Status: DC
  Administered 2024-05-15 – 2024-05-19 (×5): 5 mg via ORAL
  Filled 2024-05-15 (×5): qty 1

## 2024-05-15 MED ORDER — ARIPIPRAZOLE 10 MG PO TABS: 5.0000 mg | ORAL_TABLET | Freq: Every day | ORAL | Status: DC

## 2024-05-15 MED ORDER — PANTOPRAZOLE SODIUM 40 MG PO TBEC
40.0000 mg | DELAYED_RELEASE_TABLET | Freq: Every day | ORAL | Status: DC
  Administered 2024-05-15: 40 mg via ORAL
  Filled 2024-05-15: qty 1

## 2024-05-15 MED ORDER — FLUTICASONE FUROATE-VILANTEROL 200-25 MCG/ACT IN AEPB
1.0000 | INHALATION_SPRAY | Freq: Every day | RESPIRATORY_TRACT | Status: DC
  Administered 2024-05-17 – 2024-05-19 (×3): 1 via RESPIRATORY_TRACT
  Filled 2024-05-15 (×2): qty 28

## 2024-05-15 MED ORDER — LORATADINE 10 MG PO TABS
10.0000 mg | ORAL_TABLET | Freq: Every day | ORAL | Status: DC
  Administered 2024-05-16 – 2024-05-20 (×5): 10 mg via ORAL
  Filled 2024-05-15 (×6): qty 1

## 2024-05-15 MED ORDER — HALOPERIDOL 5 MG PO TABS: 5.0000 mg | ORAL_TABLET | Freq: Three times a day (TID) | ORAL | Status: DC | PRN

## 2024-05-15 MED ORDER — ALPRAZOLAM 0.5 MG PO TABS
0.5000 mg | ORAL_TABLET | Freq: Three times a day (TID) | ORAL | Status: DC
  Administered 2024-05-15 – 2024-05-20 (×15): 0.5 mg via ORAL
  Filled 2024-05-15 (×15): qty 1

## 2024-05-15 MED ORDER — LOSARTAN POTASSIUM 50 MG PO TABS
100.0000 mg | ORAL_TABLET | Freq: Every day | ORAL | Status: DC
  Administered 2024-05-16 – 2024-05-20 (×5): 100 mg via ORAL
  Filled 2024-05-15 (×5): qty 2

## 2024-05-15 MED ORDER — LORAZEPAM 1 MG PO TABS: 1.0000 mg | ORAL_TABLET | Freq: Three times a day (TID) | ORAL | Status: DC | PRN

## 2024-05-15 MED ORDER — ALBUTEROL SULFATE HFA 108 (90 BASE) MCG/ACT IN AERS: 2.0000 | INHALATION_SPRAY | RESPIRATORY_TRACT | Status: DC | PRN

## 2024-05-15 MED ORDER — HYDROXYZINE HCL 25 MG PO TABS
25.0000 mg | ORAL_TABLET | ORAL | Status: AC
  Administered 2024-05-15: 25 mg via ORAL
  Filled 2024-05-15: qty 1

## 2024-05-15 MED ORDER — GABAPENTIN 400 MG PO CAPS: 1200.0000 mg | ORAL_CAPSULE | Freq: Every day | ORAL | Status: DC

## 2024-05-15 MED ORDER — GABAPENTIN 400 MG PO CAPS
1200.0000 mg | ORAL_CAPSULE | Freq: Every day | ORAL | Status: DC
  Administered 2024-05-15 – 2024-05-19 (×5): 1200 mg via ORAL
  Filled 2024-05-15 (×5): qty 3

## 2024-05-15 MED ORDER — VENLAFAXINE HCL ER 75 MG PO CP24
150.0000 mg | ORAL_CAPSULE | Freq: Every day | ORAL | Status: DC
  Administered 2024-05-17 – 2024-05-20 (×4): 150 mg via ORAL
  Filled 2024-05-15 (×5): qty 2

## 2024-05-15 MED ORDER — PROPRANOLOL HCL ER 60 MG PO CP24
60.0000 mg | ORAL_CAPSULE | Freq: Every day | ORAL | Status: DC
  Administered 2024-05-15: 60 mg via ORAL
  Filled 2024-05-15: qty 1

## 2024-05-15 MED ORDER — PANTOPRAZOLE SODIUM 40 MG PO TBEC
40.0000 mg | DELAYED_RELEASE_TABLET | Freq: Every day | ORAL | Status: DC
  Administered 2024-05-16 – 2024-05-20 (×5): 40 mg via ORAL
  Filled 2024-05-15 (×5): qty 1

## 2024-05-15 MED ORDER — OLANZAPINE 10 MG PO TABS: 5.0000 mg | ORAL_TABLET | Freq: Three times a day (TID) | ORAL | Status: DC | PRN

## 2024-05-15 MED ORDER — VENLAFAXINE HCL ER 75 MG PO CP24: 150.0000 mg | ORAL_CAPSULE | Freq: Every day | ORAL | Status: DC

## 2024-05-15 MED ORDER — OLANZAPINE 10 MG IM SOLR: 5.0000 mg | Freq: Three times a day (TID) | INTRAMUSCULAR | Status: DC | PRN

## 2024-05-15 MED ORDER — ACETAMINOPHEN 325 MG PO TABS: 650.0000 mg | ORAL_TABLET | Freq: Four times a day (QID) | ORAL | Status: DC | PRN

## 2024-05-15 MED ORDER — DIPHENHYDRAMINE HCL 25 MG PO CAPS: 50.0000 mg | ORAL_CAPSULE | Freq: Three times a day (TID) | ORAL | Status: DC | PRN

## 2024-05-15 MED ORDER — LORAZEPAM 2 MG/ML IJ SOLN: 1.0000 mg | Freq: Three times a day (TID) | INTRAMUSCULAR | Status: DC | PRN

## 2024-05-15 MED ORDER — GABAPENTIN 800 MG PO TABS: 800.0000 mg | ORAL_TABLET | ORAL | Status: DC

## 2024-05-15 MED ORDER — PRAZOSIN HCL 2 MG PO CAPS
5.0000 mg | ORAL_CAPSULE | Freq: Every evening | ORAL | Status: DC | PRN
  Administered 2024-05-15 – 2024-05-16 (×2): 5 mg via ORAL
  Filled 2024-05-15 (×2): qty 1

## 2024-05-15 MED ORDER — HALOPERIDOL LACTATE 5 MG/ML IJ SOLN: 5.0000 mg | Freq: Three times a day (TID) | INTRAMUSCULAR | Status: DC | PRN

## 2024-05-15 MED ORDER — MAGNESIUM HYDROXIDE 400 MG/5ML PO SUSP: 30.0000 mL | Freq: Every day | ORAL | Status: DC | PRN

## 2024-05-15 NOTE — ED Notes (Signed)
 Patient seen by [facility] in the past and requests to return there for residential treatment. Please contact Buffy Russell at [phone] to initiate the process.

## 2024-05-15 NOTE — Progress Notes (Signed)
" °   05/15/24 1900  Charting Type  Charting Type Admission  Safety Check Verification  Has the RN verified the 15 minute safety check completion? Yes  Neurological  Neuro (WDL) WDL  HEENT  HEENT (WDL) X (patient has multiple ear piercings and a septum piercing)  Respiratory  Respiratory (WDL) X (hx. Asthma)  Cardiac  Cardiac (WDL) X (HTN)  Vascular  Vascular (WDL) X (hx. HTN)  Integumentary  Integumentary (WDL) X  Staff Member Assisting with Skin Assessment on Admission Ada, MHT  Skin Color Appropriate for ethnicity  Skin Condition Dry  Skin Integrity Intact;Other (Comment) (multiple tattoos to bilateral arms and legs)  Skin Turgor Non-tenting  Braden Scale (Ages 8 and up)  Sensory Perceptions 4  Moisture 4  Activity 4  Mobility 4  Nutrition 3  Friction and Shear 3  Braden Scale Score 22  Musculoskeletal  Musculoskeletal (WDL) WDL  Gastrointestinal  Gastrointestinal (WDL) WDL  Last BM Date  05/14/24  GU Assessment  Genitourinary (WDL) WDL  Genitalia  Female Genitalia Intact  Neurological  Level of Consciousness Alert    "

## 2024-05-15 NOTE — Consult Note (Cosign Needed Addendum)
 [facility] Consult Note  Patient Name: VENISSA NAPPI MRN: [id number] DOB: 1980/07/23 DATE OF Consult: 05/15/2024 Consult Order details:  Orders (From admission, onward)     Start     Ordered   05/14/24 2014  CONSULT TO CALL ACT TEAM       Ordering Provider: Lang Norleen POUR, PA-C  Provider:  (Not yet assigned)  Question:  Reason for Consult?  Answer:  Psych consult   05/14/24 2014            PRIMARY PSYCHIATRIC DIAGNOSES  1.  Major depressive disorder 2.  Alcohol  use disorder 3.  Post-traumatic stress disorder  RECOMMENDATIONS  Formulation: The patient is an adult woman with a history of PTSD related to [employer], MDD with comorbid substance use, anxiety, TBI, and prior suicide attempts, who presented following an acute emotional crisis triggered by a recent romantic breakup. This stressor led to relapse on alcohol  after 138 days of sobriety and culminated in a suicide attempt involving a firearm. At evaluation, she denied current suicidal ideation and demonstrated some future-oriented thinking; however, her presentation is concerning given the recent attempt, substance relapse, limited coping under acute stress, prior attempts, and psychiatric co-morbidities.  Admit to inpatient psych for safety and stabilization.  Medication recommendations:  Please reconcile patient's home medications and continue them as prescribed. Initiate olanzapine  (Zyprexa ) 5 mg PO or IM and lorazepam  1 mg PO or IM every 8 hours as needed for agitation. Oral administration should be offered first; intramuscular administration may be used if the patient is unable to tolerate or refuses oral medication. Olanzapine  and lorazepam  should be administered at least one hour apart. Do not exceed a total daily dose of 20 mg of olanzapine  within a 24-hour period. Discontinue all antipsychotic and QTc-prolonging medications if the patient's QTc exceeds 500 ms.  Non-Medication/therapeutic  recommendations: Individual psychotherapy focused on coping skills, emotion regulation, and crisis management Safety planning and ongoing suicide risk assessment Support for relapse prevention and sobriety maintenance Family/collateral involvement as appropriate for safety and support Structured milieu participation and engagement in therapeutic activities  Communication: Treatment team members (and family members if applicable) who were involved in treatment/care discussions and planning, and with whom we spoke or engaged with via secure text/chat, include the following: patient's treatment team.  I personally spent a total of 60 minutes in the care of the patient today including preparing to see the patient, counseling and educating, placing orders, referring and communicating with other health care professionals, documenting clinical information in the EHR, and performing psych eval.  Thank you for involving us  in the care of this patient. If you have any additional questions or concerns, please call [phone] and ask for me or the provider on-call.  TELEPSYCHIATRY ATTESTATION & CONSENT  As the provider for this telehealth consult, I attest that I verified the patients identity using two separate identifiers, introduced myself to the patient, provided my credentials, disclosed my location, and performed this encounter via a HIPAA-compliant, real-time, face-to-face, two-way, interactive audio and video platform and with the full consent and agreement of the patient (or guardian as applicable.)  Patient physical location: [facility]. Telehealth provider physical location: home office in state of [place].  Video start time: 0100 (Central Time) Video end time: 0115 (Central Time)  IDENTIFYING DATA  Avelina A Matte is a 44 y.o. year-old female for whom a psychiatric consultation has been ordered by the primary provider. The patient was identified using two separate identifiers.  CHIEF  COMPLAINT/REASON FOR CONSULT  I relapsed on my sobriety. I was a hundred and thirty eight days sober. So I drank and I became emotional and thought it would be a good idea to try and kill myself. But I don't feel that way anymore.  HISTORY OF PRESENT ILLNESS (HPI)  Per ER note, Joleigh A Blando is a 44 y.o. female  with MDD, well, hypertension, asthma, cocaine abuse, alcohol  abuse presenting for suicidal ideation. Brought in by [facility] and patient did have a loaded gun and reported that she was planned to use it but she states I am too much of a coward to pull the trigger.  She states she has been a few months sober but did start drinking today and also used cocaine in the last hour.  She denies any other symptoms.   During interview, patient reports she presented following an acute emotional crisis precipitated by a breakup with her girlfriend, who chose to reconcile with her ex-partner. On the day of the breakup, the ex-girlfriend came to the patient's residence to collect belongings, which further exacerbated her distress. In response to these events, a relapse in sobriety occurred after maintaining approximately 138 days of abstinence from alcohol . She consumed alcohol  and subsequently experienced intense emotional turmoil, leading to a suicide attempt by loading a gun. 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 She articulated future-oriented thinking, referencing obligations such as work, legal matters, and pet care.  A prior history of suicide attempt was acknowledged, with an incident in 2018 involving an overdose of pills and cocaine. She has a documented history of traumatic brain injury, post-traumatic stress disorder (PTSD) related to [employer], major depressive disorder with comorbid substance use, and anxiety disorder.  Previous psychiatric hospitalizations were reported, including a voluntary admission for suicidal ideation within the past year. She was recently discharged from a rehabilitation program in October and had maintained sobriety until the current relapse.  Her current medication regimen includes prazosin , aripiprazole , desvenlafaxine , propranolol , losartan , trazodone , gabapentin , hydroxyzine , alprazolam , and inhalers (Symbicort , albuterol ), with the last pharmacy refill occurring a few weeks prior though she was unable to state doses of her medications. She denied current access to firearms, as her father had removed her gun from the home. Family history is notable for maternal depression.  With the patients consent, collateral contact was made with her father. Information obtained contributed to concerns regarding the patients safety if discharged.   PAST PSYCHIATRIC HISTORY  - Diagnosed with major depressive disorder with substance abuse, PTSD (related to [employer]), anxiety, and traumatic brain injury. Previous suicide attempt in 2018 by overdose and cocaine use. Last psychiatric admission was last year for suicidal ideation, self-admitted to a behavioral health unit. - Completed a recent rehab program, discharged in October, for substance use. Maintained sobriety for approximately 138 days prior to current relapse. Otherwise as per HPI above.  PAST MEDICAL HISTORY  Past Medical History:  Diagnosis Date   Asthma    Depression    Hypertension    Positive TB test      HOME MEDICATIONS  Facility Ordered Medications  Medication   [COMPLETED] hydrOXYzine  (ATARAX ) tablet 25 mg   PTA Medications  Medication Sig   cetirizine (ZYRTEC) 10 MG tablet Take 10 mg by mouth daily.   pantoprazole  (PROTONIX ) 40 MG tablet Take 40 mg by mouth  daily.   albuterol  (VENTOLIN  HFA) 108 (90 Base) MCG/ACT inhaler Inhale 2 puffs into the lungs every 4 (four) hours as needed.   budesonide -formoterol  (SYMBICORT )  160-4.5 MCG/ACT inhaler Inhale 2 puffs into the lungs 2 (two) times daily.   propranolol  ER (INDERAL  LA) 60 MG 24 hr capsule Take 1 capsule (60 mg total) by mouth daily. (Patient taking differently: Take 60 mg by mouth 2 (two) times daily.)   Vedolizumab 108 MG/0.68ML SOAJ Inject 300 mg into the skin every 14 (fourteen) days.   promethazine (PHENERGAN) 25 MG tablet Take 25 mg by mouth every 6 (six) hours as needed for nausea or vomiting (N/V).   ARIPiprazole  (ABILIFY ) 5 MG tablet Take 5 mg by mouth at bedtime.   gabapentin  (NEURONTIN ) 800 MG tablet Take 800-1,200 mg by mouth See admin instructions. Take 800 mg by mouth 2 times daily and take 1200 mg every evening   losartan  (COZAAR ) 100 MG tablet Take 100 mg by mouth daily.     ALLERGIES  Allergies[1]  SOCIAL & SUBSTANCE USE HISTORY  Social History   Socioeconomic History   Marital status: Single    Spouse name: Not on file   Number of children: 0   Years of education: 16   Highest education level: Not on file  Occupational History   Occupation: [employer]   Tobacco Use   Smoking status: Never   Smokeless tobacco: Never  Vaping Use   Vaping status: Every Day   Substances: Nicotine   Substance and Sexual Activity   Alcohol  use: Yes    Alcohol /week: 35.0 standard drinks of alcohol     Types: 35 Shots of liquor per week    Comment: 10 - 12 shots/daily   Drug use: Not Currently    Types: Other-see comments    Comment: THC drink daily for sleep   Sexual activity: Yes    Comment: W/ Female Partner  Other Topics Concern   Not on file  Social History Narrative   Fun/Hobby: Working to determine   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[employer]   Tobacco Use: Low Risk (12/26/2023)   Received from [facility]   Patient History    Passive Exposure: Not on file    Smokeless Tobacco Use: Never    Smoking Tobacco Use: Never  Financial Resource Strain: Low Risk (05/02/2023)   Received from [facility]   Overall Financial  Resource Strain (CARDIA)    Difficulty of Paying Living Expenses: Not hard at all  Food Insecurity: No Food Insecurity (06/21/2023)   Hunger Vital Sign    Worried About Running Out of Food in the Last Year: Never true    Ran Out of Food in the Last Year: Never true  Transportation Needs: No Transportation Needs (06/21/2023)   PRAPARE - Administrator, Civil Service (Medical): No    Lack of Transportation (Non-Medical): No  Physical Activity: Not on file  Stress: Not on file  Social Connections: Not on file  Depression (PHQ2-9): High Risk (09/03/2021)   Depression (PHQ2-9)    PHQ-2 Score: 19  Alcohol  Screen: High Risk (06/21/2023)   Alcohol  Screen    Last Alcohol  Screening Score (AUDIT): 29  Housing: Low Risk (06/21/2023)   Housing Stability Vital Sign    Unable to Pay for Housing in the Last Year: No    Number of Times Moved in the Last Year: 0    Homeless in the Last Year: No  Utilities: Not At Risk (06/21/2023)  AHC Utilities    Threatened with loss of utilities: No  Health Literacy: Not on file   Tobacco Use History[2] Social History   Substance and Sexual Activity  Alcohol  Use Yes   Alcohol /week: 35.0 standard drinks of alcohol    Types: 35 Shots of liquor per week   Comment: 10 - 12 shots/daily   Social History   Substance and Sexual Activity  Drug Use Not Currently   Types: Other-see comments   Comment: THC drink daily for sleep    Additional pertinent information .  FAMILY HISTORY  Family History  Problem Relation Age of Onset   Hypertension Mother    Family Psychiatric History (if known):  Mother suffers from depression.  MENTAL STATUS EXAM (MSE)  Mental Status Exam: General Appearance: Well Groomed  Orientation:  Full (Time, Place, and Person)  Memory:  intact  Concentration:  intact  Recall:  intact  Attention  Fair  Eye Contact:  Fair  Speech:  Clear and Coherent  Language:  Good  Volume:  Normal  Mood: I'm not suicidal. I have things  I have to do.  Affect:  irritable  Thought Process:  Goal Directed and Linear  Thought Content:  There is no mention of auditory hallucinations, visual hallucinations, paranoia, obsessive thoughts, delusional thoughts, or intrusive thoughts.  Suicidal Thoughts:  No  Homicidal Thoughts:  No  Judgement:  Poor  Insight:  Lacking  Psychomotor Activity:  Normal  Akathisia:  Negative  Fund of Knowledge:  Good    Assets:  Communication Skills Desire for Improvement Housing Social Support  Cognition:  WNL  ADL's:  Intact  AIMS (if indicated):       VITALS  Blood pressure (!) 125/92, pulse 85, temperature 98.5 F (36.9 C), temperature source Oral, resp. rate 18, height 5' 7 (1.702 m), weight 70.3 kg, SpO2 100%.  LABS  Admission on 05/14/2024  Component Date Value Ref Range Status   Sodium 05/14/2024 139  135 - 145 mmol/L Final   Potassium 05/14/2024 4.1  3.5 - 5.1 mmol/L Final   Chloride 05/14/2024 104  98 - 111 mmol/L Final   CO2 05/14/2024 24  22 - 32 mmol/L Final   Glucose, Bld 05/14/2024 99  70 - 99 mg/dL Final   Glucose reference range applies only to samples taken after fasting for at least 8 hours.   BUN 05/14/2024 19  6 - 20 mg/dL Final   Creatinine, Ser 05/14/2024 1.14 (H)  0.44 - 1.00 mg/dL Final   Calcium 98/78/7973 9.1  8.9 - 10.3 mg/dL Final   Total Protein 98/78/7973 7.9  6.5 - 8.1 g/dL Final   Albumin 98/78/7973 4.4  3.5 - 5.0 g/dL Final   AST 98/78/7973 21  15 - 41 U/L Final   ALT 05/14/2024 9  0 - 44 U/L Final   Alkaline Phosphatase 05/14/2024 95  38 - 126 U/L Final   Total Bilirubin 05/14/2024 0.3  0.0 - 1.2 mg/dL Final   GFR, Estimated 05/14/2024 >60  >60 mL/min Final   Comment: (NOTE) Calculated using the CKD-EPI Creatinine Equation (2021)    Anion gap 05/14/2024 12  5 - 15 Final   Performed at [facility], [address], [place], [place] [address]   Alcohol , Ethyl (B) 05/14/2024 185 (H)  <15 mg/dL Final   Comment: (NOTE) For medical purposes  only. Performed at [facility], [address], [place], [place] [address]    WBC 05/14/2024 14.8 (H)  4.0 - 10.5  K/uL Final   RBC 05/14/2024 5.16 (H)  3.87 - 5.11 MIL/uL Final   Hemoglobin 05/14/2024 13.4  12.0 - 15.0 g/dL Final   HCT 98/78/7973 41.7  36.0 - 46.0 % Final   MCV 05/14/2024 80.8  80.0 - 100.0 fL Final   MCH 05/14/2024 26.0  26.0 - 34.0 pg Final   MCHC 05/14/2024 32.1  30.0 - 36.0 g/dL Final   RDW 98/78/7973 14.2  11.5 - 15.5 % Final   Platelets 05/14/2024 449 (H)  150 - 400 K/uL Final   nRBC 05/14/2024 0.0  0.0 - 0.2 % Final   Performed at [facility], [address] [place], [place] [address]   Preg, Serum 05/14/2024 NEGATIVE  NEGATIVE Final   Comment:        THE SENSITIVITY OF THIS METHODOLOGY IS >10 mIU/mL. Performed at [facility], [address], [place], [place] [address]     PSYCHIATRIC REVIEW OF SYSTEMS (ROS)   - Recent relapse on alcohol  after 138 days of sobriety. - Acute suicidal ideation last night with a loaded gun, currently denies ongoing suicidal intent. - History of major depressive disorder, PTSD, anxiety, and substance use disorder. - No current psychotic symptoms reported. - States she is thinking clearly and wants to return to work and care for her dog.   Additional findings:      Musculoskeletal: No abnormal movements observed      Gait & Station: Laying/Sitting      Pain Screening: Denies      Nutrition & Dental Concerns: none reported  RISK FORMULATION/ASSESSMENT  Is the patient experiencing any suicidal or homicidal ideations: No       Explain if yes:  Protective factors considered for safety management: include employment, sense of responsibility for her dog, supportive relationship with her father  Risk factors/concerns considered for safety management: include recent breakup, history of substance use disorder, prior suicide attempt (2018), traumatic brain injury, PTSD from [employer], major depressive  disorder, anxiety, and family history of depression. Modifiable risk factors include recent alcohol  relapse, acute emotional distress, and prior access to a firearm (now removed by her father).  Is there a safety management plan with the patient and treatment team to minimize risk factors and promote protective factors: Yes           Explain: admit to psych Is crisis care placement or psychiatric hospitalization recommended: Yes     Based on my current evaluation and risk assessment, patient is determined at this time to be at:  High risk  *RISK ASSESSMENT Risk assessment is a dynamic process; it is possible that this patient's condition, and risk level, may change. This should be re-evaluated and managed over time as appropriate. Please re-consult psychiatric consult services if additional assistance is needed in terms of risk assessment and management. If your team decides to discharge this patient, please advise the patient how to best access emergency psychiatric services, or to call 911, if their condition worsens or they feel unsafe in any way.   Izumi Mixon Velna, NP Telepsychiatry Consult Services     [1]  Allergies Allergen Reactions   Latex Itching   Humira (1 Pen) [Adalimumab] Other (See Comments)    Abnormal vision, Muscle pain, Fatigue, Headache   Pollen Extract     Sneezing, headache  [2]  Social History Tobacco Use  Smoking Status Never  Smokeless Tobacco Never

## 2024-05-15 NOTE — Progress Notes (Signed)
 Pt was accepted to [facility] TODAY 05/15/2024 Bed assignment: [id number]  Pt meets inpatient criteria per: Ines Hock NP  Attending Physician will be Nels MD  Report can be called to: [phone]  Pt can arrive after: RN WILL UPDATE   Care Team Notified: Cherylynn Ernst RN, Jerel Gravely NP  Tunisia Jewelz Ricklefs LCSW  05/15/2024 9:00 AM

## 2024-05-15 NOTE — ED Notes (Signed)
 PAPERWORK CURRENT AND COMPLETE LOCATED IN ORANGE ZONE ATTACHED TO THE CLIPBOARD

## 2024-05-15 NOTE — ED Notes (Signed)
 IVC CORRECT AND RE UPLOADED AND SENT THRU EFILE WAITING FOR MAGISTRATE TO OPEN AND APPROVE PAPERWORK TO COMPLETE THE PROCESS PAPER IN THE ORANGE ZONE ATTACHED TO THE CLIP BOARD

## 2024-05-15 NOTE — Group Note (Signed)
 Recreation Therapy Group Note   Group Topic:Relaxation  Group Date: 05/15/2024 Start Time: 1530 End Time: 1605 Facilitators: Celestia Jeoffrey BRAVO, LRT , CTRS Location: Dayroom  Group Description: PMR (Progressive Muscle Relaxation). LRT educates patients on what PMR is and the benefits that come from it. Patients are asked to sit with their feet flat on the floor while sitting up and all the way back in their chair, if possible. LRT and pts follow a prompt through a speaker that requires you to tense and release different muscles in their body and focus on their breathing. During session, lights are off and soft music is being played. Pts are given a stress ball to use if needed.   Goal Area(s) Addressed:  Patients will be able to describe progressive muscle relaxation.  Patient will practice using relaxation technique. Patient will identify a new coping skill.  Patient will follow multistep directions to reduce anxiety and stress.   Affect/Mood: N/A   Participation Level: Did not attend    Clinical Observations/Individualized Feedback: Patient did not attend.  Plan: Continue to engage patient in RT group sessions 2-3x/week.   Jeoffrey BRAVO Celestia, LRT, CTRS 05/15/2024 5:14 PM

## 2024-05-15 NOTE — Progress Notes (Signed)
" °   05/15/24 1500  Assessment Details  Time of Assessment Admission  Information Obtained From Patient  To be done on Admission  Risk Factors related to Demographics Caucasian;Gay, lesbian, or bisexual orientation;Living alone;Access to firearms  Current Mental Status NA  Loss Factors Loss of significant relationship (patient stated that my girlfriend and I broke up., she chose her ex.)  Historical Factors Family history of mental illness or substance abuse;Impulsivity  Risk Reduction Factors Employed  [place] Suicide Severity Rating Scale  1. In the past month -  Have you wished you were dead or wished you could go to sleep and not wake up? Yes  2. In the past month - Have you actually had any thoughts of killing yourself? Yes  3. In the past month - Have you been thinking about how you might kill yourself? Yes  4. In the past month - Have you had these thoughts and had some intention of acting on them? Yes  5. In the past month - Have you started to work out or worked out the details of how to kill yourself? Do you intend to carry out this plan? Yes  6. Have you ever done anything, started to do anything, or prepared to do anything to end your life? No  C-SSRS RISK CATEGORY High Risk  Patient location: Behavioral Health In-patient Unit  [facility] Suicide Precaution Interventions  [facility] Suicide Bundle Interventions High Risk Interventions implemented    "

## 2024-05-15 NOTE — Progress Notes (Signed)
" °   05/15/24 1845  Psych Admission Type (Psych Patients Only)  Admission Status Involuntary  Psychosocial Assessment  Patient Complaints Anger (patient is upset that she is here)  Eye Contact Brief  Facial Expression Angry  Affect Angry  Speech Aggressive  Interaction Defensive  Motor Activity Slow  Appearance/Hygiene Unremarkable  Behavior Characteristics Agressive verbally;Agitated  Mood Preoccupied;Angry;Irritable  Aggressive Behavior  Effect No apparent injury  Thought Process  Coherency WDL  Content Blaming others  Delusions None reported or observed  Perception WDL  Hallucination None reported or observed  Judgment Poor  Confusion None  Danger to Self  Current suicidal ideation? Denies  Danger to Others  Danger to Others None reported or observed    "

## 2024-05-15 NOTE — ED Provider Notes (Signed)
 I was notified that the patient was accepted to [facility].  She remains medically stable for transfer.   Emil Share, DO 05/15/24 1044

## 2024-05-15 NOTE — Group Note (Signed)
 Date:  05/15/2024 Time:  8:46 PM  Group Topic/Focus:  Personal Choices and Values:   The focus of this group is to help patients assess and explore the importance of values in their lives, how their values affect their decisions, how they express their values and what opposes their expression.    Pt did not attend group.  Mikhi Athey L 05/15/2024, 8:46 PM

## 2024-05-15 NOTE — ED Notes (Signed)
 NOTE  DONE AT 0743 DISREGARD  PAPERWORK HAS ALREADY BEEN COMPLETED

## 2024-05-15 NOTE — ED Notes (Signed)
 [facility] called, will call back with ETA

## 2024-05-15 NOTE — ED Notes (Signed)
 This RN unable to locate belongings inventory sheet, belongings bag from locker #3 and knife locked with security to go with emergency planning/management officer to [facility].

## 2024-05-15 NOTE — Plan of Care (Signed)
" °  Problem: Education: Goal: Knowledge of [facility] Education information/materials will improve Outcome: Not Progressing Goal: Emotional status will improve Outcome: Not Progressing Goal: Mental status will improve Outcome: Not Progressing Goal: Verbalization of understanding the information provided will improve Outcome: Not Progressing   Problem: Activity: Goal: Interest or engagement in activities will improve Outcome: Not Progressing Goal: Sleeping patterns will improve Outcome: Not Progressing   Problem: Coping: Goal: Ability to verbalize frustrations and anger appropriately will improve Outcome: Not Progressing Goal: Ability to demonstrate self-control will improve Outcome: Not Progressing   Problem: Health Behavior/Discharge Planning: Goal: Identification of resources available to assist in meeting health care needs will improve Outcome: Not Progressing Goal: Compliance with treatment plan for underlying cause of condition will improve Outcome: Not Progressing   Problem: Physical Regulation: Goal: Ability to maintain clinical measurements within normal limits will improve Outcome: Not Progressing   Problem: Safety: Goal: Periods of time without injury will increase Outcome: Not Progressing   Problem: Education: Goal: Ability to make informed decisions regarding treatment will improve Outcome: Not Progressing   Problem: Medication: Goal: Compliance with prescribed medication regimen will improve Outcome: Not Progressing   Problem: Self-Concept: Goal: Ability to disclose and discuss suicidal ideas will improve Outcome: Not Progressing Goal: Will verbalize positive feelings about self Outcome: Not Progressing Note: Patient is initiating therapy. Patient will maintain adherence and work on increased adherence    Problem: Education: Goal: Knowledge of [place] [facility] Education information/materials will improve Outcome: Not Progressing Goal:  Emotional status will improve Outcome: Not Progressing Goal: Mental status will improve Outcome: Not Progressing Goal: Verbalization of understanding the information provided will improve Outcome: Not Progressing   Problem: Safety: Goal: Periods of time without injury will increase Outcome: Not Progressing   Problem: Health Behavior/Discharge Planning: Goal: Ability to identify changes in lifestyle to reduce recurrence of condition will improve Outcome: Not Progressing Goal: Identification of resources available to assist in meeting health care needs will improve Outcome: Not Progressing   Problem: Physical Regulation: Goal: Complications related to the disease process, condition or treatment will be avoided or minimized Outcome: Not Progressing   Problem: Safety: Goal: Ability to remain free from injury will improve Outcome: Not Progressing   "

## 2024-05-15 NOTE — Tx Team (Signed)
 Initial Treatment Plan 05/15/2024 6:54 PM [name] FMW:[id number]    PATIENT STRESSORS: Loss of significant relationship     PATIENT STRENGTHS: General fund of knowledge  Supportive family/friends  Work skills    PATIENT IDENTIFIED PROBLEMS: Alcohol  abuse  SI with intent and having gun in possession                   DISCHARGE CRITERIA:  Ability to meet basic life and health needs Improved stabilization in mood, thinking, and/or behavior Need for constant or close observation no longer present Reduction of life-threatening or endangering symptoms to within safe limits  PRELIMINARY DISCHARGE PLAN: Outpatient therapy Return to previous living arrangement Return to previous work or school arrangements  PATIENT/FAMILY INVOLVEMENT: This treatment plan has been presented to and reviewed with the patient, Jordan Foster. The patient and family has been given the opportunity to ask questions and make suggestions.  Germaine Ripp, RN 05/15/2024, 6:54 PM

## 2024-05-15 NOTE — ED Notes (Signed)
 All information (EMTALA, carelink, IVC 3 copies, etc.) included in envelope; sheriff is approx 10 min away. Nurse notified and discussed paperwork

## 2024-05-15 NOTE — Group Note (Signed)
 [facility] LCSW Group Therapy Note   Group Date: 05/15/2024 Start Time: 1300 End Time: 1400   Type of Therapy/Topic:  Group Therapy:  Emotion Regulation  Participation Level:  Did Not Attend   Mood:  Description of Group:    The purpose of this group is to assist patients in learning to regulate negative emotions and experience positive emotions. Patients will be guided to discuss ways in which they have been vulnerable to their negative emotions. These vulnerabilities will be juxtaposed with experiences of positive emotions or situations, and patients challenged to use positive emotions to combat negative ones. Special emphasis will be placed on coping with negative emotions in conflict situations, and patients will process healthy conflict resolution skills.  Therapeutic Goals: Patient will identify two positive emotions or experiences to reflect on in order to balance out negative emotions:  Patient will label two or more emotions that they find the most difficult to experience:  Patient will be able to demonstrate positive conflict resolution skills through discussion or role plays:   Summary of Patient Progress:   Patient did not attend.     Therapeutic Modalities:   Cognitive Behavioral Therapy Feelings Identification Dialectical Behavioral Therapy   Alveta CHRISTELLA Kerns, LCSW

## 2024-05-16 DIAGNOSIS — F332 Major depressive disorder, recurrent severe without psychotic features: Principal | ICD-10-CM

## 2024-05-16 NOTE — BH IP Treatment Plan (Signed)
 Interdisciplinary Treatment and Diagnostic Plan Update  05/16/2024 Time of Session: 10:26 AM Jordan Foster MRN: [id number]  Principal Diagnosis: MDD (major depressive disorder), recurrent severe, without psychosis (HCC)  Secondary Diagnoses: Principal Problem:   MDD (major depressive disorder), recurrent severe, without psychosis (HCC)   Current Medications:  Current Facility-Administered Medications  Medication Dose Route Frequency Provider Last Rate Last Admin   acetaminophen  (TYLENOL ) tablet 650 mg  650 mg Oral Q6H PRN Mannie Jerel PARAS, NP       albuterol  (VENTOLIN  HFA) 108 (90 Base) MCG/ACT inhaler 2 puff  2 puff Inhalation Q4H PRN Mannie Jerel PARAS, NP       ALPRAZolam  (XANAX ) tablet 0.5 mg  0.5 mg Oral TID Mannie Jerel PARAS, NP   0.5 mg at 05/16/24 1128   alum & mag hydroxide-simeth (MAALOX/MYLANTA) 200-200-20 MG/5ML suspension 30 mL  30 mL Oral Q4H PRN Mannie Jerel PARAS, NP       ARIPiprazole  (ABILIFY ) tablet 5 mg  5 mg Oral QHS Mannie Jerel PARAS, NP   5 mg at 05/15/24 2133   haloperidol  (HALDOL ) tablet 5 mg  5 mg Oral TID PRN Mannie Jerel PARAS, NP       And   diphenhydrAMINE  (BENADRYL ) capsule 50 mg  50 mg Oral TID PRN Mannie Jerel PARAS, NP       haloperidol  lactate (HALDOL ) injection 10 mg  10 mg Intramuscular TID PRN Mannie Jerel PARAS, NP       And   diphenhydrAMINE  (BENADRYL ) injection 50 mg  50 mg Intramuscular TID PRN Mannie Jerel PARAS, NP       And   LORazepam  (ATIVAN ) injection 2 mg  2 mg Intramuscular TID PRN Mannie Jerel PARAS, NP       haloperidol  lactate (HALDOL ) injection 5 mg  5 mg Intramuscular TID PRN Mannie Jerel PARAS, NP       And   diphenhydrAMINE  (BENADRYL ) injection 50 mg  50 mg Intramuscular TID PRN Mannie Jerel PARAS, NP       And   LORazepam  (ATIVAN ) injection 2 mg  2 mg Intramuscular TID PRN Mannie Jerel PARAS, NP       fluticasone  furoate-vilanterol (BREO ELLIPTA ) 200-25 MCG/ACT 1 puff  1 puff Inhalation Daily Mannie Jerel PARAS, NP       gabapentin  (NEURONTIN )  capsule 800 mg  800 mg Oral 2 times per day Mannie Jerel PARAS, NP   800 mg at 05/16/24 0900   And   gabapentin  (NEURONTIN ) capsule 1,200 mg  1,200 mg Oral QHS Mannie Jerel PARAS, NP   1,200 mg at 05/15/24 2133   loratadine  (CLARITIN ) tablet 10 mg  10 mg Oral Daily Mannie Jerel PARAS, NP   10 mg at 05/16/24 9163   losartan  (COZAAR ) tablet 100 mg  100 mg Oral Daily Mannie Jerel PARAS, NP   100 mg at 05/16/24 0900   magnesium  hydroxide (MILK OF MAGNESIA) suspension 30 mL  30 mL Oral Daily PRN Mannie Jerel PARAS, NP       nicotine  (NICODERM CQ  - dosed in mg/24 hours) patch 21 mg  21 mg Transdermal Q0600 Mannie Jerel PARAS, NP   21 mg at 05/16/24 9256   nicotine  polacrilex (NICORETTE ) gum 2 mg  2 mg Oral PRN Jadapalle, Sree, MD   2 mg at 05/16/24 1128   pantoprazole  (PROTONIX ) EC tablet 40 mg  40 mg Oral Daily Mannie Jerel PARAS, NP   40 mg at 05/16/24 0836   prazosin  (MINIPRESS ) capsule 5 mg  5 mg  Oral QHS PRN Mannie Jerel PARAS, NP   5 mg at 05/15/24 2134   propranolol  ER (INDERAL  LA) 24 hr capsule 60 mg  60 mg Oral Daily Mannie Jerel PARAS, NP   60 mg at 05/16/24 0900   venlafaxine  XR (EFFEXOR -XR) 24 hr capsule 150 mg  150 mg Oral Q breakfast Mannie Jerel PARAS, NP       PTA Medications: Medications Prior to Admission  Medication Sig Dispense Refill Last Dose/Taking   acetaminophen  (TYLENOL ) 500 MG tablet Take 1,000 mg by mouth every 6 (six) hours as needed for mild pain (pain score 1-3), moderate pain (pain score 4-6) or headache.      albuterol  (VENTOLIN  HFA) 108 (90 Base) MCG/ACT inhaler Inhale 2 puffs into the lungs every 4 (four) hours as needed.      ALPRAZolam  (XANAX ) 0.5 MG tablet Take 0.5 mg by mouth 3 (three) times daily.      ARIPiprazole  (ABILIFY ) 5 MG tablet Take 5 mg by mouth at bedtime.      budesonide -formoterol  (SYMBICORT ) 160-4.5 MCG/ACT inhaler Inhale 2 puffs into the lungs 2 (two) times daily.      cetirizine (ZYRTEC) 10 MG tablet Take 10 mg by mouth daily.      desvenlafaxine  (PRISTIQ ) 100 MG  24 hr tablet Take 100 mg by mouth daily.      gabapentin  (NEURONTIN ) 800 MG tablet Take 800-1,200 mg by mouth See admin instructions. Take 800 mg by mouth 2 times daily and take 1200 mg every evening      gemfibrozil (LOPID) 600 MG tablet Take 600 mg by mouth 2 (two) times daily before a meal. (Patient not taking: Reported on 05/14/2024)      hydrOXYzine  (ATARAX ) 50 MG tablet Take 50 mg by mouth every 6 (six) hours as needed.      losartan  (COZAAR ) 100 MG tablet Take 100 mg by mouth daily.      naltrexone (DEPADE) 50 MG tablet Take 50 mg by mouth daily.      pantoprazole  (PROTONIX ) 40 MG tablet Take 40 mg by mouth daily.      prazosin  (MINIPRESS ) 5 MG capsule Take 5 mg by mouth at bedtime as needed (nightmares).      promethazine  (PHENERGAN ) 25 MG tablet Take 25 mg by mouth every 6 (six) hours as needed for nausea or vomiting (N/V).      propranolol  ER (INDERAL  LA) 60 MG 24 hr capsule Take 1 capsule (60 mg total) by mouth daily. (Patient taking differently: Take 60 mg by mouth 2 (two) times daily.) 30 capsule 0    Vedolizumab 108 MG/0.68ML SOAJ Inject 300 mg into the skin every 14 (fourteen) days.       Patient Stressors: Loss of significant relationship    Patient Strengths: General fund of knowledge  Supportive family/friends  Work skills   Treatment Modalities: Medication Management, Group therapy, Case management,  1 to 1 session with clinician, Psychoeducation, Recreational therapy.   Physician Treatment Plan for Primary Diagnosis: MDD (major depressive disorder), recurrent severe, without psychosis (HCC) Long Term Goal(s):     Short Term Goals:    Medication Management: Evaluate patient's response, side effects, and tolerance of medication regimen.  Therapeutic Interventions: 1 to 1 sessions, Unit Group sessions and Medication administration.  Evaluation of Outcomes: Not Met  Physician Treatment Plan for Secondary Diagnosis: Principal Problem:   MDD (major depressive  disorder), recurrent severe, without psychosis (HCC)  Long Term Goal(s):     Short Term Goals:  Medication Management: Evaluate patient's response, side effects, and tolerance of medication regimen.  Therapeutic Interventions: 1 to 1 sessions, Unit Group sessions and Medication administration.  Evaluation of Outcomes: Not Met   RN Treatment Plan for Primary Diagnosis: MDD (major depressive disorder), recurrent severe, without psychosis (HCC) Long Term Goal(s): Knowledge of disease and therapeutic regimen to maintain health will improve  Short Term Goals: Ability to verbalize frustration and anger appropriately will improve, Ability to demonstrate self-control, Ability to participate in decision making will improve, Ability to verbalize feelings will improve, Ability to disclose and discuss suicidal ideas, and Ability to identify and develop effective coping behaviors will improve  Medication Management: RN will administer medications as ordered by provider, will assess and evaluate patient's response and provide education to patient for prescribed medication. RN will report any adverse and/or side effects to prescribing provider.  Therapeutic Interventions: 1 on 1 counseling sessions, Psychoeducation, Medication administration, Evaluate responses to treatment, Monitor vital signs and CBGs as ordered, Perform/monitor CIWA, COWS, AIMS and Fall Risk screenings as ordered, Perform wound care treatments as ordered.  Evaluation of Outcomes: Not Met   LCSW Treatment Plan for Primary Diagnosis: MDD (major depressive disorder), recurrent severe, without psychosis (HCC) Long Term Goal(s): Safe transition to appropriate next level of care at discharge, Engage patient in therapeutic group addressing interpersonal concerns.  Short Term Goals: Engage patient in aftercare planning with referrals and resources, Increase social support, Increase ability to appropriately verbalize feelings, Increase  emotional regulation, Facilitate acceptance of mental health diagnosis and concerns, Facilitate patient progression through stages of change regarding substance use diagnoses and concerns, Identify triggers associated with mental health/substance abuse issues, and Increase skills for wellness and recovery  Therapeutic Interventions: Assess for all discharge needs, 1 to 1 time with Social worker, Explore available resources and support systems, Assess for adequacy in community support network, Educate family and significant other(s) on suicide prevention, Complete Psychosocial Assessment, Interpersonal group therapy.  Evaluation of Outcomes: Not Met   Progress in Treatment: Attending groups: Yes. Participating in groups: Yes. Taking medication as prescribed: Yes. Toleration medication: Yes. Family/Significant other contact made: No, will contact:  Warren SQUIBB, [phone], Friend Patient understands diagnosis: Yes. and No. Discussing patient identified problems/goals with staff: Yes. Medical problems stabilized or resolved: Yes. Denies suicidal/homicidal ideation: Yes. Issues/concerns per patient self-inventory: No. Other: None  New problem(s) identified: No, Describe:  None  New Short Term/Long Term Goal(s):detox, elimination of symptoms of psychosis, medication management for mood stabilization; elimination of SI thoughts; development of comprehensive mental wellness/sobriety plan.    Patient Goals: To go home.   Discharge Plan or Barriers: CSW to assist with the development of appropriate discharge plan.    Reason for Continuation of Hospitalization: Aggression Anxiety Delusions  Depression Suicidal ideation Withdrawal symptoms  Estimated Length of Stay: 1-7 days  Last 3 [place] Suicide Severity Risk Score: Flowsheet Row Admission (Current) from 05/15/2024 in [facility] INPATIENT BEH[facility]ICINE ED from 05/14/2024 in [facility] Emergency Department at [facility] ED from  11/26/2023 in [facility] Emergency Department at [facility]  C-SSRS RISK CATEGORY High Risk High Risk No Risk    Last [facility] 2/9 Scores:    09/03/2021    8:20 AM 09/22/2016   10:30 AM  Depression screen PHQ 2/9  Decreased Interest 3 1  Down, Depressed, Hopeless 3 0  PHQ - 2 Score 6 1  Altered sleeping 3 1  Tired, decreased energy 2 0  Change in appetite 1 1  Feeling  bad or failure about yourself  3 0  Trouble concentrating 2 1  Moving slowly or fidgety/restless 0 0  Suicidal thoughts 2 0   PHQ-9 Score 19  4   Difficult doing work/chores Somewhat difficult Not difficult at all     Data saved with a previous flowsheet row definition    Scribe for Treatment Team: Mima Cranmore M Calvina Liptak, LCSW 05/16/2024 11:50 AM

## 2024-05-16 NOTE — Plan of Care (Signed)
   Problem: Education: Goal: Emotional status will improve Outcome: Progressing Goal: Mental status will improve Outcome: Progressing Goal: Verbalization of understanding the information provided will improve Outcome: Progressing   Problem: Activity: Goal: Interest or engagement in activities will improve Outcome: Progressing Goal: Sleeping patterns will improve Outcome: Progressing
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
## 2024-05-16 NOTE — Plan of Care (Signed)
" °  Problem: Education: Goal: Knowledge of [facility] Education information/materials will improve Outcome: Not Progressing Goal: Emotional status will improve Outcome: Not Progressing Goal: Mental status will improve Outcome: Not Progressing Goal: Verbalization of understanding the information provided will improve Outcome: Not Progressing   Problem: Activity: Goal: Interest or engagement in activities will improve Outcome: Not Progressing Goal: Sleeping patterns will improve Outcome: Not Progressing   Problem: Coping: Goal: Ability to verbalize frustrations and anger appropriately will improve Outcome: Not Progressing Goal: Ability to demonstrate self-control will improve Outcome: Not Progressing   Problem: Health Behavior/Discharge Planning: Goal: Identification of resources available to assist in meeting health care needs will improve Outcome: Not Progressing Goal: Compliance with treatment plan for underlying cause of condition will improve Outcome: Not Progressing   Problem: Physical Regulation: Goal: Ability to maintain clinical measurements within normal limits will improve Outcome: Not Progressing   Problem: Safety: Goal: Periods of time without injury will increase Outcome: Not Progressing   Problem: Education: Goal: Ability to make informed decisions regarding treatment will improve Outcome: Not Progressing   Problem: Medication: Goal: Compliance with prescribed medication regimen will improve Outcome: Not Progressing   Problem: Self-Concept: Goal: Ability to disclose and discuss suicidal ideas will improve Outcome: Not Progressing Goal: Will verbalize positive feelings about self Outcome: Not Progressing Note: Patient is initiating therapy. Patient will work on increased adherence and be monitored by provider to determine if a change in treatment plan is warranted    Problem: Education: Goal: Knowledge of [facility] Education  information/materials will improve Outcome: Not Progressing Goal: Emotional status will improve Outcome: Not Progressing Goal: Mental status will improve Outcome: Not Progressing Goal: Verbalization of understanding the information provided will improve Outcome: Not Progressing   Problem: Safety: Goal: Periods of time without injury will increase Outcome: Not Progressing   Problem: Health Behavior/Discharge Planning: Goal: Ability to identify changes in lifestyle to reduce recurrence of condition will improve Outcome: Not Progressing Goal: Identification of resources available to assist in meeting health care needs will improve Outcome: Not Progressing   Problem: Physical Regulation: Goal: Complications related to the disease process, condition or treatment will be avoided or minimized Outcome: Not Progressing   Problem: Safety: Goal: Ability to remain free from injury will improve Outcome: Not Progressing   "
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
## 2024-05-16 NOTE — Progress Notes (Signed)
" °   05/15/24 2134  Psych Admission Type (Psych Patients Only)  Admission Status Involuntary  Psychosocial Assessment  Patient Complaints None  Eye Contact Fair  Facial Expression Flat  Affect Flat  Speech Logical/coherent  Interaction Assertive  Motor Activity Slow  Appearance/Hygiene Unremarkable  Behavior Characteristics Appropriate to situation  Mood Pleasant  Thought Process  Coherency WDL  Content WDL  Delusions None reported or observed  Perception WDL  Hallucination None reported or observed  Judgment Impaired  Confusion None  Danger to Self  Current suicidal ideation? Denies  Danger to Others  Danger to Others None reported or observed    "

## 2024-05-16 NOTE — Group Note (Signed)
 Date:  05/16/2024 Time:  11:13 AM  Group Topic/Focus:  Building Self Esteem:   The Focus of this group is helping patients become aware of the effects of self-esteem on their lives, the things they and others do that enhance or undermine their self-esteem, seeing the relationship between their level of self-esteem and the choices they make and learning ways to enhance self-esteem.    Participation Level:  Did Not Attend   Clayborne DELENA June 05/16/2024, 11:13 AM

## 2024-05-16 NOTE — Progress Notes (Signed)
" °   05/16/24 1700  Psych Admission Type (Psych Patients Only)  Admission Status Involuntary  Psychosocial Assessment  Patient Complaints Agitation;Anger (patient still upset that she is here.)  Eye Contact Brief  Facial Expression Flat  Affect Angry  Speech Aggressive  Interaction Defensive;Hostile;Poor  Motor Activity Slow  Appearance/Hygiene Unremarkable  Behavior Characteristics Agressive verbally;Unwilling to participate (patient stated that she will not be attending any groups while here and has not done so.)  Mood Labile;Irritable;Preoccupied  Aggressive Behavior  Effect No apparent injury  Thought Process  Coherency WDL  Content Preoccupation (patient continues to focus on being discharged)  Delusions None reported or observed  Perception WDL  Hallucination None reported or observed  Judgment Poor  Confusion None  Danger to Self  Current suicidal ideation? Denies  Danger to Others  Danger to Others None reported or observed    "

## 2024-05-16 NOTE — Progress Notes (Signed)
 Patient refused scheduled Breo inhaler and Effexor , stating I don't take that. MD will be notified.

## 2024-05-16 NOTE — Group Note (Signed)
 Recreation Therapy Group Note   Group Topic:Leisure Education  Group Date: 05/16/2024 Start Time: 1035 End Time: 1130 Facilitators: Celestia Jeoffrey FORBES ARTICE, CTRS Location: Craft Room  Group Description: Leisure. Patients were given the option to choose from journaling, coloring, drawing, making origami, playing with playdoh, listening to music or singing karaoke. LRT and pts discussed the meaning of leisure, the importance of participating in leisure during their free time/when they're outside of the hospital, as well as how our leisure interests can also serve as coping skills.   Goal Area(s) Addressed:  Patient will identify a current leisure interest.  Patient will learn the definition of leisure. Patient will practice making a positive decision. Patient will have the opportunity to try a new leisure activity. Patient will communicate with peers and LRT.    Affect/Mood: N/A   Participation Level: Did not attend    Clinical Observations/Individualized Feedback: Patient did not attend.   Plan: Continue to engage patient in RT group sessions 2-3x/week.   Jeoffrey FORBES Celestia, LRT, CTRS 05/16/2024 12:48 PM

## 2024-05-16 NOTE — BHH Suicide Risk Assessment (Signed)
 [facility] Admission Suicide Risk Assessment   Nursing information obtained from:  Patient Demographic factors:  Caucasian, Gay, lesbian, or bisexual orientation, Living alone, Access to firearms Current Mental Status:  NA Loss Factors:  Loss of significant relationship (patient stated that my girlfriend and I broke up., she chose her ex.) Historical Factors:  Family history of mental illness or substance abuse, Impulsivity Risk Reduction Factors:  Employed  Total Time spent with patient: 30 minutes Principal Problem: MDD (major depressive disorder), recurrent severe, without psychosis (HCC) Diagnosis:  Principal Problem:   MDD (major depressive disorder), recurrent severe, without psychosis (HCC)  Subjective Data: Jordan Foster is a 44 y.o. female with MDD, well, hypertension, asthma, cocaine abuse, alcohol  abuse presenting for suicidal ideation. Brought in by [facility] and patient did have a loaded gun and reported that she was planned to use it but she states I am too much of a coward to pull the trigger. She states she has been a few months sober but did start drinking today and also used cocaine in the last hour .Patient is admitted to  psych unit with Q15 min safety monitoring. Multidisciplinary team approach is offered. Medication management; group/milieu therapy is offered.   Continued Clinical Symptoms:  Alcohol  Use Disorder Identification Test Final Score (AUDIT): 1 The Alcohol  Use Disorders Identification Test, Guidelines for Use in Primary Care, Second Edition.  World Science Writer [facility]). Score between 0-7:  no or low risk or alcohol  related problems. Score between 8-15:  moderate risk of alcohol  related problems. Score between 16-19:  high risk of alcohol  related problems. Score 20 or above:  warrants further diagnostic evaluation for alcohol  dependence and treatment.   CLINICAL FACTORS:   Depression:   Comorbid alcohol  abuse/dependence   Musculoskeletal: Strength &  Muscle Tone: within normal limits Gait & Station: normal Patient leans: N/A  Psychiatric Specialty Exam:  Presentation  General Appearance:  Casual  Eye Contact: Minimal  Speech: Normal Rate  Speech Volume: Increased  Handedness:No data recorded  Mood and Affect  Mood: Irritable; Labile  Affect: Labile   Thought Process  Thought Processes: Coherent  Descriptions of Associations:Intact  Orientation:Full (Time, Place and Person)  Thought Content:Illogical  History of Schizophrenia/Schizoaffective disorder:No  Duration of Psychotic Symptoms:No data recorded Hallucinations:Hallucinations: None  Ideas of Reference:None  Suicidal Thoughts:Suicidal Thoughts: No  Homicidal Thoughts:Homicidal Thoughts: No   Sensorium  Memory: Immediate Fair; Recent Fair  Judgment: Impaired  Insight: Shallow   Executive Functions  Concentration: Fair  Attention Span: Fair  Recall: [employer] of Knowledge: Fair  Language: Fair   Psychomotor Activity  Psychomotor Activity: Psychomotor Activity: Normal   Assets  Assets: Communication Skills; Desire for Improvement; Resilience   Sleep  Sleep: Sleep: Fair    Physical Exam: Physical Exam ROS Blood pressure 115/74, pulse 92, temperature 98.6 F (37 C), temperature source Oral, resp. rate 18, height 5' 7 (1.702 m), weight 75.8 kg, SpO2 99%. Body mass index is 26.16 kg/m.   COGNITIVE FEATURES THAT CONTRIBUTE TO RISK:  None    SUICIDE RISK:   Minimal: No identifiable suicidal ideation.  Patients presenting with no risk factors but with morbid ruminations; may be classified as minimal risk based on the severity of the depressive symptoms  PLAN OF CARE: Patient is admitted to  psych unit with Q15 min safety monitoring. Multidisciplinary team approach is offered. Medication management; group/milieu therapy is offered.   I certify that inpatient services furnished can reasonably be expected to  improve the patient's condition.  Allyn Foil, MD 05/16/2024, 4:25 PM

## 2024-05-16 NOTE — Group Note (Signed)
 Recreation Therapy Group Note   Group Topic:Health and Wellness  Group Date: 05/16/2024 Start Time: 1530 End Time: 1610 Facilitators: Celestia Jeoffrey BRAVO, LRT, CTRS Location: Courtyard  Group Description: [employer]. LRT and patients played games of basketball, drew with chalk, and played corn hole while outside in the courtyard while getting fresh air and sunlight. Music was being played in the background. LRT and peers conversed about different games they have played before, what they do in their free time and anything else that is on their minds. LRT encouraged pts to drink water after being outside, sweating and getting their heart rate up.  Goal Area(s) Addressed: Patient will build on frustration tolerance skills. Patients will partake in a competitive play game with peers. Patients will gain knowledge of new leisure interest/hobby.    Affect/Mood: N/A   Participation Level: Did not attend    Clinical Observations/Individualized Feedback: Patient did not attend.  Plan: Continue to engage patient in RT group sessions 2-3x/week.   Jeoffrey BRAVO Celestia, LRT, CTRS 05/16/2024 5:03 PM

## 2024-05-17 MED ORDER — PRAZOSIN HCL 2 MG PO CAPS
5.0000 mg | ORAL_CAPSULE | Freq: Every day | ORAL | Status: DC
  Administered 2024-05-17 – 2024-05-19 (×3): 5 mg via ORAL
  Filled 2024-05-17 (×3): qty 1

## 2024-05-17 MED ORDER — HYDROXYZINE HCL 25 MG PO TABS
25.0000 mg | ORAL_TABLET | Freq: Three times a day (TID) | ORAL | Status: DC | PRN
  Administered 2024-05-17 – 2024-05-20 (×8): 25 mg via ORAL
  Filled 2024-05-17 (×9): qty 1

## 2024-05-17 NOTE — Plan of Care (Addendum)
 Pt A & O X4. Denies SI, HI, AVH and pain when assessed. Pt's presents irritable, blunted affect with pressured speech, verbally abusive on initial interactions in relation to current treatment regimen. Reports she slept well last night with good appetite. Pt remains medication compliant, attended scheduled groups and is cooperative with care at this time. Mood remains labile at intervals. Pt is also preoccupied about d/c despite multiple verbal education about d/c procedure. Safety checks maintained at Q 15 minutes intervals without outburst. Tolerates meals, fluids and medications well.  Emotional support, encouragement and reassurance offered.   Problem: Activity: Goal: Interest or engagement in activities will improve Outcome: Progressing   Problem: Coping: Goal: Ability to verbalize frustrations and anger appropriately will improve Outcome: Progressing

## 2024-05-17 NOTE — Group Note (Signed)
 Date:  05/17/2024 Time:  3:29 AM   Additional Comments:  Did not attend group.  Jordan Foster 05/17/2024, 3:29 AM

## 2024-05-17 NOTE — Group Note (Signed)
 Date:  05/17/2024 Time:  5:10 PM  Group Topic/Focus:  Goals Group:   The focus of this group is to help patients establish daily goals to achieve during treatment and discuss how the patient can incorporate goal setting into their daily lives to aide in recovery.    Participation Level:  Active  Participation Quality:  Appropriate  Affect:  Appropriate  Cognitive:  Appropriate  Insight: Appropriate  Engagement in Group:  Engaged  Modes of Intervention:  Activity  Additional Comments:    Jordan Foster June 05/17/2024, 5:10 PM

## 2024-05-17 NOTE — Plan of Care (Signed)
" °  Problem: Education: Goal: Knowledge of [facility] Education information/materials will improve Outcome: Progressing Goal: Emotional status will improve Outcome: Progressing Goal: Mental status will improve Outcome: Progressing Goal: Verbalization of understanding the information provided will improve Outcome: Progressing   Problem: Activity: Goal: Interest or engagement in activities will improve Outcome: Progressing Goal: Sleeping patterns will improve Outcome: Progressing   Problem: Coping: Goal: Ability to verbalize frustrations and anger appropriately will improve Outcome: Progressing Goal: Ability to demonstrate self-control will improve Outcome: Progressing   Problem: Health Behavior/Discharge Planning: Goal: Identification of resources available to assist in meeting health care needs will improve Outcome: Progressing Goal: Compliance with treatment plan for underlying cause of condition will improve Outcome: Progressing   Problem: Physical Regulation: Goal: Ability to maintain clinical measurements within normal limits will improve Outcome: Progressing   Problem: Safety: Goal: Periods of time without injury will increase Outcome: Progressing   Problem: Education: Goal: Ability to make informed decisions regarding treatment will improve Outcome: Progressing   Problem: Medication: Goal: Compliance with prescribed medication regimen will improve Outcome: Progressing   Problem: Self-Concept: Goal: Ability to disclose and discuss suicidal ideas will improve Outcome: Progressing Goal: Will verbalize positive feelings about self Outcome: Progressing   Problem: Education: Goal: Knowledge of [facility] Education information/materials will improve Outcome: Progressing Goal: Emotional status will improve Outcome: Progressing Goal: Mental status will improve Outcome: Progressing Goal: Verbalization of understanding the information provided will  improve Outcome: Progressing   Problem: Safety: Goal: Periods of time without injury will increase Outcome: Progressing   Problem: Health Behavior/Discharge Planning: Goal: Ability to identify changes in lifestyle to reduce recurrence of condition will improve Outcome: Progressing Goal: Identification of resources available to assist in meeting health care needs will improve Outcome: Progressing   Problem: Physical Regulation: Goal: Complications related to the disease process, condition or treatment will be avoided or minimized Outcome: Progressing   Problem: Safety: Goal: Ability to remain free from injury will improve Outcome: Progressing   "

## 2024-05-17 NOTE — Group Note (Signed)
 Date:  05/17/2024 Time:  8:51 PM  Group Topic/Focus:  Managing Feelings:   The focus of this group is to identify what feelings patients have difficulty handling and develop a plan to handle them in a healthier way upon discharge.    Pt did not attend group.  Zyeir Dymek L 05/17/2024, 8:51 PM

## 2024-05-17 NOTE — Progress Notes (Cosign Needed Addendum)
 Patient ID: Jordan Foster, female   DOB: 08-24-1980, 44 y.o.   MRN: [id number] [facility] MD Progress Note  05/17/2024 10:00 AM Jordan Foster  MRN:  [id number]  Jordan Foster is a 44 y.o. female with MDD, well, hypertension, asthma, cocaine abuse, alcohol  abuse presenting for suicidal ideation. Brought in by [facility] and patient did have a loaded gun and reported that she was planned to use it but she states I am too much of a coward to pull the trigger. She states she has been a few months sober but did start drinking today and also used cocaine in the last hour .Patient is admitted to psych unit with Q15 min safety monitoring. Multidisciplinary team approach is offered. Medication management; group/milieu therapy is offered.    Subjective:  Chart reviewed, case discussed in multidisciplinary meeting, patient seen during rounds.   05/17/2024: Patient was seen this in bed morning for psychiatric reassessment during clinical rounds. Patient is alert and oriented X 4, calm, cooperative, and engages well in the interview. Patient reports that she wanted to apologize for her behavior towards the psychiatrist yesterday due to being upset and wanting to be discharged. Patient states she is worried about losing her employment and experiencing financial challenges. Patient reports she realizes she was stupid for what I did and she will not drink alcohol  again. However, patient endorses her anxiety and depression is manageable, and rates them both a 0 out of 10. Patient is observed minimizing her symptoms, lacking insight and judgment. She reports she has a normal appetite and sleep pattern is adequate. Patient denies suicidal or homicidal ideations or perceptual disturbances today. Will continue to monitor.  Past Psychiatric History: see h&P Family History:  Family History  Problem Relation Age of Onset   Hypertension Mother    Social History:  Social History   Substance and Sexual Activity   Alcohol  Use Yes   Alcohol /week: 35.0 standard drinks of alcohol    Types: 35 Shots of liquor per week   Comment: 10 - 12 shots/daily     Social History   Substance and Sexual Activity  Drug Use Not Currently   Types: Other-see comments   Comment: THC drink daily for sleep    Social History   Socioeconomic History   Marital status: Single    Spouse name: Not on file   Number of children: 0   Years of education: 16   Highest education level: Not on file  Occupational History   Occupation: [employer]   Tobacco Use   Smoking status: Never   Smokeless tobacco: Never  Vaping Use   Vaping status: Every Day   Substances: Nicotine   Substance and Sexual Activity   Alcohol  use: Yes    Alcohol /week: 35.0 standard drinks of alcohol     Types: 35 Shots of liquor per week    Comment: 10 - 12 shots/daily   Drug use: Not Currently    Types: Other-see comments    Comment: THC drink daily for sleep   Sexual activity: Yes    Comment: W/ Female Partner  Other Topics Concern   Not on file  Social History Narrative   Fun/Hobby: Working to determine   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[employer]   Tobacco Use: Low Risk (05/15/2024)   Patient History    Smoking Tobacco Use: Never    Smokeless Tobacco Use: Never    Passive Exposure: Not on file  Financial Resource Strain: Low Risk (05/02/2023)  Received from [facility]   Overall Financial Resource Strain (CARDIA)    Difficulty of Paying Living Expenses: Not hard at all  Food Insecurity: No Food Insecurity (05/15/2024)   Epic    Worried About Programme Researcher, Broadcasting/film/video in the Last Year: Never true    Ran Out of Food in the Last Year: Never true  Transportation Needs: No Transportation Needs (05/15/2024)   Epic    Lack of Transportation (Medical): No    Lack of Transportation (Non-Medical): No  Physical Activity: Not on file  Stress: Not on file  Social Connections: Not on file  Depression (PHQ2-9): High Risk (09/03/2021)    Depression (PHQ2-9)    PHQ-2 Score: 19  Alcohol  Screen: Low Risk (05/15/2024)   Alcohol  Screen    Last Alcohol  Screening Score (AUDIT): 1  Housing: Low Risk (05/15/2024)   Epic    Unable to Pay for Housing in the Last Year: No    Number of Times Moved in the Last Year: 0    Homeless in the Last Year: No  Utilities: Not At Risk (05/15/2024)   Epic    Threatened with loss of utilities: No  Health Literacy: Not on file   Past Medical History:  Past Medical History:  Diagnosis Date   Asthma    Depression    Hypertension    Positive TB test     Past Surgical History:  Procedure Laterality Date   LASIK     NASAL SINUS SURGERY      Current Medications: Current Facility-Administered Medications  Medication Dose Route Frequency Provider Last Rate Last Admin   acetaminophen  (TYLENOL ) tablet 650 mg  650 mg Oral Q6H PRN Mannie Jerel PARAS, NP       albuterol  (VENTOLIN  HFA) 108 (90 Base) MCG/ACT inhaler 2 puff  2 puff Inhalation Q4H PRN Mannie Jerel PARAS, NP       ALPRAZolam  (XANAX ) tablet 0.5 mg  0.5 mg Oral TID Mannie Jerel PARAS, NP   0.5 mg at 05/17/24 0819   alum & mag hydroxide-simeth (MAALOX/MYLANTA) 200-200-20 MG/5ML suspension 30 mL  30 mL Oral Q4H PRN Mannie Jerel PARAS, NP       ARIPiprazole  (ABILIFY ) tablet 5 mg  5 mg Oral QHS Mannie Jerel PARAS, NP   5 mg at 05/16/24 2142   haloperidol  (HALDOL ) tablet 5 mg  5 mg Oral TID PRN Mannie Jerel PARAS, NP       And   diphenhydrAMINE  (BENADRYL ) capsule 50 mg  50 mg Oral TID PRN Mannie Jerel PARAS, NP       haloperidol  lactate (HALDOL ) injection 10 mg  10 mg Intramuscular TID PRN Mannie Jerel PARAS, NP       And   diphenhydrAMINE  (BENADRYL ) injection 50 mg  50 mg Intramuscular TID PRN Mannie Jerel PARAS, NP       And   LORazepam  (ATIVAN ) injection 2 mg  2 mg Intramuscular TID PRN Mannie Jerel PARAS, NP       haloperidol  lactate (HALDOL ) injection 5 mg  5 mg Intramuscular TID PRN Mannie Jerel PARAS, NP       And   diphenhydrAMINE  (BENADRYL ) injection 50  mg  50 mg Intramuscular TID PRN Mannie Jerel PARAS, NP       And   LORazepam  (ATIVAN ) injection 2 mg  2 mg Intramuscular TID PRN Mannie Jerel PARAS, NP       fluticasone  furoate-vilanterol (BREO ELLIPTA ) 200-25 MCG/ACT 1 puff  1 puff Inhalation Daily Mannie Jerel PARAS, NP  1 puff at 05/17/24 9178   gabapentin  (NEURONTIN ) capsule 800 mg  800 mg Oral 2 times per day Mannie Jerel PARAS, NP   800 mg at 05/17/24 9174   And   gabapentin  (NEURONTIN ) capsule 1,200 mg  1,200 mg Oral QHS Mannie Jerel PARAS, NP   1,200 mg at 05/16/24 2142   loratadine  (CLARITIN ) tablet 10 mg  10 mg Oral Daily Mannie Jerel PARAS, NP   10 mg at 05/17/24 9180   losartan  (COZAAR ) tablet 100 mg  100 mg Oral Daily Mannie Jerel PARAS, NP   100 mg at 05/17/24 0825   magnesium  hydroxide (MILK OF MAGNESIA) suspension 30 mL  30 mL Oral Daily PRN Mannie Jerel PARAS, NP       nicotine  (NICODERM CQ  - dosed in mg/24 hours) patch 21 mg  21 mg Transdermal Q0600 Mannie Jerel PARAS, NP   21 mg at 05/17/24 9178   nicotine  polacrilex (NICORETTE ) gum 2 mg  2 mg Oral PRN Jadapalle, Sree, MD   2 mg at 05/17/24 9178   pantoprazole  (PROTONIX ) EC tablet 40 mg  40 mg Oral Daily Mannie Jerel PARAS, NP   40 mg at 05/17/24 9180   prazosin  (MINIPRESS ) capsule 5 mg  5 mg Oral QHS PRN Mannie Jerel PARAS, NP   5 mg at 05/16/24 2145   propranolol  ER (INDERAL  LA) 24 hr capsule 60 mg  60 mg Oral Daily Mannie Jerel PARAS, NP   60 mg at 05/17/24 0825   venlafaxine  XR (EFFEXOR -XR) 24 hr capsule 150 mg  150 mg Oral Q breakfast Mannie Jerel PARAS, NP   150 mg at 05/17/24 9180    Lab Results: No results found for this or any previous visit (from the past 48 hours).  Blood Alcohol  level:  Lab Results  Component Value Date   ETH 185 (H) 05/14/2024   ETH 330 (HH) 06/20/2023    Metabolic Disorder Labs: Lab Results  Component Value Date   HGBA1C 5.2 09/03/2021   MPG 102.54 09/03/2021   No results found for: PROLACTIN Lab Results  Component Value Date   CHOL 230 (H) 09/03/2021    TRIG 77 09/03/2021   HDL 130 09/03/2021   CHOLHDL 1.8 09/03/2021   VLDL 15 09/03/2021   LDLCALC 85 09/03/2021    Physical Findings: AIMS:  , ,  ,  ,    CIWA:    COWS:      Psychiatric Specialty Exam:  Presentation  General Appearance:  Casual  Eye Contact: Minimal  Speech: Normal Rate  Speech Volume: Increased    Mood and Affect  Mood: Irritable; Labile  Affect: Labile   Thought Process  Thought Processes: Coherent  Orientation:Full (Time, Place and Person)  Thought Content:Illogical  Hallucinations:Hallucinations: None  Ideas of Reference:None  Suicidal Thoughts:Suicidal Thoughts: No  Homicidal Thoughts:Homicidal Thoughts: No   Sensorium  Memory: Immediate Fair; Recent Fair  Judgment: Impaired  Insight: Shallow   Executive Functions  Concentration: Fair  Attention Span: Fair  Recall: [employer] of Knowledge: Fair  Language: Fair   Psychomotor Activity  Psychomotor Activity: Psychomotor Activity: Normal  Musculoskeletal: Strength & Muscle Tone: within normal limits Gait & Station: normal Assets  Assets: Manufacturing Systems Engineer; Desire for Improvement; Resilience    Physical Exam: Physical Exam ROS Blood pressure 119/79, pulse (!) 106, temperature 97.9 F (36.6 C), temperature source Oral, resp. rate 18, height 5' 7 (1.702 m), weight 75.8 kg, SpO2 98%. Body mass index is 26.16 kg/m.  Diagnosis: Principal Problem:  MDD (major depressive disorder), recurrent severe, without psychosis (HCC)   PLAN: Safety and Monitoring:  -- Voluntary admission to inpatient psychiatric unit for safety, stabilization and treatment  -- Daily contact with patient to assess and evaluate symptoms and progress in treatment  -- Patient's case to be discussed in multi-disciplinary team meeting  -- Observation Level : q15 minute checks  -- Vital signs:  q12 hours  -- Precautions: suicide, elopement, and assault -- Encouraged  patient to participate in unit milieu and in scheduled group therapies  2. Psychiatric Treatment:  Scheduled Medications: Continue her home medications Xanax  0.5 3 times daily, Abilify  5 mg daily, prazosin  5 mg nightly as needed, Effexor  XR 150 mg daily    -- The risks/benefits/side-effects/alternatives to this medication were discussed in detail with the patient and time was given for questions. The patient consents to medication trial.  3. Medical Issues Being Addressed:     4. Discharge Planning:   -- Social work and case management to assist with discharge planning and identification of hospital follow-up needs prior to discharge  -- Estimated LOS: 3-4 days  Camelia JINNY Mountain, NP 05/17/2024, 10:00 AM

## 2024-05-17 NOTE — Progress Notes (Addendum)
 The patient was observed attempting to conceal prescribed evening medications. A pocket search was conducted, and the medications were recovered. When questioned, the patient attempted to normalize the behavior, stating, I always keep some to take later. It is recommended that mouth checks be conducted consistently during medication administration to ensure compliance.   05/17/24 0323  Psych Admission Type (Psych Patients Only)  Admission Status Involuntary  Psychosocial Assessment  Patient Complaints Agitation;Anger;Irritability  Eye Contact Brief  Facial Expression Flat  Affect Angry;Irritable  Speech Aggressive  Interaction Poor;Hostile;Defensive  Motor Activity Slow  Appearance/Hygiene Unremarkable  Behavior Characteristics Agressive verbally;Unwilling to participate  Mood Preoccupied;Irritable  Aggressive Behavior  Effect No apparent injury  Thought Process  Coherency WDL  Content Preoccupation  Delusions None reported or observed  Perception WDL  Hallucination None reported or observed  Judgment Poor  Confusion None  Danger to Self  Current suicidal ideation? Denies  Danger to Others  Danger to Others None reported or observed

## 2024-05-17 NOTE — Group Note (Signed)
 Date:  05/17/2024 Time:  5:34 PM  Group Topic/Focus:  Orientation:   The focus of this group is to educate the patient on the purpose and policies of crisis stabilization and provide a format to answer questions about their admission.  The group details unit policies and expectations of patients while admitted.    Participation Level:  Active  Participation Quality:  Appropriate  Affect:  Appropriate  Cognitive:  Appropriate  Insight: Appropriate  Engagement in Group:  Engaged  Modes of Intervention:  Activity  Additional Comments:    Clayborne DELENA June 05/17/2024, 5:34 PM

## 2024-05-18 DIAGNOSIS — F332 Major depressive disorder, recurrent severe without psychotic features: Secondary | ICD-10-CM | POA: Diagnosis not present

## 2024-05-18 MED ORDER — PROMETHAZINE HCL 25 MG PO TABS
25.0000 mg | ORAL_TABLET | Freq: Four times a day (QID) | ORAL | Status: DC | PRN
  Administered 2024-05-18 (×2): 25 mg via ORAL
  Filled 2024-05-18 (×3): qty 1

## 2024-05-18 MED ORDER — PROMETHAZINE HCL 6.25 MG/5ML PO SOLN: 12.5000 mg | Freq: Four times a day (QID) | ORAL | Status: DC | PRN

## 2024-05-18 NOTE — Group Note (Signed)
 Date:  05/18/2024 Time:  11:54 AM  Group Topic/Focus:  Goals Group:   The focus of this group is to help patients establish daily goals to achieve during treatment and discuss how the patient can incorporate goal setting into their daily lives to aide in recovery.    Participation Level:  Active  Participation Quality:  Appropriate  Affect:  Appropriate  Cognitive:  Appropriate  Insight: Appropriate  Engagement in Group:  Engaged  Modes of Intervention:  Activity  Additional Comments:    Camellia HERO Chaniece Barbato 05/18/2024, 11:54 AM

## 2024-05-18 NOTE — Progress Notes (Signed)
" °   05/18/24 0308  Psych Admission Type (Psych Patients Only)  Admission Status Involuntary  Psychosocial Assessment  Patient Complaints Anxiety;Irritability  Eye Contact Brief;Fair  Facial Expression Anxious  Affect Appropriate to circumstance  Speech Logical/coherent  Interaction Assertive  Motor Activity Other (Comment) (Normal)  Appearance/Hygiene Unremarkable  Behavior Characteristics Agitated;Cooperative  Mood Anxious  Thought Process  Coherency WDL  Content Preoccupation  Delusions None reported or observed  Perception WDL  Hallucination None reported or observed  Judgment Limited  Confusion None  Danger to Self  Current suicidal ideation? Denies  Danger to Others  Danger to Others None reported or observed    "

## 2024-05-18 NOTE — Group Note (Signed)
 Date:  05/18/2024 Time:  8:45 PM  Group Topic/Focus:  Making Healthy Choices:   The focus of this group is to help patients identify negative/unhealthy choices they were using prior to admission and identify positive/healthier coping strategies to replace them upon discharge. Self Care:   The focus of this group is to help patients understand the importance of self-care in order to improve or restore emotional, physical, spiritual, interpersonal, and financial health. Wrap-Up Group:   The focus of this group is to help patients review their daily goal of treatment and discuss progress on daily workbooks.    Participation Level:  Active  Participation Quality:  Appropriate and Attentive  Affect:  Appropriate  Cognitive:  Alert, Appropriate, and Oriented  Insight: Appropriate and Good  Engagement in Group:  Engaged  Modes of Intervention:  Discussion and Support  Additional Comments:  N/A  [name] 05/18/2024, 8:45 PM

## 2024-05-18 NOTE — Plan of Care (Signed)
  Problem: Education: Goal: Mental status will improve Outcome: Progressing   

## 2024-05-18 NOTE — Plan of Care (Signed)
 Jordan Foster is a 44 y.o. female patient. No diagnosis found. Past Medical History:  Diagnosis Date   Asthma    Depression    Hypertension    Positive TB test    Current Facility-Administered Medications  Medication Dose Route Frequency Provider Last Rate Last Admin   acetaminophen  (TYLENOL ) tablet 650 mg  650 mg Oral Q6H PRN Mannie Jerel PARAS, NP       albuterol  (VENTOLIN  HFA) 108 (90 Base) MCG/ACT inhaler 2 puff  2 puff Inhalation Q4H PRN Mannie Jerel PARAS, NP       ALPRAZolam  (XANAX ) tablet 0.5 mg  0.5 mg Oral TID Mannie Jerel PARAS, NP   0.5 mg at 05/17/24 1706   alum & mag hydroxide-simeth (MAALOX/MYLANTA) 200-200-20 MG/5ML suspension 30 mL  30 mL Oral Q4H PRN Mannie Jerel PARAS, NP       ARIPiprazole  (ABILIFY ) tablet 5 mg  5 mg Oral QHS Mannie Jerel PARAS, NP   5 mg at 05/17/24 2117   haloperidol  (HALDOL ) tablet 5 mg  5 mg Oral TID PRN Mannie Jerel PARAS, NP       And   diphenhydrAMINE  (BENADRYL ) capsule 50 mg  50 mg Oral TID PRN Mannie Jerel PARAS, NP       haloperidol  lactate (HALDOL ) injection 10 mg  10 mg Intramuscular TID PRN Mannie Jerel PARAS, NP       And   diphenhydrAMINE  (BENADRYL ) injection 50 mg  50 mg Intramuscular TID PRN Mannie Jerel PARAS, NP       And   LORazepam  (ATIVAN ) injection 2 mg  2 mg Intramuscular TID PRN Mannie Jerel PARAS, NP       haloperidol  lactate (HALDOL ) injection 5 mg  5 mg Intramuscular TID PRN Mannie Jerel PARAS, NP       And   diphenhydrAMINE  (BENADRYL ) injection 50 mg  50 mg Intramuscular TID PRN Mannie Jerel PARAS, NP       And   LORazepam  (ATIVAN ) injection 2 mg  2 mg Intramuscular TID PRN Mannie Jerel PARAS, NP       fluticasone  furoate-vilanterol (BREO ELLIPTA ) 200-25 MCG/ACT 1 puff  1 puff Inhalation Daily Mannie Jerel PARAS, NP   1 puff at 05/17/24 9178   gabapentin  (NEURONTIN ) capsule 800 mg  800 mg Oral 2 times per day Mannie Jerel PARAS, NP   800 mg at 05/17/24 1305   And   gabapentin  (NEURONTIN ) capsule 1,200 mg  1,200 mg Oral QHS Mannie Jerel PARAS,  NP   1,200 mg at 05/17/24 2112   hydrOXYzine  (ATARAX ) tablet 25 mg  25 mg Oral TID PRN Montague, Crystal J, NP   25 mg at 05/17/24 2113   loratadine  (CLARITIN ) tablet 10 mg  10 mg Oral Daily Mannie Jerel PARAS, NP   10 mg at 05/17/24 9180   losartan  (COZAAR ) tablet 100 mg  100 mg Oral Daily Mannie Jerel PARAS, NP   100 mg at 05/17/24 0825   magnesium  hydroxide (MILK OF MAGNESIA) suspension 30 mL  30 mL Oral Daily PRN Mannie Jerel PARAS, NP       nicotine  (NICODERM CQ  - dosed in mg/24 hours) patch 21 mg  21 mg Transdermal Q0600 Mannie Jerel PARAS, NP   21 mg at 05/18/24 9379   nicotine  polacrilex (NICORETTE ) gum 2 mg  2 mg Oral PRN Jadapalle, Sree, MD   2 mg at 05/17/24 2120   pantoprazole  (PROTONIX ) EC tablet 40 mg  40 mg Oral Daily Mannie Jerel PARAS, NP   40  mg at 05/17/24 9180   prazosin  (MINIPRESS ) capsule 5 mg  5 mg Oral QHS Montague, Crystal J, NP   5 mg at 05/17/24 2113   propranolol  ER (INDERAL  LA) 24 hr capsule 60 mg  60 mg Oral Daily Mannie Jerel PARAS, NP   60 mg at 05/17/24 0825   venlafaxine  XR (EFFEXOR -XR) 24 hr capsule 150 mg  150 mg Oral Q breakfast Mannie Jerel PARAS, NP   150 mg at 05/17/24 9180   Allergies[1] Principal Problem:   MDD (major depressive disorder), recurrent severe, without psychosis (HCC)  Blood pressure 123/82, pulse (!) 103, temperature 98.4 F (36.9 C), temperature source Oral, resp. rate 19, height 5' 7 (1.702 m), weight 75.8 kg, SpO2 98%.   Jordan Foster 05/18/2024      [1]  Allergies Allergen Reactions   Latex Itching   Humira (1 Pen) [Adalimumab] Other (See Comments)    Abnormal vision, Muscle pain, Fatigue, Headache   Pollen Extract     Sneezing, headache

## 2024-05-18 NOTE — Progress Notes (Signed)
 Patient ID: Jordan Foster, female   DOB: 03/15/1981, 44 y.o.   MRN: [id number] [facility] MD Progress Note  05/18/2024 10:25 PM Jordan Foster  MRN:  [id number]   Aston A Albaugh is a 44 y.o. female with MDD, well, hypertension, asthma, cocaine abuse presenting for suicidal ideation. Brought in by [facility] and patient did have a loaded gun and reported that she was planned to use it but she states I am too much of a coward to pull the trigger. She states she has been a few months sober but did start drinking today and also used cocaine in the last hour .Patient is admitted to psych unit with Q15 min safety monitoring. Multidisciplinary team approach is offered. Medication management; group/milieu therapy is offered.      Subjective:  Chart reviewed, case discussed in multidisciplinary meeting, patient seen during rounds.    05/18/2024: Patient is assessed on the inpatient unit. She presents with labile mood due to her pending discharge. Explained to patient that the weather prevents safe discharge at this time. Continues to have the same concerns previously stated although she was advised that she could be provided documentation. She denies depressive or anxiety symptoms at this time. Denies suicidal or homicidal thoughts and does not appear to be internally preoccupied. Patient has been minimizing her symptoms since her admission and continues to have poor insight regarding her mental health. Reports that she is sleeping and eating well and has been compliant with her medications regimen.   05/17/2024: Patient was seen this in bed morning for psychiatric reassessment during clinical rounds. Patient is alert and oriented X 4, calm, cooperative, and engages well in the interview. Patient reports that she wanted to apologize for her behavior towards the psychiatrist yesterday due to being upset and wanting to be discharged. Patient states she is worried about losing her employment and experiencing  financial challenges. Patient reports she realizes she was stupid for what I did and she will not drink alcohol  again. However, patient endorses her anxiety and depression is manageable, and rates them both a 0 out of 10. Patient is observed minimizing her symptoms, lacking insight and judgment. She reports she has a normal appetite and sleep pattern is adequate. Patient denies suicidal or homicidal ideations or perceptual disturbances today. Will continue to monitor.    Past Psychiatric History: see h&P Family History:  Family History  Problem Relation Age of Onset   Hypertension Mother    Social History:  Social History   Substance and Sexual Activity  Alcohol  Use Yes   Alcohol /week: 35.0 standard drinks of alcohol    Types: 35 Shots of liquor per week   Comment: 10 - 12 shots/daily     Social History   Substance and Sexual Activity  Drug Use Not Currently   Types: Other-see comments   Comment: THC drink daily for sleep    Social History   Socioeconomic History   Marital status: Single    Spouse name: Not on file   Number of children: 0   Years of education: 16   Highest education level: Not on file  Occupational History   Occupation: [employer]   Tobacco Use   Smoking status: Never   Smokeless tobacco: Never  Vaping Use   Vaping status: Every Day   Substances: Nicotine   Substance and Sexual Activity   Alcohol  use: Yes    Alcohol /week: 35.0 standard drinks of alcohol     Types: 35 Shots of liquor per week  Comment: 10 - 12 shots/daily   Drug use: Not Currently    Types: Other-see comments    Comment: THC drink daily for sleep   Sexual activity: Yes    Comment: W/ Female Partner  Other Topics Concern   Not on file  Social History Narrative   Fun/Hobby: Working to determine   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[employer]   Tobacco Use: Low Risk (05/15/2024)   Patient History    Smoking Tobacco Use: Never    Smokeless Tobacco Use: Never    Passive  Exposure: Not on file  Financial Resource Strain: Low Risk (05/02/2023)   Received from [facility]   Overall Financial Resource Strain (CARDIA)    Difficulty of Paying Living Expenses: Not hard at all  Food Insecurity: No Food Insecurity (05/15/2024)   Epic    Worried About Radiation Protection Practitioner of Food in the Last Year: Never true    Ran Out of Food in the Last Year: Never true  Transportation Needs: No Transportation Needs (05/15/2024)   Epic    Lack of Transportation (Medical): No    Lack of Transportation (Non-Medical): No  Physical Activity: Not on file  Stress: Not on file  Social Connections: Not on file  Depression (PHQ2-9): High Risk (09/03/2021)   Depression (PHQ2-9)    PHQ-2 Score: 19  Alcohol  Screen: Low Risk (05/15/2024)   Alcohol  Screen    Last Alcohol  Screening Score (AUDIT): 1  Housing: Low Risk (05/15/2024)   Epic    Unable to Pay for Housing in the Last Year: No    Number of Times Moved in the Last Year: 0    Homeless in the Last Year: No  Utilities: Not At Risk (05/15/2024)   Epic    Threatened with loss of utilities: No  Health Literacy: Not on file   Past Medical History:  Past Medical History:  Diagnosis Date   Asthma    Depression    Hypertension    Positive TB test     Past Surgical History:  Procedure Laterality Date   LASIK     NASAL SINUS SURGERY      Current Medications: Current Facility-Administered Medications  Medication Dose Route Frequency Provider Last Rate Last Admin   acetaminophen  (TYLENOL ) tablet 650 mg  650 mg Oral Q6H PRN Mannie Jerel PARAS, NP       albuterol  (VENTOLIN  HFA) 108 (90 Base) MCG/ACT inhaler 2 puff  2 puff Inhalation Q4H PRN Mannie Jerel PARAS, NP       ALPRAZolam  (XANAX ) tablet 0.5 mg  0.5 mg Oral TID Mannie Jerel PARAS, NP   0.5 mg at 05/18/24 1817   alum & mag hydroxide-simeth (MAALOX/MYLANTA) 200-200-20 MG/5ML suspension 30 mL  30 mL Oral Q4H PRN Mannie Jerel PARAS, NP       ARIPiprazole  (ABILIFY ) tablet 5 mg  5 mg Oral QHS  Mannie Jerel PARAS, NP   5 mg at 05/18/24 2106   haloperidol  (HALDOL ) tablet 5 mg  5 mg Oral TID PRN Mannie Jerel PARAS, NP       And   diphenhydrAMINE  (BENADRYL ) capsule 50 mg  50 mg Oral TID PRN Mannie Jerel PARAS, NP       haloperidol  lactate (HALDOL ) injection 10 mg  10 mg Intramuscular TID PRN Mannie Jerel PARAS, NP       And   diphenhydrAMINE  (BENADRYL ) injection 50 mg  50 mg Intramuscular TID PRN Mannie Jerel PARAS, NP       And  LORazepam  (ATIVAN ) injection 2 mg  2 mg Intramuscular TID PRN Mannie Jerel PARAS, NP       haloperidol  lactate (HALDOL ) injection 5 mg  5 mg Intramuscular TID PRN Mannie Jerel PARAS, NP       And   diphenhydrAMINE  (BENADRYL ) injection 50 mg  50 mg Intramuscular TID PRN Mannie Jerel PARAS, NP       And   LORazepam  (ATIVAN ) injection 2 mg  2 mg Intramuscular TID PRN Mannie Jerel PARAS, NP       fluticasone  furoate-vilanterol (BREO ELLIPTA ) 200-25 MCG/ACT 1 puff  1 puff Inhalation Daily Mannie Jerel PARAS, NP   1 puff at 05/18/24 9075   gabapentin  (NEURONTIN ) capsule 800 mg  800 mg Oral 2 times per day Mannie Jerel PARAS, NP   800 mg at 05/18/24 1329   And   gabapentin  (NEURONTIN ) capsule 1,200 mg  1,200 mg Oral QHS Mannie Jerel PARAS, NP   1,200 mg at 05/18/24 2105   hydrOXYzine  (ATARAX ) tablet 25 mg  25 mg Oral TID PRN Montague, Crystal J, NP   25 mg at 05/18/24 2106   loratadine  (CLARITIN ) tablet 10 mg  10 mg Oral Daily Mannie Jerel PARAS, NP   10 mg at 05/18/24 9076   losartan  (COZAAR ) tablet 100 mg  100 mg Oral Daily Mannie Jerel PARAS, NP   100 mg at 05/18/24 9076   magnesium  hydroxide (MILK OF MAGNESIA) suspension 30 mL  30 mL Oral Daily PRN Mannie Jerel PARAS, NP       nicotine  (NICODERM CQ  - dosed in mg/24 hours) patch 21 mg  21 mg Transdermal Q0600 Mannie Jerel PARAS, NP   21 mg at 05/18/24 9379   nicotine  polacrilex (NICORETTE ) gum 2 mg  2 mg Oral PRN Jadapalle, Sree, MD   2 mg at 05/18/24 1652   pantoprazole  (PROTONIX ) EC tablet 40 mg  40 mg Oral Daily Mannie Jerel PARAS, NP   40 mg  at 05/18/24 9076   prazosin  (MINIPRESS ) capsule 5 mg  5 mg Oral QHS Montague, Crystal J, NP   5 mg at 05/18/24 2105   promethazine  (PHENERGAN ) 6.25 MG/5ML solution 12.5 mg  12.5 mg Oral Q6H PRN Jadapalle, Sree, MD       promethazine  (PHENERGAN ) tablet 25 mg  25 mg Oral Q6H PRN Jacynda Brunke B, NP   25 mg at 05/18/24 2106   propranolol  ER (INDERAL  LA) 24 hr capsule 60 mg  60 mg Oral Daily Mannie Jerel PARAS, NP   60 mg at 05/18/24 9074   venlafaxine  XR (EFFEXOR -XR) 24 hr capsule 150 mg  150 mg Oral Q breakfast Mannie Jerel PARAS, NP   150 mg at 05/18/24 9076    Lab Results: No results found for this or any previous visit (from the past 48 hours).  Blood Alcohol  level:  Lab Results  Component Value Date   ETH 185 (H) 05/14/2024   ETH 330 (HH) 06/20/2023    Metabolic Disorder Labs: Lab Results  Component Value Date   HGBA1C 5.2 09/03/2021   MPG 102.54 09/03/2021   No results found for: PROLACTIN Lab Results  Component Value Date   CHOL 230 (H) 09/03/2021   TRIG 77 09/03/2021   HDL 130 09/03/2021   CHOLHDL 1.8 09/03/2021   VLDL 15 09/03/2021   LDLCALC 85 09/03/2021    Physical Findings: AIMS:  , ,  ,  ,    CIWA:  CIWA-Ar Total: 3 COWS:      Psychiatric Specialty Exam:  Presentation  General Appearance:  Appropriate for Environment  Eye Contact: Good  Speech: Clear and Coherent; Normal Rate  Speech Volume: Normal    Mood and Affect  Mood: Irritable  Affect: Labile   Thought Process  Thought Processes: Coherent  Orientation:Full (Time, Place and Person)  Thought Content:WDL  Hallucinations:Hallucinations: None  Ideas of Reference:None  Suicidal Thoughts:Suicidal Thoughts: No  Homicidal Thoughts:Homicidal Thoughts: No   Sensorium  Memory: Immediate Good; Recent Good; Remote Good  Judgment: Fair  Insight: Poor   Executive Functions  Concentration: Fair  Attention Span: Fair  Recall: Good  Fund of  Knowledge: Good  Language: Good   Psychomotor Activity  Psychomotor Activity:No data recorded Musculoskeletal: Strength & Muscle Tone: within normal limits Gait & Station: normal Assets  Assets: Manufacturing Systems Engineer; Housing; Social Support; Vocational/Educational    Physical Exam: Physical Exam Review of Systems  Constitutional: Negative.   HENT: Negative.    Eyes: Negative.   Respiratory: Negative.    Cardiovascular: Negative.   Gastrointestinal: Negative.   Genitourinary: Negative.   Musculoskeletal: Negative.   Skin: Negative.   Neurological: Negative.   Endo/Heme/Allergies: Negative.   Psychiatric/Behavioral:  Positive for depression.    Blood pressure (!) 153/84, pulse 88, temperature 98.4 F (36.9 C), temperature source Oral, resp. rate 19, height 5' 7 (1.702 m), weight 75.8 kg, SpO2 100%. Body mass index is 26.16 kg/m.  Diagnosis: Principal Problem:   MDD (major depressive disorder), recurrent severe, without psychosis (HCC)   PLAN: Safety and Monitoring:  -- Voluntary admission to inpatient psychiatric unit for safety, stabilization and treatment  -- Daily contact with patient to assess and evaluate symptoms and progress in treatment  -- Patient's case to be discussed in multi-disciplinary team meeting  -- Observation Level : q15 minute checks  -- Vital signs:  q12 hours  -- Precautions: suicide, elopement, and assault -- Encouraged patient to participate in unit milieu and in scheduled group therapies  2. Psychiatric Treatment:  Scheduled Medications: Continue abilify  5 mg    -- The risks/benefits/side-effects/alternatives to this medication were discussed in detail with the patient and time was given for questions. The patient consents to medication trial.  3. Medical Issues Being Addressed:  none   4. Discharge Planning:   -- Social work and case management to assist with discharge planning and identification of hospital follow-up needs prior  to discharge  -- Estimated LOS: 3-4 days  Daine KATHEE Ober, NP 05/18/2024, 10:25 PM

## 2024-05-19 MED ORDER — NICOTINE POLACRILEX 2 MG MT GUM: 2.0000 mg | CHEWING_GUM | OROMUCOSAL | 0 refills | Status: AC | PRN
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
## 2024-05-19 NOTE — [facility] Suicide Risk Assessment (Cosign Needed)
 [facility] Discharge Suicide Risk Assessment   Principal Problem: MDD (major depressive disorder), recurrent severe, without psychosis (HCC) Discharge Diagnoses: Principal Problem:   MDD (major depressive disorder), recurrent severe, without psychosis (HCC)   Total Time spent with patient: 45 minutes  Musculoskeletal: Strength & Muscle Tone: within normal limits Gait & Station: normal Patient leans: N/A  Psychiatric Specialty Exam  Presentation  General Appearance:  Appropriate for Environment; Casual  Eye Contact: Good  Speech: Clear and Coherent; Normal Rate  Speech Volume: Normal  Handedness:No data recorded  Mood and Affect  Mood: Euthymic  Duration of Depression Symptoms: No data recorded Affect: Congruent; Appropriate   Thought Process  Thought Processes: Coherent; Goal Directed; Linear  Descriptions of Associations:Intact  Orientation:Full (Time, Place and Person)  Thought Content:WDL  History of Schizophrenia/Schizoaffective disorder:No  Duration of Psychotic Symptoms:No data recorded Hallucinations:Hallucinations: None  Ideas of Reference:None  Suicidal Thoughts:Suicidal Thoughts: No  Homicidal Thoughts:Homicidal Thoughts: No   Sensorium  Memory: Immediate Good; Recent Good; Remote Good  Judgment: Fair  Insight: Fair   Art Therapist  Concentration: Fair  Attention Span: Fair  Recall: Good  Fund of Knowledge: Good  Language: Good   Psychomotor Activity  Psychomotor Activity: Psychomotor Activity: Normal   Assets  Assets: Communication Skills; Housing; Health And Safety Inspector; Vocational/Educational; Social Support   Sleep  Sleep: Sleep: Good  Estimated Sleeping Duration (Last 24 Hours): 7.00-8.50 hours  Physical Exam: Physical Exam Vitals and nursing note reviewed.  Constitutional:      Appearance: Normal appearance.  Pulmonary:     Effort: Pulmonary effort is normal.  Neurological:      Mental Status: She is alert and oriented to person, place, and time.  Psychiatric:        Mood and Affect: Mood normal.        Behavior: Behavior normal.        Thought Content: Thought content normal.    Review of Systems  Respiratory:  Negative for shortness of breath.   Cardiovascular:  Negative for chest pain.  Gastrointestinal:  Negative for diarrhea, nausea and vomiting.  Psychiatric/Behavioral:  Positive for substance abuse. Negative for depression, hallucinations and suicidal ideas. The patient is not nervous/anxious.   All other systems reviewed and are negative.  Blood pressure (!) 122/91, pulse (!) 102, temperature 98.1 F (36.7 C), temperature source Oral, resp. rate 18, height 5' 7 (1.702 m), weight 75.8 kg, SpO2 100%. Body mass index is 26.16 kg/m.  Mental Status Per Nursing Assessment::   On Admission:  NA  Demographic Factors:  Caucasian, Gay, lesbian, or bisexual orientation, Living alone, and Access to firearms  Loss Factors: Loss of significant relationship  Historical Factors: Family history of mental illness or substance abuse and Impulsivity  Risk Reduction Factors:   Employed  Continued Clinical Symptoms:  Alcohol /Substance Abuse/Dependencies Previous Psychiatric Diagnoses and Treatments  Cognitive Features That Contribute To Risk:  None    Suicide Risk:  Minimal: No identifiable suicidal ideation.  Patients presenting with no risk factors but with morbid ruminations; Zakry Caso be classified as minimal risk based on the severity of the depressive symptoms   Follow-up Information     [facility] Follow up.   Why: Appointment is scheduled for 05/22/2024 at 2:45PM.  Appointment is virtual. Contact information: [address] [place] [address] [phone]                 Plan Of Care/Follow-up recommendations:   Follow up with providers listed above  Lita Flynn,  NP 05/19/2024, 9:34 PM

## 2024-05-19 NOTE — [facility] Suicide Risk Assessment (Signed)
 [facility] INPATIENT:  Family/Significant Other Suicide Prevention Education  Suicide Prevention Education:  Contact Attempts: Warren GLADE [phone],  has been identified by the patient as the family member/significant other with whom the patient will be residing, and identified as the person(s) who will aid the patient in the event of a mental health crisis.  With written consent from the patient, two attempts were made to provide suicide prevention education, prior to and/or following the patient's discharge.  We were unsuccessful in providing suicide prevention education.  A suicide education pamphlet was given to the patient to share with family/significant other.  Date and time of first attempt: 05/19/2024 at 3:05PM Date and time of second attempt: Second attempt is needed  CSW left HIPAA compliant voicemail requesting a return phone call.  Sherryle JINNY Margo 05/19/2024, 3:04 PM

## 2024-05-19 NOTE — Plan of Care (Signed)

## 2024-05-19 NOTE — Progress Notes (Signed)
" °   05/19/24 1100  Psych Admission Type (Psych Patients Only)  Admission Status Involuntary  Psychosocial Assessment  Patient Complaints None  Eye Contact Fair  Facial Expression Flat  Affect Flat  Speech Logical/coherent  Interaction Assertive  Motor Activity Other (Comment) (Appropriate for situation)  Appearance/Hygiene Unremarkable  Behavior Characteristics Cooperative  Mood Pleasant  Thought Process  Coherency WDL  Content WDL  Delusions None reported or observed  Perception WDL  Hallucination None reported or observed  Judgment Impaired  Confusion None  Danger to Self  Current suicidal ideation? Denies  Danger to Others  Danger to Others None reported or observed    "

## 2024-05-19 NOTE — Progress Notes (Signed)
 Patient ID: Jordan Foster, female   DOB: 1980/12/05, 44 y.o.   MRN: [id number] [facility] MD Progress Note  05/19/2024 1:51 PM Jordan Foster  MRN:  [id number]   Jordan Foster is a 44 y.o. female with MDD, well, hypertension, asthma, cocaine abuse presenting for suicidal ideation. Brought in by [facility] and patient did have a loaded gun and reported that she was planned to use it but she states I am too much of a coward to pull the trigger. She states she has been a few months sober but did start drinking today and also used cocaine in the last hour .Patient is admitted to psych unit with Q15 min safety monitoring. Multidisciplinary team approach is offered. Medication management; group/milieu therapy is offered.      Subjective:  Chart reviewed, case discussed in multidisciplinary meeting, patient seen during rounds.   1/26/206: Patient was found in her room during rounds.  She reports that she is feeling okay.  She is discharged focused and expresses wanting to get home to her job and her dog.  She reports that she feels the medication is doing okay for her.  She denies SI HI and AVH.  She is currently connected to [facility] and would like to continue outpatient services with them.  Discussed presentation and patient acknowledges the severity of her choices.  She endorses that she realizes that it was impulsivity related to her breaking sobriety.  She denies all other concerns.   05/18/2024: Patient is assessed on the inpatient unit. She presents with labile mood due to her pending discharge. Explained to patient that the weather prevents safe discharge at this time. Continues to have the same concerns previously stated although she was advised that she could be provided documentation. She denies depressive or anxiety symptoms at this time. Denies suicidal or homicidal thoughts and does not appear to be internally preoccupied. Patient has been minimizing her symptoms since her admission and  continues to have poor insight regarding her mental health. Reports that she is sleeping and eating well and has been compliant with her medications regimen.   05/17/2024: Patient was seen this in bed morning for psychiatric reassessment during clinical rounds. Patient is alert and oriented X 4, calm, cooperative, and engages well in the interview. Patient reports that she wanted to apologize for her behavior towards the psychiatrist yesterday due to being upset and wanting to be discharged. Patient states she is worried about losing her employment and experiencing financial challenges. Patient reports she realizes she was stupid for what I did and she will not drink alcohol  again. However, patient endorses her anxiety and depression is manageable, and rates them both a 0 out of 10. Patient is observed minimizing her symptoms, lacking insight and judgment. She reports she has a normal appetite and sleep pattern is adequate. Patient denies suicidal or homicidal ideations or perceptual disturbances today. Will continue to monitor.    Past Psychiatric History: see h&P Family History:  Family History  Problem Relation Age of Onset   Hypertension Mother    Social History:  Social History   Substance and Sexual Activity  Alcohol  Use Yes   Alcohol /week: 35.0 standard drinks of alcohol    Types: 35 Shots of liquor per week   Comment: 10 - 12 shots/daily     Social History   Substance and Sexual Activity  Drug Use Not Currently   Types: Other-see comments   Comment: THC drink daily for sleep    Social History  Socioeconomic History   Marital status: Single    Spouse name: Not on file   Number of children: 0   Years of education: 75   Highest education level: Not on file  Occupational History   Occupation: [employer]   Tobacco Use   Smoking status: Never   Smokeless tobacco: Never  Vaping Use   Vaping status: Every Day   Substances: Nicotine   Substance and Sexual Activity    Alcohol  use: Yes    Alcohol /week: 35.0 standard drinks of alcohol     Types: 35 Shots of liquor per week    Comment: 10 - 12 shots/daily   Drug use: Not Currently    Types: Other-see comments    Comment: THC drink daily for sleep   Sexual activity: Yes    Comment: W/ Female Partner  Other Topics Concern   Not on file  Social History Narrative   Fun/Hobby: Working to determine   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[employer]   Tobacco Use: Low Risk (05/15/2024)   Patient History    Smoking Tobacco Use: Never    Smokeless Tobacco Use: Never    Passive Exposure: Not on file  Financial Resource Strain: Low Risk (05/02/2023)   Received from [facility]   Overall Financial Resource Strain (CARDIA)    Difficulty of Paying Living Expenses: Not hard at all  Food Insecurity: No Food Insecurity (05/15/2024)   Epic    Worried About Radiation Protection Practitioner of Food in the Last Year: Never true    Ran Out of Food in the Last Year: Never true  Transportation Needs: No Transportation Needs (05/15/2024)   Epic    Lack of Transportation (Medical): No    Lack of Transportation (Non-Medical): No  Physical Activity: Not on file  Stress: Not on file  Social Connections: Not on file  Depression (PHQ2-9): High Risk (09/03/2021)   Depression (PHQ2-9)    PHQ-2 Score: 19  Alcohol  Screen: Low Risk (05/15/2024)   Alcohol  Screen    Last Alcohol  Screening Score (AUDIT): 1  Housing: Low Risk (05/15/2024)   Epic    Unable to Pay for Housing in the Last Year: No    Number of Times Moved in the Last Year: 0    Homeless in the Last Year: No  Utilities: Not At Risk (05/15/2024)   Epic    Threatened with loss of utilities: No  Health Literacy: Not on file   Past Medical History:  Past Medical History:  Diagnosis Date   Asthma    Depression    Hypertension    Positive TB test     Past Surgical History:  Procedure Laterality Date   LASIK     NASAL SINUS SURGERY      Current Medications: Current  Facility-Administered Medications  Medication Dose Route Frequency Provider Last Rate Last Admin   acetaminophen  (TYLENOL ) tablet 650 mg  650 mg Oral Q6H PRN Mannie Jerel PARAS, NP       albuterol  (VENTOLIN  HFA) 108 (90 Base) MCG/ACT inhaler 2 puff  2 puff Inhalation Q4H PRN Mannie Jerel PARAS, NP       ALPRAZolam  (XANAX ) tablet 0.5 mg  0.5 mg Oral TID Mannie Jerel PARAS, NP   0.5 mg at 05/19/24 1242   alum & mag hydroxide-simeth (MAALOX/MYLANTA) 200-200-20 MG/5ML suspension 30 mL  30 mL Oral Q4H PRN Mannie Jerel PARAS, NP       ARIPiprazole  (ABILIFY ) tablet 5 mg  5 mg Oral QHS Mannie Jerel  J, NP   5 mg at 05/18/24 2106   haloperidol  (HALDOL ) tablet 5 mg  5 mg Oral TID PRN Mannie Jerel PARAS, NP       And   diphenhydrAMINE  (BENADRYL ) capsule 50 mg  50 mg Oral TID PRN Mannie Jerel PARAS, NP       haloperidol  lactate (HALDOL ) injection 10 mg  10 mg Intramuscular TID PRN Mannie Jerel PARAS, NP       And   diphenhydrAMINE  (BENADRYL ) injection 50 mg  50 mg Intramuscular TID PRN Mannie Jerel PARAS, NP       And   LORazepam  (ATIVAN ) injection 2 mg  2 mg Intramuscular TID PRN Mannie Jerel PARAS, NP       haloperidol  lactate (HALDOL ) injection 5 mg  5 mg Intramuscular TID PRN Mannie Jerel PARAS, NP       And   diphenhydrAMINE  (BENADRYL ) injection 50 mg  50 mg Intramuscular TID PRN Mannie Jerel PARAS, NP       And   LORazepam  (ATIVAN ) injection 2 mg  2 mg Intramuscular TID PRN Mannie Jerel PARAS, NP       fluticasone  furoate-vilanterol (BREO ELLIPTA ) 200-25 MCG/ACT 1 puff  1 puff Inhalation Daily Mannie Jerel PARAS, NP   1 puff at 05/19/24 0800   gabapentin  (NEURONTIN ) capsule 800 mg  800 mg Oral 2 times per day Mannie Jerel PARAS, NP   800 mg at 05/19/24 1045   And   gabapentin  (NEURONTIN ) capsule 1,200 mg  1,200 mg Oral QHS Mannie Jerel PARAS, NP   1,200 mg at 05/18/24 2105   hydrOXYzine  (ATARAX ) tablet 25 mg  25 mg Oral TID PRN Montague, Crystal J, NP   25 mg at 05/19/24 1241   loratadine  (CLARITIN ) tablet 10 mg  10 mg Oral  Daily Mannie Jerel PARAS, NP   10 mg at 05/19/24 0800   losartan  (COZAAR ) tablet 100 mg  100 mg Oral Daily Mannie Jerel PARAS, NP   100 mg at 05/19/24 1045   magnesium  hydroxide (MILK OF MAGNESIA) suspension 30 mL  30 mL Oral Daily PRN Mannie Jerel PARAS, NP       nicotine  (NICODERM CQ  - dosed in mg/24 hours) patch 21 mg  21 mg Transdermal Q0600 Mannie Jerel PARAS, NP   21 mg at 05/19/24 9384   nicotine  polacrilex (NICORETTE ) gum 2 mg  2 mg Oral PRN Jadapalle, Sree, MD   2 mg at 05/19/24 1242   pantoprazole  (PROTONIX ) EC tablet 40 mg  40 mg Oral Daily Mannie Jerel PARAS, NP   40 mg at 05/19/24 0801   prazosin  (MINIPRESS ) capsule 5 mg  5 mg Oral QHS Montague, Crystal J, NP   5 mg at 05/18/24 2105   promethazine  (PHENERGAN ) 6.25 MG/5ML solution 12.5 mg  12.5 mg Oral Q6H PRN Jadapalle, Sree, MD       promethazine  (PHENERGAN ) tablet 25 mg  25 mg Oral Q6H PRN Hampton, Tracie B, NP   25 mg at 05/18/24 2106   propranolol  ER (INDERAL  LA) 24 hr capsule 60 mg  60 mg Oral Daily Mannie Jerel PARAS, NP   60 mg at 05/19/24 1045   venlafaxine  XR (EFFEXOR -XR) 24 hr capsule 150 mg  150 mg Oral Q breakfast Mannie Jerel PARAS, NP   150 mg at 05/19/24 9198    Lab Results: No results found for this or any previous visit (from the past 48 hours).  Blood Alcohol  level:  Lab Results  Component Value Date   ETH 185 (  H) 05/14/2024   ETH 330 (HH) 06/20/2023    Metabolic Disorder Labs: Lab Results  Component Value Date   HGBA1C 5.2 09/03/2021   MPG 102.54 09/03/2021   No results found for: PROLACTIN Lab Results  Component Value Date   CHOL 230 (H) 09/03/2021   TRIG 77 09/03/2021   HDL 130 09/03/2021   CHOLHDL 1.8 09/03/2021   VLDL 15 09/03/2021   LDLCALC 85 09/03/2021    Physical Findings: AIMS:  , ,  ,  ,    CIWA:  CIWA-Ar Total: 3 COWS:      Psychiatric Specialty Exam:  Presentation  General Appearance:  Appropriate for Environment; Casual  Eye Contact: Good  Speech: Clear and Coherent; Normal  Rate  Speech Volume: Normal    Mood and Affect  Mood: Euthymic  Affect: Congruent; Appropriate   Thought Process  Thought Processes: Coherent; Goal Directed; Linear  Orientation:Full (Time, Place and Person)  Thought Content:WDL  Hallucinations:Hallucinations: None  Ideas of Reference:None  Suicidal Thoughts:Suicidal Thoughts: No  Homicidal Thoughts:Homicidal Thoughts: No   Sensorium  Memory: Immediate Good; Recent Good; Remote Good  Judgment: Fair  Insight: Fair   Chartered Certified Accountant: Fair  Attention Span: Fair  Recall: Good  Fund of Knowledge: Good  Language: Good   Psychomotor Activity  Psychomotor Activity:Psychomotor Activity: Normal  Musculoskeletal: Strength & Muscle Tone: within normal limits Gait & Station: normal Assets  Assets: Manufacturing Systems Engineer; Housing; Health And Safety Inspector; Vocational/Educational; Social Support    Physical Exam: Physical Exam Vitals and nursing note reviewed.  Constitutional:      Appearance: Normal appearance.  Pulmonary:     Effort: Pulmonary effort is normal.  Neurological:     Mental Status: She is alert and oriented to person, place, and time.    Review of Systems  Constitutional: Negative.   HENT: Negative.    Eyes: Negative.   Respiratory: Negative.  Negative for shortness of breath.   Cardiovascular: Negative.  Negative for chest pain.  Gastrointestinal: Negative.  Negative for diarrhea, nausea and vomiting.  Genitourinary: Negative.   Musculoskeletal: Negative.   Skin: Negative.   Neurological: Negative.   Endo/Heme/Allergies: Negative.   Psychiatric/Behavioral:  Positive for substance abuse. Negative for depression, hallucinations and suicidal ideas. The patient is not nervous/anxious.   All other systems reviewed and are negative.  Blood pressure (!) 122/91, pulse (!) 102, temperature 98.1 F (36.7 C), temperature source Oral, resp. rate 18, height  5' 7 (1.702 m), weight 75.8 kg, SpO2 100%. Body mass index is 26.16 kg/m.  Diagnosis: Principal Problem:   MDD (major depressive disorder), recurrent severe, without psychosis (HCC)   PLAN: Safety and Monitoring:  -- Involuntary admission to inpatient psychiatric unit for safety, stabilization and treatment  -- Daily contact with patient to assess and evaluate symptoms and progress in treatment  -- Patient's case to be discussed in multi-disciplinary team meeting  -- Observation Level : q15 minute checks  -- Vital signs:  q12 hours  -- Precautions: suicide, elopement, and assault -- Encouraged patient to participate in unit milieu and in scheduled group therapies  2. Psychiatric Treatment:  Scheduled Medications: Abilify  5 mg Xanax  0.5 mg TID Gabapentin  800 mg BID, 1200 mg at night  Hydroxyzine  PRN    -- The risks/benefits/side-effects/alternatives to this medication were discussed in detail with the patient and time was given for questions. The patient consents to medication trial.  3. Medical Issues Being Addressed:  Losartan  100 mg daily   4. Discharge Planning:   --  Social work and case management to assist with discharge planning and identification of hospital follow-up needs prior to discharge  -- Estimated LOS: 3-4 days  Likely discharge tomorrow once appointments are confirmed  Jordan Dragoo, NP 05/19/2024, 1:51 PM

## 2024-05-19 NOTE — Plan of Care (Signed)
   Problem: Education: Goal: Emotional status will improve Outcome: Progressing Goal: Mental status will improve Outcome: Progressing Goal: Verbalization of understanding the information provided will improve Outcome: Progressing   Problem: Activity: Goal: Interest or engagement in activities will improve Outcome: Progressing

## 2024-05-19 NOTE — Progress Notes (Signed)
" °   05/18/24 2105  Psych Admission Type (Psych Patients Only)  Admission Status Involuntary  Psychosocial Assessment  Patient Complaints None  Eye Contact Fair  Facial Expression Flat  Affect Flat  Speech Logical/coherent  Interaction Assertive  Motor Activity Slow  Appearance/Hygiene Unremarkable  Behavior Characteristics Cooperative  Mood Pleasant  Thought Process  Coherency WDL  Content WDL  Delusions None reported or observed  Perception WDL  Hallucination None reported or observed  Judgment Impaired  Confusion None  Danger to Self  Current suicidal ideation? Denies  Danger to Others  Danger to Others None reported or observed    "

## 2024-05-19 NOTE — Group Note (Signed)
"                                                 [facility] LCSW Group Therapy Note    Group Date: 05/19/2024 Start Time: 1200 End Time: 1215  Type of Therapy and Topic:  Group Therapy:  Overcoming Obstacles  Participation Level:  [facility] PARTICIPATION LEVEL: Active  Mood:  Description of Group:   In this group patients will be encouraged to explore what they see as obstacles to their own wellness and recovery. They will be guided to discuss their thoughts, feelings, and behaviors related to these obstacles. The group will process together ways to cope with barriers, with attention given to specific choices patients can make. Each patient will be challenged to identify changes they are motivated to make in order to overcome their obstacles. This group will be process-oriented, with patients participating in exploration of their own experiences as well as giving and receiving support and challenge from other group members.  Therapeutic Goals: 1. Patient will identify personal and current obstacles as they relate to admission. 2. Patient will identify barriers that currently interfere with their wellness or overcoming obstacles.  3. Patient will identify feelings, thought process and behaviors related to these barriers. 4. Patient will identify two changes they are willing to make to overcome these obstacles:    Summary of Patient Progress   CSW had RN assist in printing group packet for patient.  Group packet provided due to inclement weather.    Therapeutic Modalities:   Cognitive Behavioral Therapy Solution Focused Therapy Motivational Interviewing Relapse Prevention Therapy   Sherryle JINNY Margo, LCSW "

## 2024-05-19 NOTE — Group Note (Signed)
 Date:  05/19/2024 Time:  8:52 PM  Group Topic/Focus:  Coping With Mental Health Crisis:   The purpose of this group is to help patients identify strategies for coping with mental health crisis.  Group discusses possible causes of crisis and ways to manage them effectively.    Participation Level:  Active  Participation Quality:  Appropriate  Affect:  Appropriate  Cognitive:  Appropriate  Insight: Good  Engagement in Group:  Engaged  Modes of Intervention:  Support  Additional Comments:    Jordan Foster 05/19/2024, 8:52 PM

## 2024-05-19 NOTE — Group Note (Signed)
 Date:  05/19/2024 Time:  10:38 AM  Group Topic/Focus:  Orientation:   The focus of this group is to educate the patient on the purpose and policies of crisis stabilization and provide a format to answer questions about their admission.  The group details unit policies and expectations of patients while admitted.    Participation Level:  Did Not Attend   Jordan Foster June 05/19/2024, 10:38 AM

## 2024-05-20 MED ORDER — NICOTINE 21 MG/24HR TD PT24: 21.0000 mg | MEDICATED_PATCH | Freq: Every day | TRANSDERMAL | Status: DC

## 2024-05-20 MED ORDER — NICOTINE 21 MG/24HR TD PT24
21.0000 mg | MEDICATED_PATCH | Freq: Every day | TRANSDERMAL | Status: DC
  Administered 2024-05-20: 21 mg via TRANSDERMAL
  Filled 2024-05-20: qty 1

## 2024-05-20 NOTE — Group Note (Signed)
 Date:  05/20/2024 Time:  9:55 AM  Group Topic/Focus:  Orientation:   The focus of this group is to educate the patient on the purpose and policies of crisis stabilization and provide a format to answer questions about their admission.  The group details unit policies and expectations of patients while admitted.    Participation Level:  Active  Participation Quality:  Appropriate  Affect:  Appropriate  Cognitive:  Appropriate  Insight: Appropriate  Engagement in Group:  Engaged  Modes of Intervention:  Activity  Additional Comments:    Jordan Foster June 05/20/2024, 9:55 AM

## 2024-05-20 NOTE — Plan of Care (Signed)
   Problem: Education: Goal: Knowledge of Jordan Foster General Education information/materials will improve Outcome: Progressing Goal: Emotional status will improve Outcome: Progressing Goal: Mental status will improve Outcome: Progressing Goal: Verbalization of understanding the information provided will improve Outcome: Progressing   Problem: Activity: Goal: Interest or engagement in activities will improve Outcome: Progressing Goal: Sleeping patterns will improve Outcome: Progressing   Problem: Coping: Goal: Ability to verbalize frustrations and anger appropriately will improve Outcome: Progressing Goal: Ability to demonstrate self-control will improve Outcome: Progressing

## 2024-05-20 NOTE — Progress Notes (Signed)
" °  [facility] Adult Case Management Discharge Plan :  Will you be returning to the same living situation after discharge:  Yes,  Patient to return home.  At discharge, do you have transportation home?: Yes,  CSW has arranged taxi services on patient's behalf.  Do you have the ability to pay for your medications: Yes,  [employer]  Release of information consent forms completed and in the chart;  Patient's signature needed at discharge.  Patient to Follow up at:  Follow-up Information     [facility] Follow up.   Why: Appointment is scheduled for 05/22/2024 at 2:45PM.  Appointment is virtual. Contact information: [address] [place] [address] [phone]                 Next level of care provider has access to [facility] Link:no  Safety Planning and Suicide Prevention discussed: Yes,  SPE completed with patient as CSW was unable to make successful contact with  Warren SQUIBB., [phone], Friend.     Has patient been referred to the Quitline?: Patient refused referral for treatment  Patient has been referred for addiction treatment: No known substance use disorder.  Alveta CHRISTELLA Kerns, LCSW 05/20/2024, 9:56 AM "

## 2024-05-20 NOTE — Group Note (Signed)
 Recreation Therapy Group Note   Group Topic:Coping Skills  Group Date: 05/20/2024 Start Time: 1000 End Time: 1040 Facilitators: Celestia Jeoffrey BRAVO, LRT, CTRS Location: Craft Room  Group Description: Mind Map.  Patient was provided a blank template of a diagram with 32 blank boxes in a tiered system, branching from the center (similar to a bubble chart). LRT directed patients to label the middle of the diagram Coping Skills. LRT and patients then came up with 8 different coping skills as examples. Pt were directed to record their coping skills in the 2nd tier boxes closest to the center.  Patients would then share their coping skills with the group as LRT wrote them out. LRT gave a handout of 99 different coping skills at the end of group.   Goal Area(s) Addressed: Patients will be able to define coping skills. Patient will identify new coping skills.  Patient will increase communication.   Affect/Mood: N/A   Participation Level: Did not attend    Clinical Observations/Individualized Feedback: Patient did not attend.  Plan: Continue to engage patient in RT group sessions 2-3x/week.   Jeoffrey BRAVO Celestia, LRT, CTRS 05/20/2024 10:43 AM

## 2024-05-20 NOTE — Group Note (Signed)
 LCSW Group Therapy Note  Group Date: 05/20/2024 Start Time: 1210 End Time: 1300   Type of Therapy and Topic:  Group Therapy: Positive Affirmations  Participation Level:  Did Not Attend   Description of Group:   This group addressed positive affirmation towards self and others.  Patients went around the room and identified two positive things about themselves and two positive things about a peer in the room.  Patients reflected on how it felt to share something positive with others, to identify positive things about themselves, and to hear positive things from others/ Patients were encouraged to have a daily reflection of positive characteristics or circumstances.   Therapeutic Goals: Patients will verbalize two of their positive qualities Patients will demonstrate empathy for others by stating two positive qualities about a peer in the group Patients will verbalize their feelings when voicing positive self affirmations and when voicing positive affirmations of others Patients will discuss the potential positive impact on their wellness/recovery of focusing on positive traits of self and others.  Summary of Patient Progress:  Patient did not attend.   Therapeutic Modalities:   Cognitive Behavioral Therapy Motivational Interviewing    Jordan Foster, LCSWA 05/20/2024  2:05 PM

## 2024-05-20 NOTE — [facility] Suicide Risk Assessment (Signed)
 [facility] INPATIENT:  Family/Significant Other Suicide Prevention Education   [facility] INPATIENT:  Family/Significant Other Suicide Prevention Education   Suicide Prevention Education:  Contact Attempts: Jordan Foster [phone],  has been identified by the patient as the family member/significant other with whom the patient will be residing, and identified as the person(s) who will aid the patient in the event of a mental health crisis.  With written consent from the patient, two attempts were made to provide suicide prevention education, prior to and/or following the patient's discharge.  We were unsuccessful in providing suicide prevention education.  A suicide education pamphlet was given to the patient to share with family/significant other.   Date and time of first attempt: 05/19/2024 at 3:05PM  Date and time of second attempt: 05/21/24 at 9:39 AM.    CSW left HIPAA compliant voicemail requesting a return phone call.  CSW was unable to establish successful contact after multiple attempts. A HIPAA-compliant voicemail was left. The patient was informed of the CSWs contact attempts. CSW will make additional attempts at the teams request or if any safety concerns arise and will attempt contact again on the day of discharge.   Teea Ducey, MSW, LCSWA 05/20/2024 9:40 AM

## 2024-05-20 NOTE — Discharge Instructions (Signed)
 Discharge recommendations:   Medications: Patient is to take medications as prescribed. The patient or patient's guardian is to contact a medical professional and/or outpatient provider to address any new side effects that develop. The patient or the patient's guardian should update outpatient providers of any new medications and/or medication changes.    Outpatient Follow up: Please review list of outpatient resources for psychiatry and counseling. Please follow up with your primary care provider for all medical related needs.    Therapy: We recommend that patient participate in individual therapy to address mental health concerns.   Atypical antipsychotics: If you are prescribed an atypical antipsychotic, it is recommended that your height, weight, BMI, blood pressure, fasting lipid panel, and fasting blood sugar be monitored by your outpatient providers.  Safety:   The following safety precautions should be taken:   No sharp objects. This includes scissors, razors, scrapers, and putty knives.   Chemicals should be removed and locked up.   Medications should be removed and locked up.   Weapons should be removed and locked up. This includes firearms, knives and instruments that can be used to cause injury.   The patient should abstain from use of illicit substances/drugs and abuse of any medications.  If symptoms worsen or do not continue to improve or if the patient becomes actively suicidal or homicidal then it is recommended that the patient return to the closest hospital emergency department, the [facility], or call 911 for further evaluation and treatment. National Suicide Prevention Lifeline 1-800-SUICIDE or (410)126-1656.  About 988 988 offers 24/7 access to trained crisis counselors who can help people experiencing mental health-related distress. People can call or text 988 or chat 988lifeline.org for themselves or if they are worried about a  loved one who Khyron Garno need crisis support.   Smart Recovery Meetings  https://smartrecovery.org/smart-recovery-programs

## 2024-05-20 NOTE — Plan of Care (Signed)
 Pt discharged, discharged instructions given. Belongings verified and given. Patient en route via a taxi. Problem: Education: Goal: Knowledge of [facility] Education information/materials will improve Outcome: Adequate for Discharge Goal: Emotional status will improve Outcome: Adequate for Discharge Goal: Mental status will improve Outcome: Adequate for Discharge Goal: Verbalization of understanding the information provided will improve Outcome: Adequate for Discharge   Problem: Activity: Goal: Interest or engagement in activities will improve Outcome: Adequate for Discharge Goal: Sleeping patterns will improve Outcome: Adequate for Discharge   Problem: Coping: Goal: Ability to verbalize frustrations and anger appropriately will improve Outcome: Adequate for Discharge Goal: Ability to demonstrate self-control will improve Outcome: Adequate for Discharge   Problem: Health Behavior/Discharge Planning: Goal: Identification of resources available to assist in meeting health care needs will improve Outcome: Adequate for Discharge Goal: Compliance with treatment plan for underlying cause of condition will improve Outcome: Adequate for Discharge   Problem: Physical Regulation: Goal: Ability to maintain clinical measurements within normal limits will improve Outcome: Adequate for Discharge   Problem: Safety: Goal: Periods of time without injury will increase Outcome: Adequate for Discharge   Problem: Education: Goal: Ability to make informed decisions regarding treatment will improve Outcome: Adequate for Discharge   Problem: Medication: Goal: Compliance with prescribed medication regimen will improve Outcome: Adequate for Discharge   Problem: Self-Concept: Goal: Ability to disclose and discuss suicidal ideas will improve Outcome: Adequate for Discharge Goal: Will verbalize positive feelings about self Outcome: Adequate for Discharge Note:     Problem:  Education: Goal: Knowledge of [facility] Education information/materials will improve Outcome: Adequate for Discharge Goal: Emotional status will improve Outcome: Adequate for Discharge Goal: Mental status will improve Outcome: Adequate for Discharge Goal: Verbalization of understanding the information provided will improve Outcome: Adequate for Discharge   Problem: Safety: Goal: Periods of time without injury will increase Outcome: Adequate for Discharge   Problem: Health Behavior/Discharge Planning: Goal: Ability to identify changes in lifestyle to reduce recurrence of condition will improve Outcome: Adequate for Discharge Goal: Identification of resources available to assist in meeting health care needs will improve Outcome: Adequate for Discharge   Problem: Physical Regulation: Goal: Complications related to the disease process, condition or treatment will be avoided or minimized Outcome: Adequate for Discharge   Problem: Safety: Goal: Ability to remain free from injury will improve Outcome: Adequate for Discharge

## 2024-05-20 NOTE — Progress Notes (Signed)
 Jordan Foster, father, [phone]  Father reports that he has to come this way to bring her dog back to her. He endorses it is about 1 hour and 20 minutes of a drive. He is willing to pick her up once discharged.   Endorses that he secured the gun that the police gave him and plans to continue to hold onto it until her outpatient provider can indicate that she is safe enough to get it back.  Father reports that he has spoken with her since being here every day and he feels she is doing better due to the inpatient treatment. He is hoping she will go back to AA and increase her supports. 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   He denies any safety concerns with discharge at this time.   Glenys Beal, NP
